# Patient Record
Sex: Male | Born: 1945
Health system: Southern US, Community
[De-identification: ages and names within clinical notes are randomized; demographics above are authoritative.]

## PROBLEM LIST (undated history)

## (undated) ENCOUNTER — Ambulatory Visit: Admission: EM | Source: Home / Self Care

## (undated) DIAGNOSIS — I491 Atrial premature depolarization: Secondary | ICD-10-CM

## (undated) DIAGNOSIS — N529 Male erectile dysfunction, unspecified: Secondary | ICD-10-CM

## (undated) DIAGNOSIS — I499 Cardiac arrhythmia, unspecified: Secondary | ICD-10-CM

## (undated) DIAGNOSIS — I219 Acute myocardial infarction, unspecified: Secondary | ICD-10-CM

## (undated) DIAGNOSIS — I10 Essential (primary) hypertension: Secondary | ICD-10-CM

## (undated) DIAGNOSIS — L570 Actinic keratosis: Secondary | ICD-10-CM

## (undated) DIAGNOSIS — H9190 Unspecified hearing loss, unspecified ear: Secondary | ICD-10-CM

## (undated) DIAGNOSIS — E785 Hyperlipidemia, unspecified: Secondary | ICD-10-CM

## (undated) DIAGNOSIS — I2699 Other pulmonary embolism without acute cor pulmonale: Secondary | ICD-10-CM

## (undated) DIAGNOSIS — C4490 Unspecified malignant neoplasm of skin, unspecified: Secondary | ICD-10-CM

## (undated) DIAGNOSIS — N400 Enlarged prostate without lower urinary tract symptoms: Secondary | ICD-10-CM

## (undated) DIAGNOSIS — K219 Gastro-esophageal reflux disease without esophagitis: Secondary | ICD-10-CM

## (undated) HISTORY — DX: Essential (primary) hypertension: I10

## (undated) HISTORY — DX: Hyperlipidemia, unspecified: E78.5

## (undated) HISTORY — DX: Benign prostatic hyperplasia without lower urinary tract symptoms: N40.0

## (undated) HISTORY — DX: Male erectile dysfunction, unspecified: N52.9

## (undated) HISTORY — DX: Actinic keratosis: L57.0

## (undated) HISTORY — DX: Unspecified hearing loss, unspecified ear: H91.90

## (undated) HISTORY — DX: Gastro-esophageal reflux disease without esophagitis: K21.9

## (undated) HISTORY — PX: INGUINAL HERNIA REPAIR: SUR1180

---

## 2001-08-29 ENCOUNTER — Encounter: Payer: Self-pay | Admitting: Orthopedic Surgery

## 2001-08-29 ENCOUNTER — Ambulatory Visit (HOSPITAL_COMMUNITY): Admission: RE | Admit: 2001-08-29 | Discharge: 2001-08-29 | Payer: Self-pay | Admitting: Orthopedic Surgery

## 2003-02-15 ENCOUNTER — Emergency Department (HOSPITAL_COMMUNITY): Admission: EM | Admit: 2003-02-15 | Discharge: 2003-02-15 | Payer: Self-pay | Admitting: Emergency Medicine

## 2006-01-17 ENCOUNTER — Encounter: Admission: RE | Admit: 2006-01-17 | Discharge: 2006-01-17 | Payer: Self-pay | Admitting: Neurosurgery

## 2007-06-06 ENCOUNTER — Encounter: Admission: RE | Admit: 2007-06-06 | Discharge: 2007-06-06 | Payer: Self-pay | Admitting: Internal Medicine

## 2011-06-14 DIAGNOSIS — I10 Essential (primary) hypertension: Secondary | ICD-10-CM | POA: Diagnosis not present

## 2011-06-14 DIAGNOSIS — E7800 Pure hypercholesterolemia, unspecified: Secondary | ICD-10-CM | POA: Diagnosis not present

## 2011-06-14 DIAGNOSIS — Z79899 Other long term (current) drug therapy: Secondary | ICD-10-CM | POA: Diagnosis not present

## 2011-06-25 DIAGNOSIS — H35039 Hypertensive retinopathy, unspecified eye: Secondary | ICD-10-CM | POA: Diagnosis not present

## 2011-07-12 DIAGNOSIS — G4700 Insomnia, unspecified: Secondary | ICD-10-CM | POA: Diagnosis not present

## 2011-07-12 DIAGNOSIS — I10 Essential (primary) hypertension: Secondary | ICD-10-CM | POA: Diagnosis not present

## 2011-09-29 DIAGNOSIS — I10 Essential (primary) hypertension: Secondary | ICD-10-CM | POA: Diagnosis not present

## 2011-09-29 DIAGNOSIS — E7800 Pure hypercholesterolemia, unspecified: Secondary | ICD-10-CM | POA: Diagnosis not present

## 2011-09-29 DIAGNOSIS — Z79899 Other long term (current) drug therapy: Secondary | ICD-10-CM | POA: Diagnosis not present

## 2011-10-01 DIAGNOSIS — H00039 Abscess of eyelid unspecified eye, unspecified eyelid: Secondary | ICD-10-CM | POA: Diagnosis not present

## 2011-10-11 DIAGNOSIS — R319 Hematuria, unspecified: Secondary | ICD-10-CM | POA: Diagnosis not present

## 2011-10-13 DIAGNOSIS — I10 Essential (primary) hypertension: Secondary | ICD-10-CM | POA: Diagnosis not present

## 2011-10-20 DIAGNOSIS — I10 Essential (primary) hypertension: Secondary | ICD-10-CM | POA: Diagnosis not present

## 2011-11-03 DIAGNOSIS — I10 Essential (primary) hypertension: Secondary | ICD-10-CM | POA: Diagnosis not present

## 2011-11-03 DIAGNOSIS — G4700 Insomnia, unspecified: Secondary | ICD-10-CM | POA: Diagnosis not present

## 2012-02-03 DIAGNOSIS — G4700 Insomnia, unspecified: Secondary | ICD-10-CM | POA: Diagnosis not present

## 2012-02-03 DIAGNOSIS — I10 Essential (primary) hypertension: Secondary | ICD-10-CM | POA: Diagnosis not present

## 2012-02-03 DIAGNOSIS — J385 Laryngeal spasm: Secondary | ICD-10-CM | POA: Diagnosis not present

## 2012-02-28 DIAGNOSIS — C44320 Squamous cell carcinoma of skin of unspecified parts of face: Secondary | ICD-10-CM | POA: Diagnosis not present

## 2012-02-28 DIAGNOSIS — L910 Hypertrophic scar: Secondary | ICD-10-CM | POA: Diagnosis not present

## 2012-02-28 DIAGNOSIS — D485 Neoplasm of uncertain behavior of skin: Secondary | ICD-10-CM | POA: Diagnosis not present

## 2012-02-28 DIAGNOSIS — L821 Other seborrheic keratosis: Secondary | ICD-10-CM | POA: Diagnosis not present

## 2012-02-28 DIAGNOSIS — L570 Actinic keratosis: Secondary | ICD-10-CM | POA: Diagnosis not present

## 2012-03-27 DIAGNOSIS — C44320 Squamous cell carcinoma of skin of unspecified parts of face: Secondary | ICD-10-CM | POA: Diagnosis not present

## 2012-06-02 DIAGNOSIS — K219 Gastro-esophageal reflux disease without esophagitis: Secondary | ICD-10-CM | POA: Diagnosis not present

## 2012-06-02 DIAGNOSIS — Z79899 Other long term (current) drug therapy: Secondary | ICD-10-CM | POA: Diagnosis not present

## 2012-06-02 DIAGNOSIS — H9190 Unspecified hearing loss, unspecified ear: Secondary | ICD-10-CM | POA: Diagnosis not present

## 2012-06-02 DIAGNOSIS — I10 Essential (primary) hypertension: Secondary | ICD-10-CM | POA: Diagnosis not present

## 2012-06-02 DIAGNOSIS — E7800 Pure hypercholesterolemia, unspecified: Secondary | ICD-10-CM | POA: Diagnosis not present

## 2012-06-02 DIAGNOSIS — M25519 Pain in unspecified shoulder: Secondary | ICD-10-CM | POA: Diagnosis not present

## 2012-06-02 DIAGNOSIS — G4700 Insomnia, unspecified: Secondary | ICD-10-CM | POA: Diagnosis not present

## 2012-06-02 DIAGNOSIS — Z0000 Encounter for general adult medical examination without abnormal findings: Secondary | ICD-10-CM | POA: Diagnosis not present

## 2012-06-02 DIAGNOSIS — Z125 Encounter for screening for malignant neoplasm of prostate: Secondary | ICD-10-CM | POA: Diagnosis not present

## 2013-06-08 ENCOUNTER — Other Ambulatory Visit: Payer: Self-pay | Admitting: Internal Medicine

## 2013-06-08 ENCOUNTER — Ambulatory Visit
Admission: RE | Admit: 2013-06-08 | Discharge: 2013-06-08 | Disposition: A | Discharge status: Home / Self Care | Payer: Medicare Other | Source: Ambulatory Visit | Attending: Internal Medicine | Admitting: Internal Medicine

## 2013-06-08 DIAGNOSIS — Z0000 Encounter for general adult medical examination without abnormal findings: Secondary | ICD-10-CM | POA: Diagnosis not present

## 2013-06-08 DIAGNOSIS — Z1331 Encounter for screening for depression: Secondary | ICD-10-CM | POA: Diagnosis not present

## 2013-06-08 DIAGNOSIS — R071 Chest pain on breathing: Secondary | ICD-10-CM | POA: Diagnosis not present

## 2013-06-08 DIAGNOSIS — E7800 Pure hypercholesterolemia, unspecified: Secondary | ICD-10-CM | POA: Diagnosis not present

## 2013-06-08 DIAGNOSIS — I10 Essential (primary) hypertension: Secondary | ICD-10-CM | POA: Diagnosis not present

## 2013-06-08 DIAGNOSIS — R0789 Other chest pain: Secondary | ICD-10-CM

## 2013-06-08 DIAGNOSIS — N400 Enlarged prostate without lower urinary tract symptoms: Secondary | ICD-10-CM | POA: Diagnosis not present

## 2013-06-08 DIAGNOSIS — N529 Male erectile dysfunction, unspecified: Secondary | ICD-10-CM | POA: Diagnosis not present

## 2013-06-08 DIAGNOSIS — K219 Gastro-esophageal reflux disease without esophagitis: Secondary | ICD-10-CM | POA: Diagnosis not present

## 2013-06-08 DIAGNOSIS — G4700 Insomnia, unspecified: Secondary | ICD-10-CM | POA: Diagnosis not present

## 2013-06-08 DIAGNOSIS — R079 Chest pain, unspecified: Secondary | ICD-10-CM | POA: Diagnosis not present

## 2013-08-08 DIAGNOSIS — J111 Influenza due to unidentified influenza virus with other respiratory manifestations: Secondary | ICD-10-CM | POA: Diagnosis not present

## 2013-08-08 DIAGNOSIS — J069 Acute upper respiratory infection, unspecified: Secondary | ICD-10-CM | POA: Diagnosis not present

## 2013-09-06 ENCOUNTER — Encounter: Payer: Self-pay | Admitting: *Deleted

## 2014-04-01 DIAGNOSIS — K645 Perianal venous thrombosis: Secondary | ICD-10-CM | POA: Diagnosis not present

## 2014-04-01 DIAGNOSIS — S335XXA Sprain of ligaments of lumbar spine, initial encounter: Secondary | ICD-10-CM | POA: Diagnosis not present

## 2014-04-01 DIAGNOSIS — N419 Inflammatory disease of prostate, unspecified: Secondary | ICD-10-CM | POA: Diagnosis not present

## 2014-10-29 DIAGNOSIS — J069 Acute upper respiratory infection, unspecified: Secondary | ICD-10-CM | POA: Diagnosis not present

## 2014-11-18 DIAGNOSIS — R062 Wheezing: Secondary | ICD-10-CM | POA: Diagnosis not present

## 2014-11-18 DIAGNOSIS — R0789 Other chest pain: Secondary | ICD-10-CM | POA: Diagnosis not present

## 2014-11-18 DIAGNOSIS — J189 Pneumonia, unspecified organism: Secondary | ICD-10-CM | POA: Diagnosis not present

## 2014-11-18 DIAGNOSIS — R05 Cough: Secondary | ICD-10-CM | POA: Diagnosis not present

## 2014-11-22 DIAGNOSIS — L570 Actinic keratosis: Secondary | ICD-10-CM | POA: Diagnosis not present

## 2014-11-22 DIAGNOSIS — J189 Pneumonia, unspecified organism: Secondary | ICD-10-CM | POA: Diagnosis not present

## 2014-11-22 DIAGNOSIS — J9801 Acute bronchospasm: Secondary | ICD-10-CM | POA: Diagnosis not present

## 2014-11-22 DIAGNOSIS — M7580 Other shoulder lesions, unspecified shoulder: Secondary | ICD-10-CM | POA: Diagnosis not present

## 2014-11-26 ENCOUNTER — Ambulatory Visit
Admission: RE | Admit: 2014-11-26 | Discharge: 2014-11-26 | Disposition: A | Discharge status: Home / Self Care | Payer: Medicare Other | Source: Ambulatory Visit | Attending: Internal Medicine | Admitting: Internal Medicine

## 2014-11-26 ENCOUNTER — Other Ambulatory Visit: Payer: Self-pay | Admitting: Internal Medicine

## 2014-11-26 DIAGNOSIS — J9801 Acute bronchospasm: Secondary | ICD-10-CM | POA: Diagnosis not present

## 2014-11-26 DIAGNOSIS — R05 Cough: Secondary | ICD-10-CM | POA: Diagnosis not present

## 2014-11-26 DIAGNOSIS — J690 Pneumonitis due to inhalation of food and vomit: Secondary | ICD-10-CM

## 2014-11-26 DIAGNOSIS — J189 Pneumonia, unspecified organism: Secondary | ICD-10-CM | POA: Diagnosis not present

## 2014-11-26 DIAGNOSIS — L570 Actinic keratosis: Secondary | ICD-10-CM | POA: Diagnosis not present

## 2014-11-26 DIAGNOSIS — M7580 Other shoulder lesions, unspecified shoulder: Secondary | ICD-10-CM | POA: Diagnosis not present

## 2015-01-14 DIAGNOSIS — M19041 Primary osteoarthritis, right hand: Secondary | ICD-10-CM | POA: Diagnosis not present

## 2015-01-14 DIAGNOSIS — M7541 Impingement syndrome of right shoulder: Secondary | ICD-10-CM | POA: Diagnosis not present

## 2015-01-14 DIAGNOSIS — M79641 Pain in right hand: Secondary | ICD-10-CM | POA: Diagnosis not present

## 2015-03-06 DIAGNOSIS — I10 Essential (primary) hypertension: Secondary | ICD-10-CM | POA: Diagnosis not present

## 2015-03-06 DIAGNOSIS — Z125 Encounter for screening for malignant neoplasm of prostate: Secondary | ICD-10-CM | POA: Diagnosis not present

## 2015-03-06 DIAGNOSIS — J9801 Acute bronchospasm: Secondary | ICD-10-CM | POA: Diagnosis not present

## 2015-03-06 DIAGNOSIS — N400 Enlarged prostate without lower urinary tract symptoms: Secondary | ICD-10-CM | POA: Diagnosis not present

## 2015-03-06 DIAGNOSIS — Z23 Encounter for immunization: Secondary | ICD-10-CM | POA: Diagnosis not present

## 2015-03-06 DIAGNOSIS — L570 Actinic keratosis: Secondary | ICD-10-CM | POA: Diagnosis not present

## 2015-04-02 DIAGNOSIS — R972 Elevated prostate specific antigen [PSA]: Secondary | ICD-10-CM | POA: Diagnosis not present

## 2015-08-29 DIAGNOSIS — L570 Actinic keratosis: Secondary | ICD-10-CM | POA: Diagnosis not present

## 2015-08-29 DIAGNOSIS — G4700 Insomnia, unspecified: Secondary | ICD-10-CM | POA: Diagnosis not present

## 2015-08-29 DIAGNOSIS — I10 Essential (primary) hypertension: Secondary | ICD-10-CM | POA: Diagnosis not present

## 2015-08-29 DIAGNOSIS — K219 Gastro-esophageal reflux disease without esophagitis: Secondary | ICD-10-CM | POA: Diagnosis not present

## 2016-02-17 DIAGNOSIS — Z7189 Other specified counseling: Secondary | ICD-10-CM | POA: Diagnosis not present

## 2016-02-17 DIAGNOSIS — H9190 Unspecified hearing loss, unspecified ear: Secondary | ICD-10-CM | POA: Diagnosis not present

## 2016-02-17 DIAGNOSIS — L570 Actinic keratosis: Secondary | ICD-10-CM | POA: Diagnosis not present

## 2016-02-17 DIAGNOSIS — Z23 Encounter for immunization: Secondary | ICD-10-CM | POA: Diagnosis not present

## 2016-02-17 DIAGNOSIS — Z6827 Body mass index (BMI) 27.0-27.9, adult: Secondary | ICD-10-CM | POA: Diagnosis not present

## 2016-02-17 DIAGNOSIS — Z0000 Encounter for general adult medical examination without abnormal findings: Secondary | ICD-10-CM | POA: Diagnosis not present

## 2016-02-17 DIAGNOSIS — Z1389 Encounter for screening for other disorder: Secondary | ICD-10-CM | POA: Diagnosis not present

## 2016-02-17 DIAGNOSIS — N400 Enlarged prostate without lower urinary tract symptoms: Secondary | ICD-10-CM | POA: Diagnosis not present

## 2016-02-17 DIAGNOSIS — K219 Gastro-esophageal reflux disease without esophagitis: Secondary | ICD-10-CM | POA: Diagnosis not present

## 2016-02-17 DIAGNOSIS — G4700 Insomnia, unspecified: Secondary | ICD-10-CM | POA: Diagnosis not present

## 2016-02-17 DIAGNOSIS — E7800 Pure hypercholesterolemia, unspecified: Secondary | ICD-10-CM | POA: Diagnosis not present

## 2016-02-17 DIAGNOSIS — Z1211 Encounter for screening for malignant neoplasm of colon: Secondary | ICD-10-CM | POA: Diagnosis not present

## 2016-02-17 DIAGNOSIS — I10 Essential (primary) hypertension: Secondary | ICD-10-CM | POA: Diagnosis not present

## 2016-02-17 DIAGNOSIS — Z79899 Other long term (current) drug therapy: Secondary | ICD-10-CM | POA: Diagnosis not present

## 2016-02-17 DIAGNOSIS — R972 Elevated prostate specific antigen [PSA]: Secondary | ICD-10-CM | POA: Diagnosis not present

## 2016-03-31 DIAGNOSIS — Z1212 Encounter for screening for malignant neoplasm of rectum: Secondary | ICD-10-CM | POA: Diagnosis not present

## 2016-03-31 DIAGNOSIS — Z1211 Encounter for screening for malignant neoplasm of colon: Secondary | ICD-10-CM | POA: Diagnosis not present

## 2016-05-14 DIAGNOSIS — F4321 Adjustment disorder with depressed mood: Secondary | ICD-10-CM | POA: Diagnosis not present

## 2016-05-14 DIAGNOSIS — K625 Hemorrhage of anus and rectum: Secondary | ICD-10-CM | POA: Diagnosis not present

## 2016-05-14 DIAGNOSIS — R1032 Left lower quadrant pain: Secondary | ICD-10-CM | POA: Diagnosis not present

## 2016-08-10 DIAGNOSIS — M546 Pain in thoracic spine: Secondary | ICD-10-CM | POA: Diagnosis not present

## 2016-08-10 DIAGNOSIS — M549 Dorsalgia, unspecified: Secondary | ICD-10-CM | POA: Diagnosis not present

## 2016-08-12 DIAGNOSIS — R091 Pleurisy: Secondary | ICD-10-CM | POA: Diagnosis not present

## 2016-08-12 DIAGNOSIS — R0781 Pleurodynia: Secondary | ICD-10-CM | POA: Diagnosis not present

## 2016-08-12 DIAGNOSIS — L570 Actinic keratosis: Secondary | ICD-10-CM | POA: Diagnosis not present

## 2016-09-22 ENCOUNTER — Encounter (INDEPENDENT_AMBULATORY_CARE_PROVIDER_SITE_OTHER): Payer: Self-pay | Admitting: Orthopedic Surgery

## 2016-09-22 ENCOUNTER — Ambulatory Visit (INDEPENDENT_AMBULATORY_CARE_PROVIDER_SITE_OTHER): Payer: Medicare Other | Admitting: Orthopedic Surgery

## 2016-09-22 ENCOUNTER — Encounter (INDEPENDENT_AMBULATORY_CARE_PROVIDER_SITE_OTHER): Payer: Self-pay

## 2016-09-22 ENCOUNTER — Ambulatory Visit (INDEPENDENT_AMBULATORY_CARE_PROVIDER_SITE_OTHER): Payer: Medicare Other

## 2016-09-22 VITALS — BP 121/84 | HR 80 | Resp 16 | Ht 69.0 in | Wt 185.0 lb

## 2016-09-22 DIAGNOSIS — M25562 Pain in left knee: Secondary | ICD-10-CM | POA: Diagnosis not present

## 2016-09-22 DIAGNOSIS — M1712 Unilateral primary osteoarthritis, left knee: Secondary | ICD-10-CM

## 2016-09-22 MED ORDER — BUPIVACAINE HCL 0.5 % IJ SOLN
3.0000 mL | INTRAMUSCULAR | Status: AC | PRN
  Administered 2016-09-22: 3 mL via INTRA_ARTICULAR

## 2016-09-22 MED ORDER — DICLOFENAC SODIUM 1 % TD GEL: 2.0000 g | Freq: Four times a day (QID) | TRANSDERMAL | 2 refills | Status: DC

## 2016-09-22 MED ORDER — METHYLPREDNISOLONE ACETATE 40 MG/ML IJ SUSP
80.0000 mg | INTRAMUSCULAR | Status: AC | PRN
  Administered 2016-09-22: 80 mg

## 2016-09-22 MED ORDER — LIDOCAINE HCL 1 % IJ SOLN
5.0000 mL | INTRAMUSCULAR | Status: AC | PRN
  Administered 2016-09-22: 5 mL

## 2016-09-22 NOTE — Progress Notes (Signed)
Office Visit Note   Patient: Raymond Stanley           Date of Birth: 1946-05-13           MRN: [id number] Visit Date: 09/22/2016              Requested by: No referring provider defined for this encounter. PCP: Pcp Not In System   Assessment & Plan: Visit Diagnoses:  1. Acute pain of left knee   2. Unilateral primary osteoarthritis, left knee     Plan:  #1: Corticosteroid injection to the left knee without difficulty #2: Limit activities today and tomorrow #3: Prescription for Voltaren gel was given  Follow-Up Instructions: Return if symptoms worsen or fail to improve.   Orders:  Orders Placed This Encounter  Procedures  . XR Knee Complete 4 Views Left   Meds ordered this encounter  Medications  . diclofenac sodium (VOLTAREN) 1 % GEL    Sig: Apply 2-4 g topically 4 (four) times daily.    Dispense:  5 Tube    Refill:  2    Order Specific Question:   Supervising Provider    Answer:   Garald Balding [[id number]]      Procedures: Large Joint Inj Date/Time: 09/22/2016 3:46 PM Performed by: Biagio Borg D Authorized by: Biagio Borg D   Consent Given by:  Patient Timeout: prior to procedure the correct patient, procedure, and site was verified   Indications:  Pain and joint swelling Location:  Knee Site:  L knee Prep: patient was prepped and draped in usual sterile fashion   Needle Size:  25 G Needle Length:  1.5 inches Approach:  Anteromedial Ultrasound Guidance: No   Fluoroscopic Guidance: No   Arthrogram: No   Medications:  5 mL lidocaine 1 %; 80 mg methylPREDNISolone acetate 40 MG/ML; 3 mL bupivacaine 0.5 % Aspiration Attempted: No   Patient tolerance:  Patient tolerated the procedure well with no immediate complications     Clinical Data: No additional findings.   Subjective: Chief Complaint  Patient presents with  . Left Knee - Pain  . Pain    nki-just started hurting, was working in shop mainly on floor, had some swelling in it, used ice  on it.  This has been going for about 2 weeks now.     HPI  Raymond Stanley is a very pleasant 71 year old white male who is seen today for evaluation of his left knee. He states that he has been working in his shop and had been doing a lot of kneeling about 2-3 weeks ago. He has been working on a project now 2 revealed a garage. He is a very active man. He states that the knee became swollen and he used Ace wrap as well as ice and has had the knee at least better control but still is having this pain in the medial joint space. He denies catching, locking, or giving way at this time. At this time is mainly pain mediated medially.  Review of Systems  Cardiovascular:       History of hypertension  All other systems reviewed and are negative.    Objective: Vital Signs: BP 121/84 (BP Location: Left Arm, Patient Position: Sitting)   Pulse 80   Resp 16   Ht 5\' 9"  (1.753 m)   Wt 185 lb (83.9 kg)   BMI 27.32 kg/m   Physical Exam  Constitutional: He is oriented to person, place, and time. He appears well-developed and well-nourished.  HENT:  Head: Normocephalic and atraumatic.  Eyes: EOM are normal. Pupils are equal, round, and reactive to light.  Pulmonary/Chest: Effort normal.  Neurological: He is alert and oriented to person, place, and time.  Skin: Skin is warm and dry.  Psychiatric: He has a normal mood and affect. His behavior is normal. Judgment and thought content normal.    Ortho Exam  He does have pain to palpation along the medial joint line. Minimal effusion at best. Little bit of crepitus with range of motion. McMurray's is not particularly painful to him. He may open a millimeter or 2 with valgus stressing.   Specialty Comments:  No specialty comments available.  Imaging: Xr Knee Complete 4 Views Left  Result Date: 09/22/2016 Three-view x-ray of the left knee reveals medial joint space narrowing at least 50-60% in comparison to the right knee. Otherwise and he appears white good  for someone his age.    PMFS History: Patient Active Problem List   Diagnosis Date Noted  . Unilateral primary osteoarthritis, left knee 09/22/2016   Past Medical History:  Diagnosis Date  . Actinic keratoses   . BPH (benign prostatic hypertrophy)   . ED (erectile dysfunction)   . Esophageal reflux   . Hearing loss   . Other and unspecified hyperlipidemia   . Unspecified essential hypertension     Family History  Problem Relation Age of Onset  . Hypertension Father   . CAD Father   . Hypertension Mother   . CAD Mother   . Hypertension Maternal Grandmother   . CAD Maternal Grandmother   . Heart disease Brother   . Hypertension Brother     Past Surgical History:  Procedure Laterality Date  . INGUINAL HERNIA REPAIR Right    Social History   Occupational History  . Not on file.   Social History Main Topics  . Smoking status: Never Smoker  . Smokeless tobacco: Never Used  . Alcohol use Yes     Comment: occasional  . Drug use: No  . Sexual activity: Not on file

## 2016-09-29 ENCOUNTER — Telehealth (INDEPENDENT_AMBULATORY_CARE_PROVIDER_SITE_OTHER): Payer: Self-pay | Admitting: Orthopedic Surgery

## 2016-09-29 NOTE — Telephone Encounter (Signed)
Patient states insurance company is requiring preauthorization for Voltaren creme.  [employer] # [phone]. Call patient to let know when authorized.

## 2016-09-29 NOTE — Telephone Encounter (Signed)
Patient says he spoke to his insurance company and the price doubled for the Voltaren gel since the rx was written for 31 days. Patient is requesting a new rx be sent to [facility] for a 90 day supply. Patient says to call him with any questions since he spoke to his insurance company.

## 2016-09-30 NOTE — Telephone Encounter (Signed)
done

## 2016-10-11 ENCOUNTER — Telehealth (INDEPENDENT_AMBULATORY_CARE_PROVIDER_SITE_OTHER): Payer: Self-pay | Admitting: Orthopaedic Surgery

## 2016-10-11 ENCOUNTER — Other Ambulatory Visit: Payer: Self-pay

## 2016-10-11 NOTE — Telephone Encounter (Signed)
Patient calling in reference to rx being faxed to [facility] for Voltaren gel 1%. Patient states rx has not been received by [facility], and that they were to refax request to [place]. Please call patient when rx faxed so he will know. This is an ongoing issue, and patient has called last week due to this.

## 2016-10-11 NOTE — Telephone Encounter (Signed)
SPoke with pt 2 x about this med. I will ask Aaron Edelman if OK for 90 day supply.. 15 tubes with 5? Refills. WIll call pt when ordered. Will need to do another prior auth.

## 2016-10-12 MED ORDER — DICLOFENAC SODIUM 1 % TD GEL: 2.0000 g | Freq: Four times a day (QID) | TRANSDERMAL | 4 refills | Status: AC

## 2016-10-12 NOTE — Telephone Encounter (Signed)
Ok to do

## 2016-10-13 ENCOUNTER — Telehealth (INDEPENDENT_AMBULATORY_CARE_PROVIDER_SITE_OTHER): Payer: Self-pay

## 2016-10-13 NOTE — Telephone Encounter (Signed)
LVMOM that I faxed refill info in on Tuesday.

## 2016-10-13 NOTE — Telephone Encounter (Signed)
Faxed in Tuesday afternoon

## 2016-10-14 ENCOUNTER — Other Ambulatory Visit (INDEPENDENT_AMBULATORY_CARE_PROVIDER_SITE_OTHER): Payer: Self-pay

## 2016-11-17 ENCOUNTER — Ambulatory Visit (INDEPENDENT_AMBULATORY_CARE_PROVIDER_SITE_OTHER): Payer: Medicare Other | Admitting: Orthopedic Surgery

## 2016-11-17 ENCOUNTER — Encounter (INDEPENDENT_AMBULATORY_CARE_PROVIDER_SITE_OTHER): Payer: Self-pay | Admitting: Orthopedic Surgery

## 2016-11-17 VITALS — BP 138/82 | HR 78 | Ht 70.0 in | Wt 180.0 lb

## 2016-11-17 DIAGNOSIS — M1712 Unilateral primary osteoarthritis, left knee: Secondary | ICD-10-CM | POA: Diagnosis not present

## 2016-11-17 DIAGNOSIS — M25562 Pain in left knee: Secondary | ICD-10-CM

## 2016-11-17 MED ORDER — DICLOFENAC SODIUM 2 % TD SOLN: 2.0000 | Freq: Two times a day (BID) | TRANSDERMAL | 3 refills | Status: AC

## 2016-11-17 MED ORDER — METHYLPREDNISOLONE ACETATE 40 MG/ML IJ SUSP
40.0000 mg | INTRAMUSCULAR | Status: AC | PRN
  Administered 2016-11-17: 40 mg via INTRA_ARTICULAR

## 2016-11-17 MED ORDER — BUPIVACAINE HCL 0.5 % IJ SOLN
3.0000 mL | INTRAMUSCULAR | Status: AC | PRN
  Administered 2016-11-17: 3 mL via INTRA_ARTICULAR

## 2016-11-17 MED ORDER — LIDOCAINE HCL 1 % IJ SOLN
5.0000 mL | INTRAMUSCULAR | Status: AC | PRN
  Administered 2016-11-17: 5 mL

## 2016-11-17 NOTE — Progress Notes (Signed)
Office Visit Note   Patient: Raymond Stanley           Date of Birth: 04/10/1946           MRN: [id number] Visit Date: 11/17/2016              Requested by: No referring provider defined for this encounter. PCP: System, Pcp Not In   Assessment & Plan: Visit Diagnoses:  1. Unilateral primary osteoarthritis, left knee   2. Left knee pain, unspecified chronicity     Plan:  #1: Corticosteroid injection to the left knee #2: Precertified for Euflex injections to left knee #3: Going to schedule an MRI scan in about a month if he is continuing to have problems we will continue with that however if he is improved then we will cancel it.  Follow-Up Instructions: No Follow-up on file.   Orders:  Orders Placed This Encounter  Procedures  . MR Knee Left w/o contrast   Meds ordered this encounter  Medications  . Diclofenac Sodium (PENNSAID) 2 % SOLN    Sig: Place 2 Squirts onto the skin 2 (two) times daily.    Dispense:  3 Bottle    Refill:  3    Order Specific Question:   Supervising Provider    Answer:   Garald Balding [[id number]]      Procedures: Large Joint Inj Date/Time: 11/17/2016 6:16 PM Performed by: Biagio Borg D Authorized by: Biagio Borg D   Consent Given by:  Patient Timeout: prior to procedure the correct patient, procedure, and site was verified   Indications:  Pain and joint swelling Location:  Knee Site:  L knee Prep: patient was prepped and draped in usual sterile fashion   Needle Size:  25 G Needle Length:  1.5 inches Approach:  Anteromedial Ultrasound Guidance: No   Fluoroscopic Guidance: No   Arthrogram: No   Medications:  5 mL lidocaine 1 %; 3 mL bupivacaine 0.5 %; 40 mg methylPREDNISolone acetate 40 MG/ML Aspiration Attempted: No   Patient tolerance:  Patient tolerated the procedure well with no immediate complications     Clinical Data: No additional findings.   Subjective: Chief Complaint  Patient presents with  . Left Knee -  Pain    [name] is a very pleasant 71 year old white male who was seen on 09/22/2016 for evaluation of his left knee. He stated that he has been working in his shop and had been doing a lot of kneeling about 2-3 weeks prior to his visit.Marland Kitchen He has been working on a project now also building a garage. He is a very active man. He states that the knee became swollen and he used Ace wrap as well as ice and has had the knee at least better control but still was having this pain in the medial joint space. He denies catching, locking, or giving way at this time. At that time is mainly pain mediated medially.  The corticosteroid injection was then given at that time and he had fairly good relief. However he started having symptoms again in his knee again at the medial aspect. He states that he is really having problems going up and down the ladder as he tries to create a new garage for his daughter. Seen today for evaluation.  Ok to do  Review of Systems  Cardiovascular:       History of hypertension  All other systems reviewed and are negative.    Objective: Vital Signs: BP 138/82  Pulse 78   Ht 5\' 10"  (1.778 m)   Wt 180 lb (81.6 kg)   BMI 25.83 kg/m   Physical Exam  Constitutional: He is oriented to person, place, and time. He appears well-developed and well-nourished.  HENT:  Head: Normocephalic and atraumatic.  Eyes: EOM are normal. Pupils are equal, round, and reactive to light.  Pulmonary/Chest: Effort normal.  Neurological: He is alert and oriented to person, place, and time.  Skin: Skin is warm and dry.  Psychiatric: He has a normal mood and affect. His behavior is normal. Judgment and thought content normal.    Ortho Exam  He does have pain to palpation along the medial joint line and at the inferior pole of the medial patellar tendon. Minimal effusion at best. Little bit of crepitus with range of motion. McMurray's is not particularly painful to him. He may open a millimeter or 2 of  opening with valgus stressing.  Specialty Comments:  No specialty comments available.  Imaging: No results found.   PMFS History: Patient Active Problem List   Diagnosis Date Noted  . Unilateral primary osteoarthritis, left knee 09/22/2016   Past Medical History:  Diagnosis Date  . Actinic keratoses   . BPH (benign prostatic hypertrophy)   . ED (erectile dysfunction)   . Esophageal reflux   . Hearing loss   . Other and unspecified hyperlipidemia   . Unspecified essential hypertension     Family History  Problem Relation Age of Onset  . Hypertension Father   . CAD Father   . Hypertension Mother   . CAD Mother   . Hypertension Maternal Grandmother   . CAD Maternal Grandmother   . Heart disease Brother   . Hypertension Brother     Past Surgical History:  Procedure Laterality Date  . INGUINAL HERNIA REPAIR Right    Social History   Occupational History  . Not on file.   Social History Main Topics  . Smoking status: Never Smoker  . Smokeless tobacco: Never Used  . Alcohol use Yes     Comment: occasional  . Drug use: No  . Sexual activity: Not on file

## 2016-11-19 ENCOUNTER — Telehealth: Payer: Self-pay | Admitting: *Deleted

## 2016-11-19 NOTE — Telephone Encounter (Signed)
Please call patient and schedule Euflexxa inj. X 3, left knee only, buy and bill, with Aaron Edelman on Wednesday's. Thank you.

## 2016-11-23 NOTE — Telephone Encounter (Signed)
IC the patient yesterday and spoke with him in regards to his injections.  He stated that he is feeling better with just using the gel that Dr. Durward Fortes prescribed for him.  He will be out of town for a couple of weeks and would like to see how he is feeling when he returns.  FYI Thank you.

## 2016-12-19 ENCOUNTER — Ambulatory Visit
Admission: RE | Admit: 2016-12-19 | Discharge: 2016-12-19 | Disposition: A | Discharge status: Home / Self Care | Payer: Medicare Other | Source: Ambulatory Visit | Attending: Orthopedic Surgery | Admitting: Orthopedic Surgery

## 2016-12-19 DIAGNOSIS — M25462 Effusion, left knee: Secondary | ICD-10-CM | POA: Diagnosis not present

## 2016-12-19 DIAGNOSIS — M25562 Pain in left knee: Secondary | ICD-10-CM

## 2016-12-22 ENCOUNTER — Other Ambulatory Visit: Payer: Self-pay | Admitting: *Deleted

## 2016-12-22 DIAGNOSIS — G8929 Other chronic pain: Secondary | ICD-10-CM

## 2016-12-22 DIAGNOSIS — M25562 Pain in left knee: Principal | ICD-10-CM

## 2017-04-06 DIAGNOSIS — J209 Acute bronchitis, unspecified: Secondary | ICD-10-CM | POA: Diagnosis not present

## 2017-07-06 ENCOUNTER — Telehealth (INDEPENDENT_AMBULATORY_CARE_PROVIDER_SITE_OTHER): Payer: Self-pay | Admitting: Orthopaedic Surgery

## 2017-07-06 NOTE — Telephone Encounter (Signed)
Patient called stating he needs a new preauthorization for his Voltaren Gel.  He stated that he has approximately 5-6 refills remaining on his current prescription and went to refill it but the pharmacy told him because it is after January 1 that he will be required to get a new preauthorization before they can refill his current prescription.  Patient stated that [facility] [address].

## 2017-07-07 NOTE — Telephone Encounter (Signed)
Yes

## 2017-07-07 NOTE — Telephone Encounter (Signed)
OK to do-

## 2017-07-08 ENCOUNTER — Telehealth: Payer: Self-pay | Admitting: Rheumatology

## 2017-07-08 ENCOUNTER — Telehealth (INDEPENDENT_AMBULATORY_CARE_PROVIDER_SITE_OTHER): Payer: Self-pay | Admitting: Orthopaedic Surgery

## 2017-07-08 NOTE — Telephone Encounter (Signed)
Patient calling in reference to Voltaren Gel. Patient states he received a call from insurance company stating rx has been declined, prior auth needed. Please call patient to discuss, and next process.

## 2017-07-08 NOTE — Telephone Encounter (Signed)
Opened in error

## 2017-07-11 NOTE — Telephone Encounter (Signed)
Sent PA to pharmacy

## 2017-07-12 ENCOUNTER — Other Ambulatory Visit (INDEPENDENT_AMBULATORY_CARE_PROVIDER_SITE_OTHER): Payer: Self-pay

## 2017-07-13 DIAGNOSIS — H532 Diplopia: Secondary | ICD-10-CM | POA: Diagnosis not present

## 2017-07-14 ENCOUNTER — Telehealth (INDEPENDENT_AMBULATORY_CARE_PROVIDER_SITE_OTHER): Payer: Self-pay | Admitting: Orthopaedic Surgery

## 2017-07-14 NOTE — Telephone Encounter (Signed)
Patient called stating that he is still waiting a return call regarding his Voltaren Gel.   Patient doesn't want to use the GoodRx card.  Patient spoke with his insurance company [employer] supplemental plan who told him that it would have been approved but the prior authorization was not done in the 72 hour time frame.

## 2017-07-15 NOTE — Telephone Encounter (Signed)
Spoke with Shanon Brow several times and he agreed to let me fill out all his RX paperwork for the appeal of Voltaren Gel

## 2017-07-18 ENCOUNTER — Other Ambulatory Visit: Payer: Self-pay | Admitting: Internal Medicine

## 2017-07-18 DIAGNOSIS — H532 Diplopia: Secondary | ICD-10-CM | POA: Diagnosis not present

## 2017-07-22 ENCOUNTER — Telehealth: Payer: Self-pay | Admitting: *Deleted

## 2017-07-22 NOTE — Telephone Encounter (Signed)
Patient called stating that he spoke with Marcie Bal on Friday 2/15 who told him that "she had everything she needed for patient to receive his medication." Patient states that Marcie Bal told him that "she filled the form out incorrectly and was going to complete the correct form and send it to the insurance company before she left for the day."  Patient waited until Wednesday, 2/20 before contacting [facility] who told him that the medication was not there.  Patient states that he has not heard from [facility] and "is very frustrated by this process."  Patient states he called and left a message with Randall Hiss.  Patient also states that he will "contact Dr. Durward Fortes directly if no one returns his calls."

## 2017-07-22 NOTE — Telephone Encounter (Signed)
I called [employer], denial for voltaren gel overturned and approved 07/20/17 until 05/30/18. I called pharmacy, re-ran RX, approved w/$40 copay. I called patient. New RX needs to be sent 10/12/17.

## 2017-07-25 ENCOUNTER — Ambulatory Visit
Admission: RE | Admit: 2017-07-25 | Discharge: 2017-07-25 | Disposition: A | Discharge status: Home / Self Care | Payer: Medicare Other | Source: Ambulatory Visit | Attending: Internal Medicine | Admitting: Internal Medicine

## 2017-07-25 DIAGNOSIS — I6523 Occlusion and stenosis of bilateral carotid arteries: Secondary | ICD-10-CM | POA: Diagnosis not present

## 2017-07-25 DIAGNOSIS — H532 Diplopia: Secondary | ICD-10-CM

## 2017-07-27 DIAGNOSIS — H532 Diplopia: Secondary | ICD-10-CM | POA: Diagnosis not present

## 2017-08-24 ENCOUNTER — Ambulatory Visit: Payer: Medicare Other | Admitting: Neurology

## 2017-10-31 DIAGNOSIS — L570 Actinic keratosis: Secondary | ICD-10-CM | POA: Diagnosis not present

## 2017-10-31 DIAGNOSIS — C44519 Basal cell carcinoma of skin of other part of trunk: Secondary | ICD-10-CM | POA: Diagnosis not present

## 2017-10-31 DIAGNOSIS — C44612 Basal cell carcinoma of skin of right upper limb, including shoulder: Secondary | ICD-10-CM | POA: Diagnosis not present

## 2017-10-31 DIAGNOSIS — Z85828 Personal history of other malignant neoplasm of skin: Secondary | ICD-10-CM | POA: Diagnosis not present

## 2017-10-31 DIAGNOSIS — L84 Corns and callosities: Secondary | ICD-10-CM | POA: Diagnosis not present

## 2017-10-31 DIAGNOSIS — L905 Scar conditions and fibrosis of skin: Secondary | ICD-10-CM | POA: Diagnosis not present

## 2017-10-31 DIAGNOSIS — D485 Neoplasm of uncertain behavior of skin: Secondary | ICD-10-CM | POA: Diagnosis not present

## 2017-10-31 DIAGNOSIS — L821 Other seborrheic keratosis: Secondary | ICD-10-CM | POA: Diagnosis not present

## 2017-11-07 DIAGNOSIS — H04123 Dry eye syndrome of bilateral lacrimal glands: Secondary | ICD-10-CM | POA: Diagnosis not present

## 2018-01-16 DIAGNOSIS — C44612 Basal cell carcinoma of skin of right upper limb, including shoulder: Secondary | ICD-10-CM | POA: Diagnosis not present

## 2018-01-16 DIAGNOSIS — C44519 Basal cell carcinoma of skin of other part of trunk: Secondary | ICD-10-CM | POA: Diagnosis not present

## 2018-01-16 DIAGNOSIS — L905 Scar conditions and fibrosis of skin: Secondary | ICD-10-CM | POA: Diagnosis not present

## 2018-01-16 DIAGNOSIS — D485 Neoplasm of uncertain behavior of skin: Secondary | ICD-10-CM | POA: Diagnosis not present

## 2018-01-16 DIAGNOSIS — C4442 Squamous cell carcinoma of skin of scalp and neck: Secondary | ICD-10-CM | POA: Diagnosis not present

## 2018-01-16 DIAGNOSIS — L821 Other seborrheic keratosis: Secondary | ICD-10-CM | POA: Diagnosis not present

## 2018-01-23 DIAGNOSIS — R0602 Shortness of breath: Secondary | ICD-10-CM | POA: Diagnosis not present

## 2018-01-23 DIAGNOSIS — N400 Enlarged prostate without lower urinary tract symptoms: Secondary | ICD-10-CM | POA: Diagnosis not present

## 2018-01-23 DIAGNOSIS — E559 Vitamin D deficiency, unspecified: Secondary | ICD-10-CM | POA: Diagnosis not present

## 2018-01-23 DIAGNOSIS — I10 Essential (primary) hypertension: Secondary | ICD-10-CM | POA: Diagnosis not present

## 2018-01-23 DIAGNOSIS — G4700 Insomnia, unspecified: Secondary | ICD-10-CM | POA: Diagnosis not present

## 2018-01-23 DIAGNOSIS — Z0001 Encounter for general adult medical examination with abnormal findings: Secondary | ICD-10-CM | POA: Diagnosis not present

## 2018-01-23 DIAGNOSIS — Z79899 Other long term (current) drug therapy: Secondary | ICD-10-CM | POA: Diagnosis not present

## 2018-01-23 DIAGNOSIS — T733XXA Exhaustion due to excessive exertion, initial encounter: Secondary | ICD-10-CM | POA: Diagnosis not present

## 2018-01-23 DIAGNOSIS — H9190 Unspecified hearing loss, unspecified ear: Secondary | ICD-10-CM | POA: Diagnosis not present

## 2018-01-23 DIAGNOSIS — E7800 Pure hypercholesterolemia, unspecified: Secondary | ICD-10-CM | POA: Diagnosis not present

## 2018-01-23 DIAGNOSIS — L570 Actinic keratosis: Secondary | ICD-10-CM | POA: Diagnosis not present

## 2018-01-23 DIAGNOSIS — Z1389 Encounter for screening for other disorder: Secondary | ICD-10-CM | POA: Diagnosis not present

## 2018-01-26 ENCOUNTER — Other Ambulatory Visit: Payer: Self-pay | Admitting: *Deleted

## 2018-01-26 ENCOUNTER — Telehealth (HOSPITAL_COMMUNITY): Payer: Self-pay

## 2018-01-26 DIAGNOSIS — R072 Precordial pain: Secondary | ICD-10-CM

## 2018-01-26 NOTE — Telephone Encounter (Signed)
Encounter complete. 

## 2018-01-27 ENCOUNTER — Telehealth (HOSPITAL_COMMUNITY): Payer: Self-pay

## 2018-01-27 ENCOUNTER — Encounter (HOSPITAL_COMMUNITY): Payer: Self-pay | Admitting: Internal Medicine

## 2018-01-27 NOTE — Telephone Encounter (Signed)
Encounter complete. 

## 2018-01-31 ENCOUNTER — Ambulatory Visit (HOSPITAL_COMMUNITY)
Admission: RE | Admit: 2018-01-31 | Discharge: 2018-01-31 | Disposition: A | Discharge status: Home / Self Care | Payer: Medicare Other | Source: Ambulatory Visit | Attending: Cardiology | Admitting: Cardiology

## 2018-01-31 DIAGNOSIS — R072 Precordial pain: Secondary | ICD-10-CM

## 2018-01-31 LAB — EXERCISE TOLERANCE TEST
CHL CUP RESTING HR STRESS: 68 {beats}/min
CSEPEDS: 31 s
CSEPEW: 10.8 METS
CSEPPHR: 164 {beats}/min
Exercise duration (min): 9 min
MPHR: 149 {beats}/min
Percent HR: 110 %
RPE: 19

## 2018-02-03 ENCOUNTER — Other Ambulatory Visit (HOSPITAL_COMMUNITY): Payer: Self-pay | Admitting: Respiratory Therapy

## 2018-02-03 DIAGNOSIS — R0602 Shortness of breath: Secondary | ICD-10-CM

## 2018-02-15 ENCOUNTER — Ambulatory Visit (HOSPITAL_COMMUNITY)
Admission: RE | Admit: 2018-02-15 | Discharge: 2018-02-15 | Disposition: A | Discharge status: Home / Self Care | Payer: Medicare Other | Source: Ambulatory Visit | Attending: Internal Medicine | Admitting: Internal Medicine

## 2018-02-15 DIAGNOSIS — R0602 Shortness of breath: Secondary | ICD-10-CM | POA: Diagnosis not present

## 2018-02-15 LAB — PULMONARY FUNCTION TEST
DL/VA % pred: 90 %
DL/VA: 4.12 ml/min/mmHg/L
DLCO UNC: 20.8 ml/min/mmHg
DLCO unc % pred: 67 %
FEF 25-75 Post: 2.32 L/sec
FEF 25-75 Pre: 1.86 L/sec
FEF2575-%CHANGE-POST: 25 %
FEF2575-%PRED-POST: 101 %
FEF2575-%Pred-Pre: 81 %
FEV1-%CHANGE-POST: 2 %
FEV1-%PRED-PRE: 79 %
FEV1-%Pred-Post: 81 %
FEV1-POST: 2.5 L
FEV1-PRE: 2.43 L
FEV1FVC-%Change-Post: 7 %
FEV1FVC-%Pred-Pre: 104 %
FEV6-%Change-Post: -2 %
FEV6-%PRED-POST: 77 %
FEV6-%PRED-PRE: 79 %
FEV6-POST: 3.06 L
FEV6-Pre: 3.15 L
FEV6FVC-%CHANGE-POST: 1 %
FEV6FVC-%PRED-PRE: 104 %
FEV6FVC-%Pred-Post: 106 %
FVC-%CHANGE-POST: -4 %
FVC-%PRED-PRE: 76 %
FVC-%Pred-Post: 73 %
FVC-POST: 3.06 L
FVC-Pre: 3.2 L
POST FEV6/FVC RATIO: 100 %
PRE FEV6/FVC RATIO: 98 %
Post FEV1/FVC ratio: 82 %
Pre FEV1/FVC ratio: 76 %
RV % PRED: 110 %
RV: 2.69 L
TLC % pred: 89 %
TLC: 6.08 L

## 2018-02-15 MED ORDER — ALBUTEROL SULFATE (2.5 MG/3ML) 0.083% IN NEBU
2.5000 mg | INHALATION_SOLUTION | Freq: Once | RESPIRATORY_TRACT | Status: AC
  Administered 2018-02-15: 2.5 mg via RESPIRATORY_TRACT

## 2018-02-20 DIAGNOSIS — D485 Neoplasm of uncertain behavior of skin: Secondary | ICD-10-CM | POA: Diagnosis not present

## 2018-02-20 DIAGNOSIS — D044 Carcinoma in situ of skin of scalp and neck: Secondary | ICD-10-CM | POA: Diagnosis not present

## 2018-02-20 DIAGNOSIS — C44622 Squamous cell carcinoma of skin of right upper limb, including shoulder: Secondary | ICD-10-CM | POA: Diagnosis not present

## 2018-02-20 DIAGNOSIS — C44319 Basal cell carcinoma of skin of other parts of face: Secondary | ICD-10-CM | POA: Diagnosis not present

## 2018-03-08 ENCOUNTER — Ambulatory Visit (INDEPENDENT_AMBULATORY_CARE_PROVIDER_SITE_OTHER): Payer: Medicare Other | Admitting: Internal Medicine

## 2018-03-08 ENCOUNTER — Encounter: Payer: Self-pay | Admitting: Internal Medicine

## 2018-03-08 VITALS — BP 114/72 | HR 59 | Ht 69.0 in | Wt 188.8 lb

## 2018-03-08 DIAGNOSIS — J849 Interstitial pulmonary disease, unspecified: Secondary | ICD-10-CM

## 2018-03-08 DIAGNOSIS — R0609 Other forms of dyspnea: Secondary | ICD-10-CM

## 2018-03-08 DIAGNOSIS — R05 Cough: Secondary | ICD-10-CM

## 2018-03-08 DIAGNOSIS — R053 Chronic cough: Secondary | ICD-10-CM

## 2018-03-08 NOTE — Progress Notes (Addendum)
OV 03/08/2018  Subjective:  Patient ID: Raymond Stanley, male , DOB: [birth year] , age 72 y.o. , MRN: [id number] , ADDRESS: [address] [place] [address]   03/08/2018 -   Chief Complaint  Patient presents with  . Pulm Consult    Referred by Dr. Josetta Huddle for dyspnea on exertion. He has had a PFT and stress test, results came back normal. Patient did pass out during PFT.      HPI Raymond Stanley 72 y.o. -listed as a non-smoker.  Referred by Dr. Inda Merlin for dyspnea and cough.  Patient reports insidious onset of dyspnea on exertion relieved by rest for the last few years.  He did admit after some detailed questioning that it is progressive.  He notices it when he climbs 2 flights of stairs.  He never used to have this problem because he is lived in the same house for 50 years.  He also notices it when he walks 300 feet in his yard.  On all occasions it is relieved by rest.  There is no associated chest pain.  There is some mild to moderate cough with mucus production that is white in color and some occasional associated wheezing for the last few years as well.  Of these the shortness of breath and cough the most progressive.  Nevertheless overall he rates his symptoms as mild to moderate.  There is no orthopnea proximal nocturnal dyspnea edema.  There is no associated connective tissue disease.  In terms of occupation: He is to work at [employer] and retired 17 years ago.  He worked there for 35 years.  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  There is no mold exposure mildew exposure  [facility] [facility] Comprehensive ILD Questionnaire  S: He does not report dyspnea at rest or for simple activities of daily living in the house.  But he does have a level 3 dyspnea while walking on a level at his own pace and level for dyspnea when walking up  stairs or walking up a hill.  He also has a cough for the last 8 months.  It is the same since it started the dyspnea is progressed.  Cough is mild and it is dry although he told me originally he had sputum.  He does have some wheezing.  Past medical: Essentially negative for asthma or COPD or connective tissue disease.  He had marked negative for acid reflux but he told me that he has acid reflux.  He has marked negative for diabetes thyroid issues stroke hepatitis tuberculosis.  He admits to 3 episodes of pneumonia there was mild.  One episode of pleurisy few years ago  Review of systems: He has associated fatigue for the last several years.  He feels very tired most of the time.  Is also chronic arthralgias.  For several decades he has had dry eyes.  Review of systems positive for acid reflux for the last several years but otherwise negative  Family history of lung disease: Denies  Exposure history negative for tobacco or vaping or marijuana use of cocaine use or intravenous drug use  Ho home and hobby details: He lives in a 72 year old [place].  Detail elicitation of  home related exposures or hobby related exposures to organic antigens was negative.  This includes living in a damp environment having mold or mildew in the house either in shower curtains or having a humidifier or using CPAP mask at night or using a nebulizer machine a steam machine or steam iron.  Negative for using a fountain in the house.  Negative for having pet hamsters or gerbils in the house.  Negative for having birds or using a feather pillow.  There is no mold in the [facility] duct.  Does not play any wind instruments.  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  He [place] negative for a dusty environment although he told me that  after he works with this tool shed he does have dust exposure  Pulmonary toxicity history: Denies except for possibly taking prednisone for unknown period of time in 2017     Simple office walk 185 feet x  3 laps goal with forehead probe 03/08/2018   O2 used Room air  Number laps completed 3  Comments about pace Good pace,   Resting Pulse Ox/HR 100% and 70/min  Final Pulse Ox/HR 97% and 94/min  Desaturated </= 88% no  Desaturated <= 3% points yes  Got Tachycardic >/= 90/min yes  Symptoms at end of test none  Miscellaneous comments Felt he could do more 3 laps easily      Lab test and data review in the past medical record -June 2016 most recent chest x-ray in the healthcare system: I personally visualized it and it is clear and agree with the radiologist report  -September 2019 [facility] protocol cardiac stress test: Reached 10.8 METS.  Negative for ischemia  -September 2019 pulmonary function test shows evidence of mild restriction on spirometry without bronchodilator response.  Total lung capacity is normal but diffusion capacity is mild to moderately reduced.  Specifically FEV1 2.43 L/79%.  FVC 3.2 L / 76% with a ratio of 76.  No bronchodilator response.  Total lung capacity 6.1 L / 89% and DLCO 20.8/67%.  Other past medical history: -He had a corticosteroid injection into his left knee for unilateral primary osteoarthritis of the left knee-June 2018    Review of Systems  Respiratory: Positive for shortness of breath.    - per HPI     has a past medical history of Actinic keratoses, BPH (benign prostatic hypertrophy), ED (erectile dysfunction), Esophageal reflux, Hearing loss, Other and unspecified hyperlipidemia, and Unspecified essential hypertension.   reports that he has never smoked. He has never used smokeless tobacco.  Past Surgical History:  Procedure Laterality Date  . INGUINAL HERNIA REPAIR Right     Allergies  Allergen Reactions  . Ace Inhibitors   .  Crestor [Rosuvastatin]   . Norvasc [Amlodipine Besylate]      There is no immunization history on file for this patient.  Family History  Problem Relation Age of Onset  . Hypertension Father   . CAD Father   . Hypertension Mother   . CAD Mother   . Hypertension Maternal Grandmother   . CAD Maternal Grandmother   . Heart disease Brother   . Hypertension Brother      Current Outpatient Medications:  .  amLODipine (NORVASC) 5 MG tablet, Take 5 mg by mouth daily., Disp: , Rfl:  .  aspirin 81 MG tablet, Take 81 mg by mouth daily., Disp: ,  Rfl:  .  diclofenac sodium (VOLTAREN) 1 % GEL, Apply 2-4 g topically 4 (four) times daily., Disp: 15 Tube, Rfl: 4 .  olmesartan-hydrochlorothiazide (BENICAR HCT) 40-25 MG per tablet, Take 1 tablet by mouth as needed. , Disp: , Rfl:  .  omeprazole (PRILOSEC) 20 MG capsule, Take 20 mg by mouth daily., Disp: , Rfl:  .  sildenafil (VIAGRA) 100 MG tablet, Take 100 mg by mouth daily as needed for erectile dysfunction., Disp: , Rfl:  .  simvastatin (ZOCOR) 80 MG tablet, Take 40 mg by mouth daily. , Disp: , Rfl:  .  TEMAZEPAM PO, Take by mouth., Disp: , Rfl:  .  zaleplon (SONATA) 10 MG capsule, Take 10 mg by mouth at bedtime as needed for sleep., Disp: , Rfl:       Objective:   Vitals:   03/08/18 0927  BP: 114/72  Pulse: (!) 59  SpO2: 95%  Weight: 188 lb 12.8 oz (85.6 kg)  Height: 5\' 9"  (1.753 m)    Estimated body mass index is 27.88 kg/m as calculated from the following:   Height as of this encounter: 5\' 9"  (1.753 m).   Weight as of this encounter: 188 lb 12.8 oz (85.6 kg).  @WEIGHTCHANGE @  [employer]   03/08/18 0927  Weight: 188 lb 12.8 oz (85.6 kg)     Physical Exam  General Appearance:    Alert, cooperative, no distress, appears stated age - yes , Deconditioned looking - no , OBESE  - no, Sitting on Wheelchair -  no  Head:    Normocephalic, without obvious abnormality, atraumatic  Eyes:    PERRL, conjunctiva/corneas clear,    Ears:    Normal TM's and external ear canals, both ears  Nose:   Nares normal, septum midline, mucosa normal, no drainage    or sinus tenderness. OXYGEN ON  - no . Patient is @ ra   Throat:   Lips, mucosa, and tongue normal; teeth and gums normal. Cyanosis on lips - no  Neck:   Supple, symmetrical, trachea midline, no adenopathy;    thyroid:  no enlargement/tenderness/nodules; no carotid   bruit or JVD  Back:     Symmetric, no curvature, ROM normal, no CVA tenderness  Lungs:     Distress - no , Wheeze no, Barrell Chest - no, Purse lip breathing - no, Crackles - possibly at lung base   Chest Wall:    No tenderness or deformity.    Heart:    Regular rate and rhythm, S1 and S2 normal, no rub   or gallop, Murmur - no  Breast Exam:    NOT DONE  Abdomen:     Soft, non-tender, bowel sounds active all four quadrants,    no masses, no organomegaly. Visceral obesity - no  Genitalia:   NOT DONE  Rectal:   NOT DONE  Extremities:   Extremities - normal, Has Cane - no, Clubbing - no, Edema - no  Pulses:   2+ and symmetric all extremities  Skin:   HAS DRY SKINS with some basal cell in forearm and white patrches on scalp and foream Stigmata of Connective Tissue Disease - STIGMATA of CONNECTIVE TISSUE DISEASE  - Distal digital fissuring (ie, "mechanic hands") - no - Distal digital tip ulceration - no -Inflammatory arthritis or polyarticular morning joint stiffness ?60 minutes - no - Palmar telangiectasia - no - Raynaud phenomenon - no - Unexplained digital edema - no - Unexplained fixed rash on the digital extensor surfaces (Gottron's  sign) - no ... - Deformities of RA - no - Scleroderma  - no - Malar Rash -  no   Lymph nodes:   Cervical, supraclavicular, and axillary nodes normal  Psychiatric:  Neurologic:   Pleasant - yes, Anxious - no, Flat affect - nop  CAm-ICU - neg, Alert and Oriented x 3 - yes, Moves all 4s - yes, Speech - normal, Cognition - intact           Assessment:        ICD-10-CM   1. Dyspnea on exertion R06.09   2. Chronic cough R05   3. Interstitial pulmonary disease ([place]) J84.9 CT Chest High Resolution   My concern is that he has interstitial lung disease.  Or at least we need to rule this out.  This is based on the fact he has the phenotype [age greater than 61 and male gender and Caucasian] for IPF the most common of the interstitial lung diseases.  This is based on his relatively dry history and the ILD questionnaire, problems of acid reflux, insidious onset of shortness of breath that is progressive, mild restriction on pulmonary function test with reduction in diffusion capacity and possible basal crackles, and a three-point drop on pulse ox with walking associated with tachycardia even though he was asymptomatic  He is somewhat concerned about doing additional testing.  He is worried about the cost of this and the burden of all this.  Also wondering about the goals of all this.  I discussed this in detail with him.  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  I counseled him that if a diagnosis of pulmonary fibrosis is made then potentially anti-fibrotic therapies can keep him functional for a longer period of time.  Based on this he is agreed to go ahead with testing.  I did advise him that additional testing at this time would start with a high-resolution CT chest.  If this indeed shows ILD then we will have to do serology.  Most of the time this is adequate and coming up with a specific ILD diagnosis such as IPF.  But sometimes we do have to proceed lung biopsy.  If this indeed is the case we will address this at follow-up.  He has verbalized understanding and has agreed to proceed with the plan.    Plan:     Patient Instructions     ICD-10-CM   1. Dyspnea on exertion R06.09   2. Chronic cough R05     Concern is that you likely or might have pulmonary fibrosis family of diseass  PLAN  please fill out [place]  ILD questionnaire in lobby and give it to staff ,03/08/2018   - it takes 20 -30 min to fill  Please  do HRCT supine and prone - either [address] or [address]  - do this or next week  Followup - will call with CT results; if shows fibrosis additional blood work and followup needed   > 50% of this > 40 min visit spent in face to face counseling or/and coordination of care - by this undersigned MD - Dr Brand Males. This includes one or more of the following documented above: discussion of test results, diagnostic or treatment recommendations, prognosis, risks and benefits of management options, instructions, education, compliance or risk-factor reduction   SIGNATURE    Dr. Brand Males, M.D., F.C.C.P,  Pulmonary and Critical Care Medicine Staff Physician, [facility] Director - Interstitial Lung Disease  Program  Pulmonary [facility] [place], [place], [address]  Pager: [phone], If no answer or between  15:00h - 7:00h: call [phone] Telephone: [phone]  10:15 AM 03/08/2018

## 2018-03-08 NOTE — Patient Instructions (Addendum)
ICD-10-CM   1. Dyspnea on exertion R06.09   2. Chronic cough R05     Concern is that you likely or might have pulmonary fibrosis family of diseass  PLAN  please fill out [place] ILD questionnaire in lobby and give it to staff ,03/08/2018   - it takes 20 -30 min to fill  Please  do HRCT supine and prone - [address] or [address]  - do this or next week  Followup - will call with CT results; if shows fibrosis additional blood work and followup needed -If no fibrosis we will have to look at asthma work-up with exam nitric oxide and blood allergy panel

## 2018-03-20 ENCOUNTER — Ambulatory Visit (INDEPENDENT_AMBULATORY_CARE_PROVIDER_SITE_OTHER)
Admission: RE | Admit: 2018-03-20 | Discharge: 2018-03-20 | Disposition: A | Discharge status: Home / Self Care | Payer: Medicare Other | Source: Ambulatory Visit | Attending: Internal Medicine | Admitting: Internal Medicine

## 2018-03-20 DIAGNOSIS — R918 Other nonspecific abnormal finding of lung field: Secondary | ICD-10-CM | POA: Diagnosis not present

## 2018-03-20 DIAGNOSIS — J849 Interstitial pulmonary disease, unspecified: Secondary | ICD-10-CM

## 2018-03-20 DIAGNOSIS — J984 Other disorders of lung: Secondary | ICD-10-CM | POA: Diagnosis not present

## 2018-03-29 ENCOUNTER — Telehealth: Payer: Self-pay | Admitting: Internal Medicine

## 2018-03-29 DIAGNOSIS — I10 Essential (primary) hypertension: Secondary | ICD-10-CM | POA: Diagnosis not present

## 2018-03-29 DIAGNOSIS — R0609 Other forms of dyspnea: Secondary | ICD-10-CM | POA: Diagnosis not present

## 2018-03-29 DIAGNOSIS — I700 Atherosclerosis of aorta: Secondary | ICD-10-CM | POA: Diagnosis not present

## 2018-03-29 DIAGNOSIS — R05 Cough: Secondary | ICD-10-CM | POA: Diagnosis not present

## 2018-03-29 NOTE — Telephone Encounter (Signed)
Called and spoke with patient he is requesting results from CT scan. Advised that we have not received them yet.   MR please advise on results, thank you.

## 2018-03-30 NOTE — Telephone Encounter (Signed)
  Let Raymond Stanley  Know   1. Sorry for delay 2. No pulm fibrosis or cancer 3. Possible mild asthma - maybe, not fully sure - > can try sample symbicort and see if it helps 4. HAs calcium deposit on heart aortic valve - did his cardiologist do an echo? Thjis valve might be tight making him dyspneic 5. Main things -big hiatal hernia - gives rise to gerd and shortness of breath - refer GI   IMPRESSION: 1. No evidence of interstitial lung disease. 2. Mild very mild air trapping indicative of mild small airways disease. 3. Aortic atherosclerosis, in addition to left main and 3 vessel coronary artery disease. Assessment for potential risk factor modification, dietary therapy or pharmacologic therapy may be warranted, if clinically indicated. 4. There are calcifications of the aortic valve. Echocardiographic correlation for evaluation of potential valvular dysfunction may be warranted if clinically indicated. 5. Moderate to large hiatal hernia. 6. Several tiny 2-4 mm subpleural nodules, strongly favored to represent benign subpleural lymph nodes. No follow-up needed if patient is low-risk (and has no known or suspected primary neoplasm). Non-contrast chest CT can be considered in 12 months if patient is high-risk. This recommendation follows the consensus statement: Guidelines for Management of Incidental Pulmonary Nodules Detected on CT Images: From the Fleischner Society 2017; Radiology 2017; 284:228-243.  Aortic Atherosclerosis (ICD10-I70.0).   Electronically Signed   By: Vinnie Langton M.D.   On: 03/21/2018 08:10   Result History

## 2018-03-30 NOTE — Telephone Encounter (Signed)
Attempted to contact pt. I did not receive an answer. I have left a message for pt to return our call.  

## 2018-03-30 NOTE — Telephone Encounter (Signed)
Pt is calling back [phone]

## 2018-03-31 NOTE — Telephone Encounter (Signed)
Attempted to contact pt. I did not receive an answer. I have left a message for pt to return our call.  

## 2018-04-04 NOTE — Telephone Encounter (Signed)
Attempted to call patient today regarding results and recommendations from MR. I did not receive an answer at time of call. I have left a voicemail message for pt to return call. X1

## 2018-04-04 NOTE — Telephone Encounter (Signed)
1. Ok for 2 samples of low dose symbicort at 2 puff bid as trial  2. Definitely refer to cardiology for co art calcificaion and calcification aortic valve  3. Definitely recommend GI for large hiatal hernia - this is most likely contributing to symptoms. Why is he rfusing?

## 2018-04-04 NOTE — Telephone Encounter (Signed)
Called and spoke to pt, and relayed below message.  Pt states he would like to hold off on referral to GI. Pt stated he does not have a cardiologist and does not recall having an echo previously.  Pt did agree to sample of Symbicort.   MR please advise on dosage of Symbicort. Thanks

## 2018-04-05 MED ORDER — BUDESONIDE-FORMOTEROL FUMARATE 160-4.5 MCG/ACT IN AERO: 1.0000 | INHALATION_SPRAY | Freq: Two times a day (BID) | RESPIRATORY_TRACT | 0 refills | Status: DC

## 2018-04-05 NOTE — Telephone Encounter (Signed)
Called and spoke to pt and relayed below message.  Pt would like OV to discuss results and next steps.  Next available with MR is 05/09/18, pt is requesting sooner appointment.  Offered sooner apt with NP. Pt prefers to see MR only.  Pt also stated that if MR would prefer deferring this to PCP, he would be okay with that as well. We do not currently have samples of Symbicort 80, only 160.   MR please advise.

## 2018-04-05 NOTE — Telephone Encounter (Signed)
Fair enough  Please give him 2 samples of 160 symbicort and he can take it 1 puff twice daily. I do not have earlier appt bcause of the move and they are sending me on hospital rotation. 05/09/18 is fine because we need to gve the symbicort approximately a month to see if is helping  LEt me know if this plan works for him or not. If not, I can see what alternative I can do

## 2018-04-05 NOTE — Telephone Encounter (Signed)
Relay below recommendations to pt.  Pt agreed to this plan Two samples of Symbicort 160 have been placed up front for pickup. Pt has been scheduled for OV on 05/09/18 with MR. Nothing further is needed.

## 2018-04-05 NOTE — Telephone Encounter (Signed)
Called patient unable to reach left message to give us a call back.

## 2018-04-06 DIAGNOSIS — H903 Sensorineural hearing loss, bilateral: Secondary | ICD-10-CM | POA: Diagnosis not present

## 2018-04-06 DIAGNOSIS — H6523 Chronic serous otitis media, bilateral: Secondary | ICD-10-CM | POA: Diagnosis not present

## 2018-04-06 DIAGNOSIS — H902 Conductive hearing loss, unspecified: Secondary | ICD-10-CM | POA: Diagnosis not present

## 2018-04-06 DIAGNOSIS — H906 Mixed conductive and sensorineural hearing loss, bilateral: Secondary | ICD-10-CM | POA: Diagnosis not present

## 2018-04-07 DIAGNOSIS — I517 Cardiomegaly: Secondary | ICD-10-CM | POA: Diagnosis not present

## 2018-04-07 DIAGNOSIS — R55 Syncope and collapse: Secondary | ICD-10-CM | POA: Diagnosis not present

## 2018-04-07 DIAGNOSIS — R0602 Shortness of breath: Secondary | ICD-10-CM | POA: Diagnosis not present

## 2018-04-07 DIAGNOSIS — I10 Essential (primary) hypertension: Secondary | ICD-10-CM | POA: Diagnosis not present

## 2018-04-07 DIAGNOSIS — I272 Pulmonary hypertension, unspecified: Secondary | ICD-10-CM | POA: Diagnosis not present

## 2018-05-05 DIAGNOSIS — D485 Neoplasm of uncertain behavior of skin: Secondary | ICD-10-CM | POA: Diagnosis not present

## 2018-05-05 DIAGNOSIS — C44629 Squamous cell carcinoma of skin of left upper limb, including shoulder: Secondary | ICD-10-CM | POA: Diagnosis not present

## 2018-05-05 DIAGNOSIS — C44622 Squamous cell carcinoma of skin of right upper limb, including shoulder: Secondary | ICD-10-CM | POA: Diagnosis not present

## 2018-05-05 DIAGNOSIS — L570 Actinic keratosis: Secondary | ICD-10-CM | POA: Diagnosis not present

## 2018-05-09 ENCOUNTER — Ambulatory Visit: Payer: Medicare Other | Admitting: Internal Medicine

## 2018-05-15 DIAGNOSIS — C44629 Squamous cell carcinoma of skin of left upper limb, including shoulder: Secondary | ICD-10-CM | POA: Diagnosis not present

## 2018-05-15 DIAGNOSIS — C44622 Squamous cell carcinoma of skin of right upper limb, including shoulder: Secondary | ICD-10-CM | POA: Diagnosis not present

## 2018-06-05 DIAGNOSIS — C44319 Basal cell carcinoma of skin of other parts of face: Secondary | ICD-10-CM | POA: Diagnosis not present

## 2018-06-19 ENCOUNTER — Telehealth: Payer: Self-pay | Admitting: Internal Medicine

## 2018-06-19 NOTE — Telephone Encounter (Signed)
Raymond Stanley was supposed to see me 05/09/18 but not sure what happened and I did not see him  Plan Please offer him first available   Thanks  MR

## 2018-06-19 NOTE — Telephone Encounter (Signed)
Pt cancelled the appt 05/08/18 and there was a note that stated that pt does not wish to reschedule appt.

## 2018-06-20 NOTE — Telephone Encounter (Signed)
Ok thanks 

## 2018-07-07 DIAGNOSIS — L570 Actinic keratosis: Secondary | ICD-10-CM | POA: Diagnosis not present

## 2018-07-07 DIAGNOSIS — C44321 Squamous cell carcinoma of skin of nose: Secondary | ICD-10-CM | POA: Diagnosis not present

## 2018-07-07 DIAGNOSIS — C44319 Basal cell carcinoma of skin of other parts of face: Secondary | ICD-10-CM | POA: Diagnosis not present

## 2018-07-07 DIAGNOSIS — C44729 Squamous cell carcinoma of skin of left lower limb, including hip: Secondary | ICD-10-CM | POA: Diagnosis not present

## 2018-07-07 DIAGNOSIS — D485 Neoplasm of uncertain behavior of skin: Secondary | ICD-10-CM | POA: Diagnosis not present

## 2018-08-01 DIAGNOSIS — C4442 Squamous cell carcinoma of skin of scalp and neck: Secondary | ICD-10-CM | POA: Diagnosis not present

## 2018-08-01 DIAGNOSIS — D485 Neoplasm of uncertain behavior of skin: Secondary | ICD-10-CM | POA: Diagnosis not present

## 2018-08-01 DIAGNOSIS — C44729 Squamous cell carcinoma of skin of left lower limb, including hip: Secondary | ICD-10-CM | POA: Diagnosis not present

## 2018-08-01 DIAGNOSIS — C44321 Squamous cell carcinoma of skin of nose: Secondary | ICD-10-CM | POA: Diagnosis not present

## 2018-08-01 DIAGNOSIS — C44319 Basal cell carcinoma of skin of other parts of face: Secondary | ICD-10-CM | POA: Diagnosis not present

## 2018-08-08 DIAGNOSIS — D485 Neoplasm of uncertain behavior of skin: Secondary | ICD-10-CM | POA: Diagnosis not present

## 2018-08-08 DIAGNOSIS — L570 Actinic keratosis: Secondary | ICD-10-CM | POA: Diagnosis not present

## 2018-08-08 DIAGNOSIS — C44629 Squamous cell carcinoma of skin of left upper limb, including shoulder: Secondary | ICD-10-CM | POA: Diagnosis not present

## 2019-03-06 DIAGNOSIS — D485 Neoplasm of uncertain behavior of skin: Secondary | ICD-10-CM | POA: Diagnosis not present

## 2019-03-06 DIAGNOSIS — C44729 Squamous cell carcinoma of skin of left lower limb, including hip: Secondary | ICD-10-CM | POA: Diagnosis not present

## 2019-03-06 DIAGNOSIS — L820 Inflamed seborrheic keratosis: Secondary | ICD-10-CM | POA: Diagnosis not present

## 2019-04-04 DIAGNOSIS — C44729 Squamous cell carcinoma of skin of left lower limb, including hip: Secondary | ICD-10-CM | POA: Diagnosis not present

## 2020-04-29 DIAGNOSIS — R091 Pleurisy: Secondary | ICD-10-CM | POA: Diagnosis not present

## 2020-04-29 DIAGNOSIS — R079 Chest pain, unspecified: Secondary | ICD-10-CM | POA: Diagnosis not present

## 2020-04-30 ENCOUNTER — Other Ambulatory Visit: Payer: Self-pay | Admitting: Internal Medicine

## 2020-04-30 ENCOUNTER — Ambulatory Visit
Admission: RE | Admit: 2020-04-30 | Discharge: 2020-04-30 | Disposition: A | Discharge status: Home / Self Care | Payer: Medicare Other | Source: Ambulatory Visit | Attending: Internal Medicine | Admitting: Internal Medicine

## 2020-04-30 DIAGNOSIS — R079 Chest pain, unspecified: Secondary | ICD-10-CM

## 2020-04-30 DIAGNOSIS — R091 Pleurisy: Secondary | ICD-10-CM

## 2020-05-05 DIAGNOSIS — Z23 Encounter for immunization: Secondary | ICD-10-CM | POA: Diagnosis not present

## 2020-05-13 ENCOUNTER — Telehealth: Payer: Self-pay | Admitting: Internal Medicine

## 2020-05-13 NOTE — Telephone Encounter (Signed)
lmtcb for pt to see if its ok to send his records to [facility].

## 2020-05-14 NOTE — Telephone Encounter (Signed)
Pt's CT has been faxed to [facility]. Nothing further needed.

## 2020-05-14 NOTE — Telephone Encounter (Signed)
Pt is giving verbal consent for [facility] to release medical records to [facility] - 05-14-20

## 2020-05-15 ENCOUNTER — Other Ambulatory Visit
Admission: RE | Admit: 2020-05-15 | Discharge: 2020-05-15 | Disposition: A | Discharge status: Home / Self Care | Payer: Medicare Other | Source: Ambulatory Visit | Attending: Pulmonary Disease | Admitting: Pulmonary Disease

## 2020-05-15 DIAGNOSIS — I10 Essential (primary) hypertension: Secondary | ICD-10-CM | POA: Diagnosis not present

## 2020-05-15 DIAGNOSIS — R0602 Shortness of breath: Secondary | ICD-10-CM | POA: Diagnosis not present

## 2020-05-15 DIAGNOSIS — Z8709 Personal history of other diseases of the respiratory system: Secondary | ICD-10-CM | POA: Diagnosis not present

## 2020-05-15 DIAGNOSIS — K219 Gastro-esophageal reflux disease without esophagitis: Secondary | ICD-10-CM | POA: Diagnosis not present

## 2020-05-15 DIAGNOSIS — N401 Enlarged prostate with lower urinary tract symptoms: Secondary | ICD-10-CM | POA: Diagnosis not present

## 2020-05-15 LAB — FIBRIN DERIVATIVES D-DIMER (ARMC ONLY): Fibrin derivatives D-dimer (ARMC): 1112.86 ng/mL (FEU) — ABNORMAL HIGH (ref 0.00–499.00)

## 2020-05-16 ENCOUNTER — Other Ambulatory Visit: Payer: Self-pay

## 2020-05-16 ENCOUNTER — Ambulatory Visit
Admission: RE | Admit: 2020-05-16 | Discharge: 2020-05-16 | Disposition: A | Discharge status: Home / Self Care | Payer: Medicare Other | Source: Ambulatory Visit | Attending: Pulmonary Disease | Admitting: Pulmonary Disease

## 2020-05-16 ENCOUNTER — Other Ambulatory Visit: Payer: Self-pay | Admitting: Pulmonary Disease

## 2020-05-16 ENCOUNTER — Other Ambulatory Visit (HOSPITAL_COMMUNITY): Payer: Self-pay | Admitting: Pulmonary Disease

## 2020-05-16 DIAGNOSIS — R7989 Other specified abnormal findings of blood chemistry: Secondary | ICD-10-CM | POA: Diagnosis not present

## 2020-05-16 DIAGNOSIS — K449 Diaphragmatic hernia without obstruction or gangrene: Secondary | ICD-10-CM | POA: Diagnosis not present

## 2020-05-16 DIAGNOSIS — R0602 Shortness of breath: Secondary | ICD-10-CM | POA: Diagnosis not present

## 2020-05-16 LAB — POCT I-STAT CREATININE: Creatinine, Ser: 1 mg/dL (ref 0.61–1.24)

## 2020-05-16 MED ORDER — IOHEXOL 350 MG/ML SOLN
75.0000 mL | Freq: Once | INTRAVENOUS | Status: AC | PRN
  Administered 2020-05-16: 75 mL via INTRAVENOUS

## 2020-06-03 DIAGNOSIS — Z23 Encounter for immunization: Secondary | ICD-10-CM | POA: Diagnosis not present

## 2020-06-03 DIAGNOSIS — I700 Atherosclerosis of aorta: Secondary | ICD-10-CM | POA: Diagnosis not present

## 2020-06-03 DIAGNOSIS — I491 Atrial premature depolarization: Secondary | ICD-10-CM | POA: Diagnosis not present

## 2020-06-03 DIAGNOSIS — R0602 Shortness of breath: Secondary | ICD-10-CM | POA: Diagnosis not present

## 2020-06-03 DIAGNOSIS — R0789 Other chest pain: Secondary | ICD-10-CM | POA: Diagnosis not present

## 2020-06-03 DIAGNOSIS — I10 Essential (primary) hypertension: Secondary | ICD-10-CM | POA: Diagnosis not present

## 2020-06-05 DIAGNOSIS — I700 Atherosclerosis of aorta: Secondary | ICD-10-CM | POA: Insufficient documentation

## 2020-06-05 DIAGNOSIS — R0789 Other chest pain: Secondary | ICD-10-CM | POA: Insufficient documentation

## 2020-07-10 ENCOUNTER — Other Ambulatory Visit: Payer: Self-pay | Admitting: Internal Medicine

## 2020-07-10 DIAGNOSIS — K449 Diaphragmatic hernia without obstruction or gangrene: Secondary | ICD-10-CM | POA: Diagnosis not present

## 2020-07-10 DIAGNOSIS — K219 Gastro-esophageal reflux disease without esophagitis: Secondary | ICD-10-CM

## 2020-07-10 DIAGNOSIS — I700 Atherosclerosis of aorta: Secondary | ICD-10-CM | POA: Diagnosis not present

## 2020-07-10 DIAGNOSIS — R0781 Pleurodynia: Secondary | ICD-10-CM | POA: Diagnosis not present

## 2020-07-10 DIAGNOSIS — R0602 Shortness of breath: Secondary | ICD-10-CM | POA: Diagnosis not present

## 2020-07-10 DIAGNOSIS — R0789 Other chest pain: Secondary | ICD-10-CM | POA: Diagnosis not present

## 2020-07-10 DIAGNOSIS — I491 Atrial premature depolarization: Secondary | ICD-10-CM | POA: Diagnosis not present

## 2020-07-14 DIAGNOSIS — R0602 Shortness of breath: Secondary | ICD-10-CM | POA: Diagnosis not present

## 2020-07-14 DIAGNOSIS — E785 Hyperlipidemia, unspecified: Secondary | ICD-10-CM | POA: Diagnosis not present

## 2020-07-14 DIAGNOSIS — I10 Essential (primary) hypertension: Secondary | ICD-10-CM | POA: Diagnosis not present

## 2020-07-14 DIAGNOSIS — I5189 Other ill-defined heart diseases: Secondary | ICD-10-CM | POA: Diagnosis not present

## 2020-07-14 DIAGNOSIS — R6889 Other general symptoms and signs: Secondary | ICD-10-CM | POA: Diagnosis not present

## 2020-07-16 ENCOUNTER — Other Ambulatory Visit: Payer: Self-pay

## 2020-07-16 ENCOUNTER — Ambulatory Visit
Admission: RE | Admit: 2020-07-16 | Discharge: 2020-07-16 | Disposition: A | Discharge status: Home / Self Care | Payer: Medicare Other | Source: Ambulatory Visit | Attending: Internal Medicine | Admitting: Internal Medicine

## 2020-07-16 DIAGNOSIS — K219 Gastro-esophageal reflux disease without esophagitis: Secondary | ICD-10-CM | POA: Diagnosis not present

## 2020-07-16 DIAGNOSIS — K449 Diaphragmatic hernia without obstruction or gangrene: Secondary | ICD-10-CM | POA: Diagnosis not present

## 2020-07-25 DIAGNOSIS — R6889 Other general symptoms and signs: Secondary | ICD-10-CM | POA: Diagnosis not present

## 2020-07-25 DIAGNOSIS — R0602 Shortness of breath: Secondary | ICD-10-CM | POA: Diagnosis not present

## 2020-07-25 DIAGNOSIS — I5189 Other ill-defined heart diseases: Secondary | ICD-10-CM | POA: Diagnosis not present

## 2020-07-30 ENCOUNTER — Ambulatory Visit: Payer: Self-pay | Admitting: Urology

## 2020-07-30 DIAGNOSIS — I700 Atherosclerosis of aorta: Secondary | ICD-10-CM | POA: Diagnosis not present

## 2020-07-30 DIAGNOSIS — N400 Enlarged prostate without lower urinary tract symptoms: Secondary | ICD-10-CM | POA: Diagnosis not present

## 2020-07-30 DIAGNOSIS — K449 Diaphragmatic hernia without obstruction or gangrene: Secondary | ICD-10-CM | POA: Diagnosis not present

## 2020-07-30 DIAGNOSIS — R0602 Shortness of breath: Secondary | ICD-10-CM | POA: Diagnosis not present

## 2020-07-30 DIAGNOSIS — K118 Other diseases of salivary glands: Secondary | ICD-10-CM | POA: Diagnosis not present

## 2020-07-30 DIAGNOSIS — Z79899 Other long term (current) drug therapy: Secondary | ICD-10-CM | POA: Diagnosis not present

## 2020-07-30 DIAGNOSIS — R590 Localized enlarged lymph nodes: Secondary | ICD-10-CM | POA: Diagnosis not present

## 2020-07-30 DIAGNOSIS — R972 Elevated prostate specific antigen [PSA]: Secondary | ICD-10-CM | POA: Diagnosis not present

## 2020-07-31 DIAGNOSIS — I10 Essential (primary) hypertension: Secondary | ICD-10-CM | POA: Diagnosis not present

## 2020-07-31 DIAGNOSIS — I700 Atherosclerosis of aorta: Secondary | ICD-10-CM | POA: Diagnosis not present

## 2020-07-31 DIAGNOSIS — I491 Atrial premature depolarization: Secondary | ICD-10-CM | POA: Diagnosis not present

## 2020-08-01 DIAGNOSIS — K219 Gastro-esophageal reflux disease without esophagitis: Secondary | ICD-10-CM | POA: Diagnosis not present

## 2020-08-06 DIAGNOSIS — R590 Localized enlarged lymph nodes: Secondary | ICD-10-CM | POA: Diagnosis not present

## 2020-08-06 DIAGNOSIS — I6523 Occlusion and stenosis of bilateral carotid arteries: Secondary | ICD-10-CM | POA: Diagnosis not present

## 2020-08-06 DIAGNOSIS — R2241 Localized swelling, mass and lump, right lower limb: Secondary | ICD-10-CM | POA: Diagnosis not present

## 2020-08-06 DIAGNOSIS — R0602 Shortness of breath: Secondary | ICD-10-CM | POA: Diagnosis not present

## 2020-08-06 DIAGNOSIS — K118 Other diseases of salivary glands: Secondary | ICD-10-CM | POA: Diagnosis not present

## 2020-08-19 ENCOUNTER — Ambulatory Visit: Payer: Medicare Other | Admitting: Internal Medicine

## 2020-08-19 DIAGNOSIS — I700 Atherosclerosis of aorta: Secondary | ICD-10-CM | POA: Diagnosis not present

## 2020-08-19 DIAGNOSIS — R0602 Shortness of breath: Secondary | ICD-10-CM | POA: Diagnosis not present

## 2020-08-19 DIAGNOSIS — R972 Elevated prostate specific antigen [PSA]: Secondary | ICD-10-CM | POA: Diagnosis not present

## 2020-08-19 DIAGNOSIS — K449 Diaphragmatic hernia without obstruction or gangrene: Secondary | ICD-10-CM | POA: Diagnosis not present

## 2020-08-19 DIAGNOSIS — Z1211 Encounter for screening for malignant neoplasm of colon: Secondary | ICD-10-CM | POA: Diagnosis not present

## 2020-08-19 DIAGNOSIS — R221 Localized swelling, mass and lump, neck: Secondary | ICD-10-CM | POA: Diagnosis not present

## 2020-08-19 DIAGNOSIS — Z0000 Encounter for general adult medical examination without abnormal findings: Secondary | ICD-10-CM | POA: Diagnosis not present

## 2020-08-19 DIAGNOSIS — N400 Enlarged prostate without lower urinary tract symptoms: Secondary | ICD-10-CM | POA: Diagnosis not present

## 2020-08-19 DIAGNOSIS — F4321 Adjustment disorder with depressed mood: Secondary | ICD-10-CM | POA: Diagnosis not present

## 2020-08-19 DIAGNOSIS — G4700 Insomnia, unspecified: Secondary | ICD-10-CM | POA: Diagnosis not present

## 2020-08-19 DIAGNOSIS — I10 Essential (primary) hypertension: Secondary | ICD-10-CM | POA: Diagnosis not present

## 2020-08-19 DIAGNOSIS — E7800 Pure hypercholesterolemia, unspecified: Secondary | ICD-10-CM | POA: Diagnosis not present

## 2020-08-19 DIAGNOSIS — L570 Actinic keratosis: Secondary | ICD-10-CM | POA: Diagnosis not present

## 2020-08-19 DIAGNOSIS — R590 Localized enlarged lymph nodes: Secondary | ICD-10-CM | POA: Diagnosis not present

## 2020-09-02 DIAGNOSIS — Z1211 Encounter for screening for malignant neoplasm of colon: Secondary | ICD-10-CM | POA: Diagnosis not present

## 2020-09-02 DIAGNOSIS — Z1212 Encounter for screening for malignant neoplasm of rectum: Secondary | ICD-10-CM | POA: Diagnosis not present

## 2020-09-06 LAB — COLOGUARD: COLOGUARD: NEGATIVE

## 2020-09-06 LAB — EXTERNAL GENERIC LAB PROCEDURE: COLOGUARD: NEGATIVE

## 2020-09-23 ENCOUNTER — Other Ambulatory Visit (HOSPITAL_COMMUNITY): Payer: Self-pay | Admitting: Unknown Physician Specialty

## 2020-09-23 ENCOUNTER — Other Ambulatory Visit: Payer: Self-pay | Admitting: Unknown Physician Specialty

## 2020-09-23 DIAGNOSIS — R221 Localized swelling, mass and lump, neck: Secondary | ICD-10-CM | POA: Diagnosis not present

## 2020-09-29 ENCOUNTER — Emergency Department
Admission: EM | Admit: 2020-09-29 | Discharge: 2020-09-29 | Disposition: A | Discharge status: Home / Self Care | Payer: Medicare Other | Attending: Student in an Organized Health Care Education/Training Program | Admitting: Student in an Organized Health Care Education/Training Program

## 2020-09-29 ENCOUNTER — Emergency Department: Payer: Medicare Other

## 2020-09-29 ENCOUNTER — Ambulatory Visit (HOSPITAL_COMMUNITY): Payer: Medicare Other

## 2020-09-29 ENCOUNTER — Other Ambulatory Visit: Payer: Self-pay

## 2020-09-29 ENCOUNTER — Encounter (HOSPITAL_COMMUNITY): Payer: Self-pay

## 2020-09-29 DIAGNOSIS — R059 Cough, unspecified: Secondary | ICD-10-CM | POA: Diagnosis not present

## 2020-09-29 DIAGNOSIS — R531 Weakness: Secondary | ICD-10-CM | POA: Diagnosis not present

## 2020-09-29 DIAGNOSIS — R110 Nausea: Secondary | ICD-10-CM | POA: Insufficient documentation

## 2020-09-29 DIAGNOSIS — Z7982 Long term (current) use of aspirin: Secondary | ICD-10-CM | POA: Diagnosis not present

## 2020-09-29 DIAGNOSIS — R55 Syncope and collapse: Secondary | ICD-10-CM | POA: Diagnosis not present

## 2020-09-29 DIAGNOSIS — Z79899 Other long term (current) drug therapy: Secondary | ICD-10-CM | POA: Insufficient documentation

## 2020-09-29 DIAGNOSIS — Z20822 Contact with and (suspected) exposure to covid-19: Secondary | ICD-10-CM | POA: Diagnosis not present

## 2020-09-29 DIAGNOSIS — R0602 Shortness of breath: Secondary | ICD-10-CM | POA: Diagnosis not present

## 2020-09-29 DIAGNOSIS — R5381 Other malaise: Secondary | ICD-10-CM | POA: Diagnosis not present

## 2020-09-29 DIAGNOSIS — J9811 Atelectasis: Secondary | ICD-10-CM | POA: Diagnosis not present

## 2020-09-29 DIAGNOSIS — K449 Diaphragmatic hernia without obstruction or gangrene: Secondary | ICD-10-CM | POA: Diagnosis not present

## 2020-09-29 DIAGNOSIS — E860 Dehydration: Secondary | ICD-10-CM | POA: Diagnosis not present

## 2020-09-29 LAB — RESP PANEL BY RT-PCR (FLU A&B, COVID) ARPGX2
Influenza A by PCR: NEGATIVE
Influenza B by PCR: NEGATIVE
SARS Coronavirus 2 by RT PCR: NEGATIVE

## 2020-09-29 LAB — CBC
HCT: 39.8 % (ref 39.0–52.0)
Hemoglobin: 13.2 g/dL (ref 13.0–17.0)
MCH: 29.9 pg (ref 26.0–34.0)
MCHC: 33.2 g/dL (ref 30.0–36.0)
MCV: 90 fL (ref 80.0–100.0)
Platelets: 131 10*3/uL — ABNORMAL LOW (ref 150–400)
RBC: 4.42 MIL/uL (ref 4.22–5.81)
RDW: 13.1 % (ref 11.5–15.5)
WBC: 4 10*3/uL (ref 4.0–10.5)
nRBC: 0 % (ref 0.0–0.2)

## 2020-09-29 LAB — URINALYSIS, COMPLETE (UACMP) WITH MICROSCOPIC
Bacteria, UA: NONE SEEN
Bilirubin Urine: NEGATIVE
Glucose, UA: NEGATIVE mg/dL
Hgb urine dipstick: NEGATIVE
Ketones, ur: 5 mg/dL — AB
Leukocytes,Ua: NEGATIVE
Nitrite: NEGATIVE
Protein, ur: NEGATIVE mg/dL
Specific Gravity, Urine: 1.008 (ref 1.005–1.030)
Squamous Epithelial / HPF: NONE SEEN (ref 0–5)
pH: 6 (ref 5.0–8.0)

## 2020-09-29 LAB — BASIC METABOLIC PANEL
Anion gap: 8 (ref 5–15)
BUN: 21 mg/dL (ref 8–23)
CO2: 26 mmol/L (ref 22–32)
Calcium: 9.1 mg/dL (ref 8.9–10.3)
Chloride: 104 mmol/L (ref 98–111)
Creatinine, Ser: 1.33 mg/dL — ABNORMAL HIGH (ref 0.61–1.24)
GFR, Estimated: 56 mL/min — ABNORMAL LOW (ref >60)
Glucose, Bld: 121 mg/dL — ABNORMAL HIGH (ref 70–99)
Potassium: 3.7 mmol/L (ref 3.5–5.1)
Sodium: 138 mmol/L (ref 135–145)

## 2020-09-29 LAB — HEPATIC FUNCTION PANEL
ALT: 16 U/L (ref 0–44)
AST: 20 U/L (ref 15–41)
Albumin: 3.7 g/dL (ref 3.5–5.0)
Alkaline Phosphatase: 76 U/L (ref 38–126)
Bilirubin, Direct: 0.1 mg/dL (ref 0.0–0.2)
Indirect Bilirubin: 0.6 mg/dL (ref 0.3–0.9)
Total Bilirubin: 0.7 mg/dL (ref 0.3–1.2)
Total Protein: 6.5 g/dL (ref 6.5–8.1)

## 2020-09-29 LAB — TROPONIN I (HIGH SENSITIVITY)
Troponin I (High Sensitivity): 9 ng/L (ref <18)
Troponin I (High Sensitivity): 9 ng/L (ref <18)
Troponin I (High Sensitivity): 8 ng/L (ref <18)

## 2020-09-29 MED ORDER — PREDNISONE 20 MG PO TABS: 40.0000 mg | ORAL_TABLET | Freq: Every day | ORAL | 0 refills | Status: AC

## 2020-09-29 MED ORDER — LORAZEPAM 2 MG/ML IJ SOLN: 1.0000 mg | INTRAMUSCULAR | Status: DC | PRN

## 2020-09-29 MED ORDER — MORPHINE SULFATE (CONCENTRATE) 10 MG/0.5ML PO SOLN
5.0000 mg | ORAL | Status: DC | PRN
Start: 2020-09-29 — End: 2020-09-29

## 2020-09-29 MED ORDER — IPRATROPIUM-ALBUTEROL 0.5-2.5 (3) MG/3ML IN SOLN
3.0000 mL | Freq: Once | RESPIRATORY_TRACT | Status: AC
  Administered 2020-09-29: 3 mL via RESPIRATORY_TRACT
  Filled 2020-09-29: qty 3

## 2020-09-29 MED ORDER — MORPHINE SULFATE (CONCENTRATE) 10 MG/0.5ML PO SOLN: 5.0000 mg | ORAL | Status: DC | PRN

## 2020-09-29 MED ORDER — IOHEXOL 350 MG/ML SOLN
75.0000 mL | Freq: Once | INTRAVENOUS | Status: AC | PRN
  Administered 2020-09-29: 75 mL via INTRAVENOUS

## 2020-09-29 MED ORDER — AZITHROMYCIN 500 MG PO TABS: 500.0000 mg | ORAL_TABLET | Freq: Every day | ORAL | 0 refills | Status: AC

## 2020-09-29 MED ORDER — LORAZEPAM 2 MG/ML PO CONC: 1.0000 mg | ORAL | Status: DC | PRN

## 2020-09-29 MED ORDER — ACETAMINOPHEN 650 MG RE SUPP: 650.0000 mg | Freq: Four times a day (QID) | RECTAL | Status: DC | PRN

## 2020-09-29 MED ORDER — ALBUTEROL SULFATE (2.5 MG/3ML) 0.083% IN NEBU
2.5000 mg | INHALATION_SOLUTION | Freq: Once | RESPIRATORY_TRACT | Status: AC
  Administered 2020-09-29: 2.5 mg via RESPIRATORY_TRACT
  Filled 2020-09-29: qty 3

## 2020-09-29 MED ORDER — ACETAMINOPHEN 325 MG PO TABS: 650.0000 mg | ORAL_TABLET | Freq: Four times a day (QID) | ORAL | Status: DC | PRN

## 2020-09-29 MED ORDER — METHYLPREDNISOLONE SODIUM SUCC 125 MG IJ SOLR
80.0000 mg | Freq: Once | INTRAMUSCULAR | Status: AC
  Administered 2020-09-29: 80 mg via INTRAVENOUS
  Filled 2020-09-29: qty 2

## 2020-09-29 MED ORDER — ALBUTEROL SULFATE HFA 108 (90 BASE) MCG/ACT IN AERS: 2.0000 | INHALATION_SPRAY | Freq: Four times a day (QID) | RESPIRATORY_TRACT | 2 refills | Status: DC | PRN

## 2020-09-29 MED ORDER — LORAZEPAM 1 MG PO TABS: 1.0000 mg | ORAL_TABLET | ORAL | Status: DC | PRN

## 2020-09-29 MED ORDER — SODIUM CHLORIDE 0.9 % IV BOLUS
1000.0000 mL | Freq: Once | INTRAVENOUS | Status: AC
  Administered 2020-09-29: 1000 mL via INTRAVENOUS

## 2020-09-29 NOTE — ED Provider Notes (Signed)
[facility] Emergency Department Provider Note    Event Date/Time   First MD Initiated Contact with Patient 09/29/20 [phone]     (approximate)  I have reviewed the triage vital signs and the nursing notes.   HISTORY  Chief Complaint Near Syncope    HPI Raymond Stanley is a 75 y.o. male with the below listed past medical history presents to the ER for evaluation of generalized malaise for the past several days associated with nonproductive cough.  States he has been feeling very rundown has not really done much outside of his house in the past few days due to feeling rundown.  Went to walk-in clinic today and while and line started feeling near syncopal.  Denies any chest pain or pressure.  Just complaint is cough.  Has had some nausea.  No new medications.    Past Medical History:  Diagnosis Date  . Actinic keratoses   . BPH (benign prostatic hypertrophy)   . ED (erectile dysfunction)   . Esophageal reflux   . Hearing loss   . Other and unspecified hyperlipidemia   . Unspecified essential hypertension    Family History  Problem Relation Age of Onset  . Hypertension Father   . CAD Father   . Hypertension Mother   . CAD Mother   . Hypertension Maternal Grandmother   . CAD Maternal Grandmother   . Heart disease Brother   . Hypertension Brother    Past Surgical History:  Procedure Laterality Date  . INGUINAL HERNIA REPAIR Right    Patient Active Problem List   Diagnosis Date Noted  . Unilateral primary osteoarthritis, left knee 09/22/2016      Prior to Admission medications   Medication Sig Start Date End Date Taking? Authorizing Provider  albuterol (VENTOLIN HFA) 108 (90 Base) MCG/ACT inhaler Inhale 2 puffs into the lungs every 6 (six) hours as needed for wheezing or shortness of breath. 09/29/20  Yes Merlyn Lot, MD  azithromycin (ZITHROMAX) 500 MG tablet Take 1 tablet (500 mg total) by mouth daily for 3 days. Take 1 tablet daily for 3  days. 09/29/20 10/02/20 Yes Merlyn Lot, MD  predniSONE (DELTASONE) 20 MG tablet Take 2 tablets (40 mg total) by mouth daily for 5 days. 09/29/20 10/04/20 Yes Merlyn Lot, MD  amLODipine (NORVASC) 5 MG tablet Take 5 mg by mouth daily.    [provider]  aspirin 81 MG tablet Take 81 mg by mouth daily.    [provider]  budesonide-formoterol (SYMBICORT) 160-4.5 MCG/ACT inhaler Inhale 1 puff into the lungs 2 (two) times daily for 1 day. 04/05/18 04/06/18  Brand Males, MD  diclofenac sodium (VOLTAREN) 1 % GEL Apply 2-4 g topically 4 (four) times daily. 10/12/16   Garald Balding, MD  olmesartan-hydrochlorothiazide (BENICAR HCT) 40-25 MG per tablet Take 1 tablet by mouth as needed.     [provider]  omeprazole (PRILOSEC) 20 MG capsule Take 20 mg by mouth daily.    [provider]  sildenafil (VIAGRA) 100 MG tablet Take 100 mg by mouth daily as needed for erectile dysfunction.    [provider]  simvastatin (ZOCOR) 80 MG tablet Take 40 mg by mouth daily.     [provider]  TEMAZEPAM PO Take by mouth.    [provider]  zaleplon (SONATA) 10 MG capsule Take 10 mg by mouth at bedtime as needed for sleep.    [provider]    Allergies Ace inhibitors, Crestor [  rosuvastatin], and Norvasc [amlodipine besylate]    Social History Social History   Tobacco Use  . Smoking status: Never Smoker  . Smokeless tobacco: Never Used  Substance Use Topics  . Alcohol use: Yes    Comment: occasional  . Drug use: No    Review of Systems Patient denies headaches, rhinorrhea, blurry vision, numbness, shortness of breath, chest pain, edema, cough, abdominal pain, nausea, vomiting, diarrhea, dysuria, fevers, rashes or hallucinations unless otherwise stated above in HPI. ____________________________________________   PHYSICAL EXAM:  VITAL SIGNS: Vitals:   09/29/20 1400 09/29/20 1500  BP: 130/77 127/66  Pulse: 65 90   Resp: 12 16  Temp:    SpO2: 98% 91%    Constitutional: Alert and oriented.  Eyes: Conjunctivae are normal.  Head: Atraumatic. Nose: No congestion/rhinnorhea. Mouth/Throat: Mucous membranes are moist.   Neck: No stridor. Painless ROM.  Cardiovascular: Normal rate, regular rhythm. Grossly normal heart sounds.  Good peripheral circulation. Respiratory: Normal respiratory effort.  No retractions. Lungs CTAB. Gastrointestinal: Soft and nontender. No distention. No abdominal bruits. No CVA tenderness. Genitourinary:  Musculoskeletal: No lower extremity tenderness trace BLE edema.  No joint effusions. Neurologic:  Normal speech and language. No gross focal neurologic deficits are appreciated. No facial droop Skin:  Skin is warm, dry and intact. No rash noted. Psychiatric: Mood and affect are normal. Speech and behavior are normal.  ____________________________________________   LABS (all labs ordered are listed, but only abnormal results are displayed)  Results for orders placed or performed during the hospital encounter of 09/29/20 (from the past 24 hour(s))  Basic metabolic panel     Status: Abnormal   Collection Time: 09/29/20  8:52 AM  Result Value Ref Range   Sodium 138 135 - 145 mmol/L   Potassium 3.7 3.5 - 5.1 mmol/L   Chloride 104 98 - 111 mmol/L   CO2 26 22 - 32 mmol/L   Glucose, Bld 121 (H) 70 - 99 mg/dL   BUN 21 8 - 23 mg/dL   Creatinine, Ser 1.33 (H) 0.61 - 1.24 mg/dL   Calcium 9.1 8.9 - 10.3 mg/dL   GFR, Estimated 56 (L) >60 mL/min   Anion gap 8 5 - 15  CBC     Status: Abnormal   Collection Time: 09/29/20  8:52 AM  Result Value Ref Range   WBC 4.0 4.0 - 10.5 K/uL   RBC 4.42 4.22 - 5.81 MIL/uL   Hemoglobin 13.2 13.0 - 17.0 g/dL   HCT 39.8 39.0 - 52.0 %   MCV 90.0 80.0 - 100.0 fL   MCH 29.9 26.0 - 34.0 pg   MCHC 33.2 30.0 - 36.0 g/dL   RDW 13.1 11.5 - 15.5 %   Platelets 131 (L) 150 - 400 K/uL   nRBC 0.0 0.0 - 0.2 %  Resp Panel by RT-PCR (Flu A&B, Covid)  Nasopharyngeal Swab     Status: None   Collection Time: 09/29/20  8:52 AM   Specimen: Nasopharyngeal Swab; Nasopharyngeal(NP) swabs in vial transport medium  Result Value Ref Range   SARS Coronavirus 2 by RT PCR NEGATIVE NEGATIVE   Influenza A by PCR NEGATIVE NEGATIVE   Influenza B by PCR NEGATIVE NEGATIVE  Hepatic function panel     Status: None   Collection Time: 09/29/20  8:52 AM  Result Value Ref Range   Total Protein 6.5 6.5 - 8.1 g/dL   Albumin 3.7 3.5 - 5.0 g/dL   AST 20 15 - 41 U/L   ALT 16 0 -  44 U/L   Alkaline Phosphatase 76 38 - 126 U/L   Total Bilirubin 0.7 0.3 - 1.2 mg/dL   Bilirubin, Direct 0.1 0.0 - 0.2 mg/dL   Indirect Bilirubin 0.6 0.3 - 0.9 mg/dL  Troponin I (High Sensitivity)     Status: None   Collection Time: 09/29/20  8:52 AM  Result Value Ref Range   Troponin I (High Sensitivity) 9 <18 ng/L  Urinalysis, Complete w Microscopic Urine, Clean Catch     Status: Abnormal   Collection Time: 09/29/20 10:49 AM  Result Value Ref Range   Color, Urine YELLOW (A) YELLOW   APPearance CLEAR (A) CLEAR   Specific Gravity, Urine 1.008 1.005 - 1.030   pH 6.0 5.0 - 8.0   Glucose, UA NEGATIVE NEGATIVE mg/dL   Hgb urine dipstick NEGATIVE NEGATIVE   Bilirubin Urine NEGATIVE NEGATIVE   Ketones, ur 5 (A) NEGATIVE mg/dL   Protein, ur NEGATIVE NEGATIVE mg/dL   Nitrite NEGATIVE NEGATIVE   Leukocytes,Ua NEGATIVE NEGATIVE   WBC, UA 0-5 0 - 5 WBC/hpf   Bacteria, UA NONE SEEN NONE SEEN   Squamous Epithelial / LPF NONE SEEN 0 - 5   Mucus PRESENT    Hyaline Casts, UA PRESENT   Troponin I (High Sensitivity)     Status: None   Collection Time: 09/29/20 12:35 PM  Result Value Ref Range   Troponin I (High Sensitivity) 9 <18 ng/L   ____________________________________________  EKG My review and personal interpretation at Time: 8:29   Indication: near syncope  Rate: 45  Rhythm: sinus Axis: normal Other: normal intervals, no  stemi ____________________________________________  RADIOLOGY  I personally reviewed all radiographic images ordered to evaluate for the above acute complaints and reviewed radiology reports and findings.  These findings were personally discussed with the patient.  Please see medical record for radiology report.  ____________________________________________   PROCEDURES  Procedure(s) performed:  Procedures    Critical Care performed: no ____________________________________________   INITIAL IMPRESSION / ASSESSMENT AND PLAN / ED COURSE  Pertinent labs & imaging results that were available during my care of the patient were reviewed by me and considered in my medical decision making (see chart for details).   DDX: dehydration, pna, chf, acs, electrolyte abn, sepsis, effusion  AMAAN MEYER is a 75 y.o. who presents to the ED with presentation as described above.  Patient nontoxic-appearing.  Describing symptoms as above.  Will give IV fluid.  Blood will be sent for the but differential.  Clinical Course as of 09/29/20 1534  Mon Sep 29, 2020  1055 Work-up thus far is reassuring does have some mild dehydration based on labs.  Patient given IV fluids feels improved.  Chest x-ray without any evidence of consolidation edema or cardiomegaly.  Troponin negative.  Daughter at bedside stating that he has been treated for asthma and bronchitis in the past.  On appreciating wheezing on exam will trial nebulizer as he does report cough.  Will observe for repeat troponin given his near syncopal episode. [PR]  1256 Reassessed patient currently stable on high flow nasal cannula.  Patient's POA has discussed with family.  Several family members are on their way to try to visit with the patient.  Plan to withdraw care at 4:00pm. [PR]  1321 WBC, UA: 0-5 [PR]  1323 Patient felt much improved after nebulizer as well as IV fluids tolerating p.o.  Work-up thus far is reassuring.  Suspect some mild  dehydration as well as probable underlying bronchitis.  Given productive  cough we will treat with azithromycin which would also provide some additional anti-inflammatory component.  Daughter states has been having a lot of sinus congestion.  Not consistent with pneumonia.  Daughter states that his this presentation is also likely worsened by the fact that he took some Delsym this morning prior to arriving at walk-in clinic.  Patient able to ambulate with steady gait no hypoxia.  Does appear stable appropriate for outpatient follow-up. [PR]  [id number] Patient noted to be fairly hypoxic with ambulation.  Will CT angiogram will give additional nebulizer as well as Solu-Medrol and continue to observe.  Patient states that he felt significantly better.  Will reassess. [PR]  1529 Reassessed.  He is able to ambulate with steady gait.  O2 sat did drop down to 89% but significantly improved from previous patient feels much better would like to be discharged.  Discussed option for observation the hospital for further monitoring.  Patient's daughter who works at [employer] clinic here with patient and feels comfortable with him going home.  I think that is reasonable showing his improvement in symptoms after treatments thus far with reassuring work-up.  We discussed strict return precautions.  Patient and family agree with plan. [PR]    Clinical Course User Index [PR] Merlyn Lot, MD    The patient was evaluated in Emergency Department today for the symptoms described in the history of present illness. He/she was evaluated in the context of the global COVID-19 pandemic, which necessitated consideration that the patient might be at risk for infection with the SARS-CoV-2 virus that causes COVID-19. Institutional protocols and algorithms that pertain to the evaluation of patients at risk for COVID-19 are in a state of rapid change based on information released by regulatory bodies including the CDC and federal and state  organizations. These policies and algorithms were followed during the patient's care in the ED.  As part of my medical decision making, I reviewed the following data within the [facility] notes reviewed and incorporated, Labs reviewed, notes from prior ED visits and [place] Controlled Substance Database   ____________________________________________   FINAL CLINICAL IMPRESSION(S) / ED DIAGNOSES  Final diagnoses:  Cough  Near syncope  Weakness      NEW MEDICATIONS STARTED DURING THIS VISIT:  New Prescriptions   ALBUTEROL (VENTOLIN HFA) 108 (90 BASE) MCG/ACT INHALER    Inhale 2 puffs into the lungs every 6 (six) hours as needed for wheezing or shortness of breath.   AZITHROMYCIN (ZITHROMAX) 500 MG TABLET    Take 1 tablet (500 mg total) by mouth daily for 3 days. Take 1 tablet daily for 3 days.   PREDNISONE (DELTASONE) 20 MG TABLET    Take 2 tablets (40 mg total) by mouth daily for 5 days.     Note:  This document was prepared using Dragon voice recognition software and may include unintentional dictation errors.    Merlyn Lot, MD 09/29/20 1534

## 2020-09-29 NOTE — ED Notes (Signed)
Provided water and graham crackers for pt to attempt to eat per EDP verbal order.

## 2020-09-29 NOTE — ED Notes (Signed)
Pt in CT. Will draw next troponin when pt returns.

## 2020-09-29 NOTE — ED Notes (Signed)
Pt to ED c/o cough and weakness. Daughter states has had dry cough for several months. Cough got worse about 3 days ago. Pt also started feeling weak over the last few days. Pt states he thinks he is dehydrated. Pt felt weak today like he was going to faint, "queasy and lightheaded". Pt in NAD at this time.

## 2020-09-29 NOTE — ED Notes (Signed)
Pt has been provided with discharge instructions. Pt denies any questions or concerns at this time. Pt verbalizes understanding for follow up care and d/c.  VSS.  Pt left department with all belongings.  

## 2020-09-29 NOTE — ED Triage Notes (Signed)
Pt comes with c/o cough for few weeks and just not feeling well. Pt states he went to walk in clinic and was standing in line when he felt like he was going to pass out. Pt states dizziness and lightheaded.  Pt states some nausea and decreased intake.

## 2020-09-29 NOTE — ED Notes (Signed)
Pt back from xray. Has not voided yet for urine specimen. Daughter at bedside. IV bolus still infusing.

## 2020-09-29 NOTE — ED Notes (Signed)
Did walking SPO2 monitoring again. Pt walked at bedside. SPO2 remained 91-94%, with one brief drop to 89% that immediately recovered to 91-92% then back to 94% on RA. EDP informed. Pt wishes to discharge home.Marland Kitchen

## 2020-09-29 NOTE — ED Notes (Signed)
Pt ambulated by side of bed with pulse ox attached. SPO2 while sitting in bed was 95%. SPO2 dropped to 80% while ambulating at bedside. Pt stopped and stood by bed. SPO2 quickly rose to 91%. SPO2 fluctuating from 88%-94% while ambulating on RA. Pt denies SOB. Pt now sitting at side of bed; SPO2 95% on RA.

## 2020-10-09 DIAGNOSIS — I10 Essential (primary) hypertension: Secondary | ICD-10-CM | POA: Diagnosis not present

## 2020-10-09 DIAGNOSIS — J411 Mucopurulent chronic bronchitis: Secondary | ICD-10-CM | POA: Diagnosis not present

## 2020-10-30 DIAGNOSIS — I700 Atherosclerosis of aorta: Secondary | ICD-10-CM | POA: Diagnosis not present

## 2020-10-30 DIAGNOSIS — I491 Atrial premature depolarization: Secondary | ICD-10-CM | POA: Diagnosis not present

## 2020-10-30 DIAGNOSIS — I5189 Other ill-defined heart diseases: Secondary | ICD-10-CM | POA: Diagnosis not present

## 2020-10-30 DIAGNOSIS — I10 Essential (primary) hypertension: Secondary | ICD-10-CM | POA: Diagnosis not present

## 2020-11-17 ENCOUNTER — Encounter (HOSPITAL_COMMUNITY): Payer: Self-pay

## 2020-11-17 ENCOUNTER — Other Ambulatory Visit: Payer: Self-pay

## 2020-11-17 ENCOUNTER — Ambulatory Visit (HOSPITAL_COMMUNITY)
Admission: RE | Admit: 2020-11-17 | Discharge: 2020-11-17 | Disposition: A | Discharge status: Home / Self Care | Payer: Medicare Other | Source: Ambulatory Visit | Attending: Unknown Physician Specialty | Admitting: Unknown Physician Specialty

## 2020-11-17 DIAGNOSIS — J341 Cyst and mucocele of nose and nasal sinus: Secondary | ICD-10-CM | POA: Diagnosis not present

## 2020-11-17 DIAGNOSIS — R221 Localized swelling, mass and lump, neck: Secondary | ICD-10-CM | POA: Diagnosis not present

## 2020-11-17 DIAGNOSIS — I6523 Occlusion and stenosis of bilateral carotid arteries: Secondary | ICD-10-CM | POA: Diagnosis not present

## 2020-11-17 DIAGNOSIS — M47812 Spondylosis without myelopathy or radiculopathy, cervical region: Secondary | ICD-10-CM | POA: Diagnosis not present

## 2020-11-17 LAB — POCT I-STAT CREATININE: Creatinine, Ser: 1 mg/dL (ref 0.61–1.24)

## 2020-11-17 MED ORDER — IOHEXOL 300 MG/ML  SOLN
75.0000 mL | Freq: Once | INTRAMUSCULAR | Status: AC | PRN
  Administered 2020-11-17: 75 mL via INTRAVENOUS

## 2020-11-17 MED ORDER — SODIUM CHLORIDE (PF) 0.9 % IJ SOLN
INTRAMUSCULAR | Status: AC
  Filled 2020-11-17: qty 50

## 2020-11-21 ENCOUNTER — Other Ambulatory Visit: Payer: Self-pay | Admitting: Unknown Physician Specialty

## 2020-11-21 DIAGNOSIS — K119 Disease of salivary gland, unspecified: Secondary | ICD-10-CM

## 2020-11-26 ENCOUNTER — Ambulatory Visit: Payer: Medicare Other

## 2020-12-08 ENCOUNTER — Other Ambulatory Visit: Payer: Self-pay | Admitting: Physician Assistant

## 2020-12-08 DIAGNOSIS — Z20822 Contact with and (suspected) exposure to covid-19: Secondary | ICD-10-CM | POA: Diagnosis not present

## 2020-12-09 ENCOUNTER — Ambulatory Visit
Admission: RE | Admit: 2020-12-09 | Discharge: 2020-12-09 | Disposition: A | Discharge status: Home / Self Care | Payer: Medicare Other | Source: Ambulatory Visit | Attending: Unknown Physician Specialty | Admitting: Unknown Physician Specialty

## 2020-12-09 ENCOUNTER — Other Ambulatory Visit: Payer: Self-pay

## 2020-12-09 DIAGNOSIS — K119 Disease of salivary gland, unspecified: Secondary | ICD-10-CM | POA: Diagnosis not present

## 2020-12-09 DIAGNOSIS — R590 Localized enlarged lymph nodes: Secondary | ICD-10-CM | POA: Diagnosis not present

## 2020-12-09 NOTE — Procedures (Signed)
Interventional Radiology Procedure Note  Procedure: [place] RT PAROTID NODE CORE BX    Complications: None  Estimated Blood Loss:  MIN  Findings: 18 G CORES, FRAGMENTED STRANDY   Tamera Punt, MD

## 2020-12-11 LAB — SURGICAL PATHOLOGY

## 2020-12-15 DIAGNOSIS — T162XXA Foreign body in left ear, initial encounter: Secondary | ICD-10-CM | POA: Diagnosis not present

## 2020-12-15 DIAGNOSIS — R221 Localized swelling, mass and lump, neck: Secondary | ICD-10-CM | POA: Diagnosis not present

## 2020-12-24 DIAGNOSIS — I10 Essential (primary) hypertension: Secondary | ICD-10-CM | POA: Diagnosis not present

## 2020-12-24 DIAGNOSIS — Z79899 Other long term (current) drug therapy: Secondary | ICD-10-CM | POA: Diagnosis not present

## 2020-12-24 DIAGNOSIS — I700 Atherosclerosis of aorta: Secondary | ICD-10-CM | POA: Diagnosis not present

## 2020-12-24 DIAGNOSIS — I5189 Other ill-defined heart diseases: Secondary | ICD-10-CM | POA: Diagnosis not present

## 2020-12-24 DIAGNOSIS — I491 Atrial premature depolarization: Secondary | ICD-10-CM | POA: Diagnosis not present

## 2020-12-29 DIAGNOSIS — H903 Sensorineural hearing loss, bilateral: Secondary | ICD-10-CM | POA: Diagnosis not present

## 2021-01-05 DIAGNOSIS — L723 Sebaceous cyst: Secondary | ICD-10-CM | POA: Diagnosis not present

## 2021-02-11 DIAGNOSIS — K219 Gastro-esophageal reflux disease without esophagitis: Secondary | ICD-10-CM | POA: Diagnosis not present

## 2021-02-11 DIAGNOSIS — Z79899 Other long term (current) drug therapy: Secondary | ICD-10-CM | POA: Diagnosis not present

## 2021-03-18 DIAGNOSIS — Z1152 Encounter for screening for COVID-19: Secondary | ICD-10-CM | POA: Diagnosis not present

## 2021-03-26 DIAGNOSIS — I700 Atherosclerosis of aorta: Secondary | ICD-10-CM | POA: Diagnosis not present

## 2021-03-26 DIAGNOSIS — I5189 Other ill-defined heart diseases: Secondary | ICD-10-CM | POA: Diagnosis not present

## 2021-03-26 DIAGNOSIS — I10 Essential (primary) hypertension: Secondary | ICD-10-CM | POA: Diagnosis not present

## 2021-03-26 DIAGNOSIS — I491 Atrial premature depolarization: Secondary | ICD-10-CM | POA: Diagnosis not present

## 2021-04-10 DIAGNOSIS — L723 Sebaceous cyst: Secondary | ICD-10-CM | POA: Diagnosis not present

## 2021-04-10 DIAGNOSIS — R221 Localized swelling, mass and lump, neck: Secondary | ICD-10-CM | POA: Diagnosis not present

## 2021-04-27 DIAGNOSIS — L723 Sebaceous cyst: Secondary | ICD-10-CM | POA: Diagnosis not present

## 2021-05-01 ENCOUNTER — Ambulatory Visit: Payer: Self-pay | Admitting: Anesthesiology

## 2021-05-01 ENCOUNTER — Encounter: Payer: Self-pay | Admitting: Anesthesiology

## 2021-05-01 ENCOUNTER — Encounter: Payer: Self-pay | Admitting: Unknown Physician Specialty

## 2021-05-08 ENCOUNTER — Encounter: Payer: Self-pay | Admitting: Unknown Physician Specialty

## 2021-05-08 ENCOUNTER — Encounter
Admission: RE | Disposition: A | Discharge status: Home / Self Care | Payer: Self-pay | Source: Home / Self Care | Attending: Unknown Physician Specialty

## 2021-05-08 ENCOUNTER — Other Ambulatory Visit: Payer: Self-pay

## 2021-05-08 ENCOUNTER — Ambulatory Visit
Admission: RE | Admit: 2021-05-08 | Discharge: 2021-05-08 | Disposition: A | Discharge status: Home / Self Care | Payer: Medicare Other | Attending: Unknown Physician Specialty | Admitting: Unknown Physician Specialty

## 2021-05-08 DIAGNOSIS — I10 Essential (primary) hypertension: Secondary | ICD-10-CM | POA: Insufficient documentation

## 2021-05-08 DIAGNOSIS — C4440 Unspecified malignant neoplasm of skin of scalp and neck: Secondary | ICD-10-CM | POA: Diagnosis not present

## 2021-05-08 DIAGNOSIS — D044 Carcinoma in situ of skin of scalp and neck: Secondary | ICD-10-CM | POA: Diagnosis not present

## 2021-05-08 DIAGNOSIS — R221 Localized swelling, mass and lump, neck: Secondary | ICD-10-CM | POA: Diagnosis not present

## 2021-05-08 HISTORY — DX: Unspecified malignant neoplasm of skin, unspecified: C44.90

## 2021-05-08 HISTORY — DX: Atrial premature depolarization: I49.1

## 2021-05-08 HISTORY — DX: Other pulmonary embolism without acute cor pulmonale: I26.99

## 2021-05-08 HISTORY — PX: EXCISION MASS NECK: SHX6703

## 2021-05-08 SURGERY — EXCISION, MASS, NECK
Anesthesia: LOCAL | Site: Neck | Laterality: Right

## 2021-05-08 MED ORDER — LIDOCAINE-EPINEPHRINE 1 %-1:100000 IJ SOLN
INTRAMUSCULAR | Status: DC | PRN
  Administered 2021-05-08: 3 mL

## 2021-05-08 MED ORDER — BACITRACIN 500 UNIT/GM EX OINT
TOPICAL_OINTMENT | CUTANEOUS | Status: DC | PRN
  Administered 2021-05-08: 1 via TOPICAL

## 2021-05-08 SURGICAL SUPPLY — 18 items
CORD BIP STRL DISP 12FT (MISCELLANEOUS) ×1 IMPLANT
DRAPE HEAD BAR (DRAPES) ×2 IMPLANT
GLOVE SURG ENC TEXT LTX SZ7.5 (GLOVE) ×4 IMPLANT
GOWN STRL REUS W/ TWL LRG LVL3 (GOWN DISPOSABLE) ×2 IMPLANT
GOWN STRL REUS W/TWL LRG LVL3 (GOWN DISPOSABLE) ×2
KIT TURNOVER KIT A (KITS) ×2 IMPLANT
NDL HYPO 25GX1X1/2 BEV (NEEDLE) ×1 IMPLANT
NEEDLE HYPO 25GX1X1/2 BEV (NEEDLE) ×2 IMPLANT
NS IRRIG 500ML POUR BTL (IV SOLUTION) ×2 IMPLANT
PACK ENT CUSTOM (PACKS) ×2 IMPLANT
SOL PREP PVP 2OZ (MISCELLANEOUS) ×2
SOLUTION PREP PVP 2OZ (MISCELLANEOUS) ×1 IMPLANT
SPONGE KITTNER 5P (MISCELLANEOUS) ×2 IMPLANT
SUT PROLENE 5 0 P 3 (SUTURE) ×1 IMPLANT
SUT PROLENE 5 0 PS 3 (SUTURE) IMPLANT
SUT VIC AB 4-0 RB1 18 (SUTURE) ×1 IMPLANT
SYR 10ML LL (SYRINGE) ×2 IMPLANT
TOWEL OR 17X26 4PK STRL BLUE (TOWEL DISPOSABLE) ×2 IMPLANT

## 2021-05-08 NOTE — Op Note (Signed)
05/08/2021  11:00 AM    Raymond Stanley  [id number]   Pre-Op Dx: NECK MASS  Post-op Dx: SAME  Proc: Excision of right infra-auricular cystic mass approximately 1-1/2 x 2 cm  Surg:  Roena Malady  Anes:  GOT  EBL: Less than 5 cc  Comp: None  Findings: Cystic mass just beneath the earlobe on the right  Procedure: [name] was identified in the holding area taken the operating placed in supine position.  The table was turned 90 degrees.  The right ear and upper neck were prepped sterilely.  A local anesthetic of 1% lidocaine with 1 1000's epinephrine was used to inject over the cystic mass approximately 2 cc was used.  Elliptical incision was drawn around the cystic area.  A 15 blade was used to incise down to and into the subcutaneous tissues.  Short sharp scissors were then used to excise the cystic mass down to the SMAS.  The cyst was excised in its entirety.  This included the overlying skin.  There were several small bleeding areas were cauterized using the microbipolar.  With no active bleeding the subcutaneous tissues were closed using 5-0 Vicryl and the skin was closed using interrupted 5-0 Prolene.  The wound was then cleaned and bacitracin ointment was applied.  Dispo:   Good  Plan: Patient will be discharged home.  He forgot to take his oral hypertensive medicine, his blood pressure was slightly elevated with his diastolic pressure around 272.  We watched him in the PACU where the diastolic was down into the 92 range systolic [phone].  I have instructed him to immediately take his medication when he gets home and to check his blood pressure after he is take his medication to be sure that it remains low and stable.  If it does not he is to contact his primary care physician immediately.  I will follow-up with him in 10 days for suture removal.  Roena Malady  05/08/2021 11:00 AM

## 2021-05-08 NOTE — OR Nursing (Signed)
1026 BP: 201/98 P: 71 R: 18 *Surgeon notified of elevated BP, pt stated "he forgot to take his BP medication this morning", MD decided to proceed with planned surgery.  1030 BP: 1936/116 P: 75  1035 BP: 175/99 P: 72  1040 BP: 168/102 P: 71  1045 BP: 179/103 P: 66 R: 17

## 2021-05-08 NOTE — Progress Notes (Addendum)
1310: BP elevated preoperatively and throughout course of procedure and recovery (pt had not taken AM BP meds). SBP 170s and DBP 90s in PACU and per Dr. Tami Ribas, [place] to discharge patient home with instructions to take home medications and recheck BP. Per Dr. Reggy Eye request, called patient this afternoon to check on BP and he reported that 30 mins after taking his medication, his BP was 154/96. Pt verbalizes he would continue to monitor his BP and follow up with his PCP accordingly. This information was relayed to Dr. Tami Ribas.

## 2021-05-08 NOTE — H&P (Signed)
The patient's history has been reviewed, patient examined, no change in status, stable for surgery.  Questions were answered to the patients satisfaction.  

## 2021-05-11 ENCOUNTER — Encounter: Payer: Self-pay | Admitting: Unknown Physician Specialty

## 2021-05-14 DIAGNOSIS — C4442 Squamous cell carcinoma of skin of scalp and neck: Secondary | ICD-10-CM | POA: Diagnosis not present

## 2021-06-02 LAB — SURGICAL PATHOLOGY

## 2021-06-30 DIAGNOSIS — I700 Atherosclerosis of aorta: Secondary | ICD-10-CM | POA: Diagnosis not present

## 2021-06-30 DIAGNOSIS — I491 Atrial premature depolarization: Secondary | ICD-10-CM | POA: Diagnosis not present

## 2021-06-30 DIAGNOSIS — I5189 Other ill-defined heart diseases: Secondary | ICD-10-CM | POA: Diagnosis not present

## 2021-06-30 DIAGNOSIS — I10 Essential (primary) hypertension: Secondary | ICD-10-CM | POA: Diagnosis not present

## 2021-06-30 DIAGNOSIS — Z23 Encounter for immunization: Secondary | ICD-10-CM | POA: Diagnosis not present

## 2021-07-22 ENCOUNTER — Ambulatory Visit (INDEPENDENT_AMBULATORY_CARE_PROVIDER_SITE_OTHER): Payer: Medicare Other | Admitting: Physician Assistant

## 2021-07-22 ENCOUNTER — Encounter (INDEPENDENT_AMBULATORY_CARE_PROVIDER_SITE_OTHER): Payer: Self-pay

## 2021-07-22 ENCOUNTER — Encounter: Payer: Self-pay | Admitting: Physician Assistant

## 2021-07-22 ENCOUNTER — Other Ambulatory Visit: Payer: Self-pay

## 2021-07-22 VITALS — BP 137/86 | HR 68 | Temp 98.7°F | Resp 16 | Wt 187.0 lb

## 2021-07-22 DIAGNOSIS — L299 Pruritus, unspecified: Secondary | ICD-10-CM | POA: Diagnosis not present

## 2021-07-22 DIAGNOSIS — L239 Allergic contact dermatitis, unspecified cause: Secondary | ICD-10-CM | POA: Diagnosis not present

## 2021-07-22 MED ORDER — HYDROXYZINE HCL 10 MG PO TABS: 10.0000 mg | ORAL_TABLET | Freq: Three times a day (TID) | ORAL | 0 refills | Status: DC | PRN

## 2021-07-22 MED ORDER — TRIAMCINOLONE ACETONIDE 0.5 % EX OINT: 1.0000 "application " | TOPICAL_OINTMENT | Freq: Two times a day (BID) | CUTANEOUS | 0 refills | Status: DC

## 2021-07-22 NOTE — Progress Notes (Signed)
AM   Using carac from a previous prescription    Noticed facial swelling and rash on chest

## 2021-07-22 NOTE — Patient Instructions (Signed)
You are going to use a Kenalog steroid cream twice a day until your rash resolves.  You can use the hydroxyzine every 8 hours to help you with the itching, be very mindful about using this medication, it can make you drowsy.  I hope that you feel better soon, please let us know if there is anything else we can do for you  Kennieth Rad, PA-C Physician Assistant [facility] [email or web address]   Rash, Adult A rash is a change in the color of your skin. A rash can also change the way your skin feels. There are many different conditions and factors that can cause a rash. Some rashes may disappear after a few days, but some may last for a few weeks. Common causes of rashes include: Viral infections, such as: Colds. Measles. Hand, foot, and mouth disease. Bacterial infections, such as: Scarlet fever. Impetigo. Fungal infections, such as Candida. Allergic reactions to food, medicines, or skin care products. Follow these instructions at home: The goal of treatment is to stop the itching and keep the rash from spreading. Pay attention to any changes in your symptoms. Follow these instructions to help with your condition: Medicine Take or apply over-the-counter and prescription medicines only as told by your health care provider. These may include: Corticosteroid creams to treat red or swollen skin. Anti-itch lotions. Oral allergy medicines (antihistamines). Oral corticosteroids for severe symptoms.  Skin care Apply cool compresses to the affected areas. Do not scratch or rub your skin. Avoid covering the rash. Make sure the rash is exposed to air as much as possible. Managing itching and discomfort Avoid hot showers or baths, which can make itching worse. A cold shower may help. Try taking a bath with: Epsom salts. Follow manufacturer instructions on the packaging. You can get these at your local pharmacy or grocery store. Baking  soda. Pour a small amount into the bath as told by your health care provider. Colloidal oatmeal. Follow manufacturer instructions on the packaging. You can get this at your local pharmacy or grocery store. Try applying baking soda paste to your skin. Stir water into baking soda until it reaches a paste-like consistency. Try applying calamine lotion. This is an over-the-counter lotion that helps to relieve itchiness. Keep cool and out of the sun. Sweating and being hot can make itching worse. General instructions  Rest as needed. Drink enough fluid to keep your urine pale yellow. Wear loose-fitting clothing. Avoid scented soaps, detergents, and perfumes. Use gentle soaps, detergents, perfumes, and other cosmetic products. Avoid any substance that causes your rash. Keep a journal to help track what causes your rash. Write down: What you eat. What cosmetic products you use. What you drink. What you wear. This includes jewelry. Keep all follow-up visits as told by your health care provider. This is important. Contact a health care provider if: You sweat at night. You lose weight. You urinate more than normal. You urinate less than normal, or you notice that your urine is a darker color than usual. You feel weak. You vomit. Your skin or the whites of your eyes look yellow (jaundice). Your skin: Tingles. Is numb. Your rash: Does not go away after several days. Gets worse. You are: Unusually thirsty. More tired than normal. You have: New symptoms. Pain in your abdomen. A fever. Diarrhea. Get help right away if you: Have a fever and your symptoms suddenly get worse. Develop confusion. Have a severe headache or a stiff neck. Have severe joint pains or  stiffness. Have a seizure. Develop a rash that covers all or most of your body. The rash may or may not be painful. Develop blisters that: Are on top of the rash. Grow larger or grow together. Are painful. Are inside your nose  or mouth. Develop a rash that: Looks like purple pinprick-sized spots all over your body. Has a "bull's eye" or looks like a target. Is not related to sun exposure, is red and painful, and causes your skin to peel. Summary A rash is a change in the color of your skin. Some rashes disappear after a few days, but some may last for a few weeks. The goal of treatment is to stop the itching and keep the rash from spreading. Take or apply over-the-counter and prescription medicines only as told by your health care provider. Contact a health care provider if you have new or worsening symptoms. Keep all follow-up visits as told by your health care provider. This is important. This information is not intended to replace advice given to you by your health care provider. Make sure you discuss any questions you have with your health care provider. Document Revised: 09/08/2018 Document Reviewed: 12/19/2017 Elsevier Patient Education  2022 [employer].

## 2021-07-22 NOTE — Progress Notes (Signed)
Using carac from a previous prescription

## 2021-07-22 NOTE — Progress Notes (Signed)
New Patient Office Visit  Subjective:  Patient ID: Raymond Stanley, male    DOB: March 31, 1946  Age: 76 y.o. MRN: [id number]  CC:  Chief Complaint  Patient presents with   Rash    HPI Raymond Stanley reports that he started having a rash, and bilateral eye swelling approximately 5 days ago.  States that he had a "really old" tube of  carac (fluorouracil cream) that he previously was prescribed by a dermatologist.  Does endorse that he had allergic reactions to this cream in the past but decided to try again to see if it would work this time.  States that he was concerned about the "age spots on his forehead and arms".  States that shortly after starting it both of his eyelids became swollen, states that this has since resolved.  States that he started noticing a rash on his arms and on his chest as well as some spots on his torso and upper back.  Reports the rash is itchy, has used over-the-counter moisturizing creams as well as rubbing alcohol without relief.  States that he has tried Benadryl to help with the itching without relief.  Denies any other new fragrances, body washes, detergents, lotions, medications.  States that he has not continue to use this product since allergic reaction symptoms began.  States that he does have a follow-up with dermatology coming up, however it is not until May 2023.  Past Medical History:  Diagnosis Date   Actinic keratoses    BPH (benign prostatic hypertrophy)    ED (erectile dysfunction)    Esophageal reflux    Hearing loss    Other and unspecified hyperlipidemia    Premature atrial contractions    Pulmonary emboli (HCC)    Pulmonary emboli (HCC)    Skin cancer    Unspecified essential hypertension     Past Surgical History:  Procedure Laterality Date   EXCISION MASS NECK Right 05/08/2021   Procedure: EXCISION RIGHT SUPERFICIAL NECK MASS UNDER LOCAL ANESTHESIA;  Surgeon: Beverly Gust, MD;  Location: [facility];  Service: ENT;   Laterality: Right;   INGUINAL HERNIA REPAIR Right     Family History  Problem Relation Age of Onset   Hypertension Father    CAD Father    Hypertension Mother    CAD Mother    Hypertension Maternal Grandmother    CAD Maternal Grandmother    Heart disease Brother    Hypertension Brother     Social History   Socioeconomic History   Marital status: Married    Spouse name: Not on file   Number of children: Not on file   Years of education: Not on file   Highest education level: Not on file  Occupational History   Not on file  Tobacco Use   Smoking status: Never   Smokeless tobacco: Never  Substance and Sexual Activity   Alcohol use: Yes    Comment: occasional   Drug use: No   Sexual activity: Not on file  Other Topics Concern   Not on file  Social History Narrative   Not on file   Social Determinants of Health   Financial Resource Strain: Not on file  Food Insecurity: Not on file  Transportation Needs: Not on file  Physical Activity: Not on file  Stress: Not on file  Social Connections: Not on file  Intimate Partner Violence: Not on file    ROS Review of Systems  Constitutional:  Negative for chills and fever.  HENT:  Negative for trouble swallowing.   Eyes:  Negative for photophobia, pain and visual disturbance.  Respiratory:  Negative for shortness of breath.   Cardiovascular:  Negative for chest pain.  Gastrointestinal: Negative.   Endocrine: Negative.   Genitourinary: Negative.   Musculoskeletal: Negative.   Skin:  Positive for rash.  Allergic/Immunologic: Negative.   Neurological: Negative.   Hematological: Negative.   Psychiatric/Behavioral: Negative.     Objective:   Today's Vitals: BP 137/86    Pulse 68    Temp 98.7 F (37.1 C) (Oral)    Resp 16    Wt 187 lb (84.8 kg)    SpO2 94%    BMI 27.62 kg/m   Physical Exam Vitals and nursing note reviewed.  Constitutional:      General: He is not in acute distress.    Appearance: Normal  appearance. He is not ill-appearing.  HENT:     Head: Normocephalic and atraumatic.     Right Ear: External ear normal.     Left Ear: External ear normal.     Nose: Nose normal.     Mouth/Throat:     Mouth: Mucous membranes are moist.     Pharynx: Oropharynx is clear.  Eyes:     Extraocular Movements: Extraocular movements intact.     Conjunctiva/sclera: Conjunctivae normal.     Pupils: Pupils are equal, round, and reactive to light.  Cardiovascular:     Rate and Rhythm: Normal rate and regular rhythm.     Pulses: Normal pulses.  Pulmonary:     Effort: Pulmonary effort is normal.     Breath sounds: Normal breath sounds.  Musculoskeletal:        General: Normal range of motion.     Cervical back: Normal range of motion and neck supple.  Skin:    General: Skin is warm.     Findings: Rash present. Rash is not pustular.     Comments: See photo  Neurological:     General: No focal deficit present.     Mental Status: He is alert and oriented to person, place, and time.  Psychiatric:        Mood and Affect: Mood normal.        Thought Content: Thought content normal.        Judgment: Judgment normal.     Assessment & Plan:   Problem List Items Addressed This Visit   None Visit Diagnoses     Allergic dermatitis    -  Primary   Relevant Medications   triamcinolone ointment (KENALOG) 0.5 %   Pruritus       Relevant Medications   hydrOXYzine (ATARAX) 10 MG tablet       Outpatient Encounter Medications as of 07/22/2021  Medication Sig   albuterol (VENTOLIN HFA) 108 (90 Base) MCG/ACT inhaler Inhale 2 puffs into the lungs every 6 (six) hours as needed for wheezing or shortness of breath.   amLODipine (NORVASC) 5 MG tablet Take 5 mg by mouth daily.   aspirin 81 MG tablet Take 81 mg by mouth daily.   hydrOXYzine (ATARAX) 10 MG tablet Take 1 tablet (10 mg total) by mouth 3 (three) times daily as needed.   losartan (COZAAR) 50 MG tablet Take 50 mg by mouth daily.   metoprolol  succinate (TOPROL-XL) 25 MG 24 hr tablet Take 25 mg by mouth daily.   omeprazole (PRILOSEC) 20 MG capsule Take 20 mg by mouth daily.   sildenafil (VIAGRA) 100 MG tablet Take  100 mg by mouth daily as needed for erectile dysfunction.   simvastatin (ZOCOR) 80 MG tablet Take 40 mg by mouth daily.    TEMAZEPAM PO Take by mouth.   triamcinolone ointment (KENALOG) 0.5 % Apply 1 application topically 2 (two) times daily.   zaleplon (SONATA) 10 MG capsule Take 10 mg by mouth at bedtime as needed for sleep.   budesonide-formoterol (SYMBICORT) 160-4.5 MCG/ACT inhaler Inhale 1 puff into the lungs 2 (two) times daily for 1 day.   diclofenac sodium (VOLTAREN) 1 % GEL Apply 2-4 g topically 4 (four) times daily.   [DISCONTINUED] olmesartan-hydrochlorothiazide (BENICAR HCT) 40-25 MG per tablet Take 1 tablet by mouth as needed.  (Patient not taking: Reported on 05/01/2021)   No facility-administered encounter medications on file as of 07/22/2021.   1. Allergic dermatitis Trial Kenalog.  Patient education given on supportive care.  Red flags given for prompt reevaluation - triamcinolone ointment (KENALOG) 0.5 %; Apply 1 application topically 2 (two) times daily.  Dispense: 30 g; Refill: 0  2. Pruritus Trial hydroxyzine, patient strongly encouraged to use medication with caution due to potential drowsiness.  Patient understands and agrees - hydrOXYzine (ATARAX) 10 MG tablet; Take 1 tablet (10 mg total) by mouth 3 (three) times daily as needed.  Dispense: 30 tablet; Refill: 0    I have reviewed the patient's medical history (PMH, PSH, Social History, Family History, Medications, and allergies) , and have been updated if relevant. I spent 25 minutes reviewing chart and  face to face time with patient.   Follow-up: Return if symptoms worsen or fail to improve.   Loraine Grip Mayers, PA-C

## 2021-07-23 ENCOUNTER — Encounter: Payer: Self-pay | Admitting: Physician Assistant

## 2021-07-27 DIAGNOSIS — C44519 Basal cell carcinoma of skin of other part of trunk: Secondary | ICD-10-CM | POA: Diagnosis not present

## 2021-07-27 DIAGNOSIS — D225 Melanocytic nevi of trunk: Secondary | ICD-10-CM | POA: Diagnosis not present

## 2021-07-27 DIAGNOSIS — D2271 Melanocytic nevi of right lower limb, including hip: Secondary | ICD-10-CM | POA: Diagnosis not present

## 2021-07-27 DIAGNOSIS — D2262 Melanocytic nevi of left upper limb, including shoulder: Secondary | ICD-10-CM | POA: Diagnosis not present

## 2021-07-27 DIAGNOSIS — D485 Neoplasm of uncertain behavior of skin: Secondary | ICD-10-CM | POA: Diagnosis not present

## 2021-07-27 DIAGNOSIS — L570 Actinic keratosis: Secondary | ICD-10-CM | POA: Diagnosis not present

## 2021-07-27 DIAGNOSIS — D0439 Carcinoma in situ of skin of other parts of face: Secondary | ICD-10-CM | POA: Diagnosis not present

## 2021-07-27 DIAGNOSIS — L821 Other seborrheic keratosis: Secondary | ICD-10-CM | POA: Diagnosis not present

## 2021-07-27 DIAGNOSIS — C44229 Squamous cell carcinoma of skin of left ear and external auricular canal: Secondary | ICD-10-CM | POA: Diagnosis not present

## 2021-07-27 DIAGNOSIS — C44311 Basal cell carcinoma of skin of nose: Secondary | ICD-10-CM | POA: Diagnosis not present

## 2021-07-27 DIAGNOSIS — D2272 Melanocytic nevi of left lower limb, including hip: Secondary | ICD-10-CM | POA: Diagnosis not present

## 2021-07-27 DIAGNOSIS — L578 Other skin changes due to chronic exposure to nonionizing radiation: Secondary | ICD-10-CM | POA: Diagnosis not present

## 2021-07-27 DIAGNOSIS — X32XXXA Exposure to sunlight, initial encounter: Secondary | ICD-10-CM | POA: Diagnosis not present

## 2021-07-27 DIAGNOSIS — C44319 Basal cell carcinoma of skin of other parts of face: Secondary | ICD-10-CM | POA: Diagnosis not present

## 2021-07-27 DIAGNOSIS — D2261 Melanocytic nevi of right upper limb, including shoulder: Secondary | ICD-10-CM | POA: Diagnosis not present

## 2021-08-27 DIAGNOSIS — L570 Actinic keratosis: Secondary | ICD-10-CM | POA: Diagnosis not present

## 2021-08-27 DIAGNOSIS — R972 Elevated prostate specific antigen [PSA]: Secondary | ICD-10-CM | POA: Diagnosis not present

## 2021-08-27 DIAGNOSIS — E559 Vitamin D deficiency, unspecified: Secondary | ICD-10-CM | POA: Diagnosis not present

## 2021-08-27 DIAGNOSIS — K449 Diaphragmatic hernia without obstruction or gangrene: Secondary | ICD-10-CM | POA: Diagnosis not present

## 2021-08-27 DIAGNOSIS — N529 Male erectile dysfunction, unspecified: Secondary | ICD-10-CM | POA: Diagnosis not present

## 2021-08-27 DIAGNOSIS — N400 Enlarged prostate without lower urinary tract symptoms: Secondary | ICD-10-CM | POA: Diagnosis not present

## 2021-08-27 DIAGNOSIS — R0602 Shortness of breath: Secondary | ICD-10-CM | POA: Diagnosis not present

## 2021-08-27 DIAGNOSIS — E7800 Pure hypercholesterolemia, unspecified: Secondary | ICD-10-CM | POA: Diagnosis not present

## 2021-08-27 DIAGNOSIS — G4700 Insomnia, unspecified: Secondary | ICD-10-CM | POA: Diagnosis not present

## 2021-08-27 DIAGNOSIS — I10 Essential (primary) hypertension: Secondary | ICD-10-CM | POA: Diagnosis not present

## 2021-08-27 DIAGNOSIS — Z0000 Encounter for general adult medical examination without abnormal findings: Secondary | ICD-10-CM | POA: Diagnosis not present

## 2021-08-27 DIAGNOSIS — Z1211 Encounter for screening for malignant neoplasm of colon: Secondary | ICD-10-CM | POA: Diagnosis not present

## 2021-08-27 DIAGNOSIS — I700 Atherosclerosis of aorta: Secondary | ICD-10-CM | POA: Diagnosis not present

## 2021-08-27 DIAGNOSIS — Z1389 Encounter for screening for other disorder: Secondary | ICD-10-CM | POA: Diagnosis not present

## 2021-09-24 DIAGNOSIS — C44319 Basal cell carcinoma of skin of other parts of face: Secondary | ICD-10-CM | POA: Diagnosis not present

## 2021-09-24 DIAGNOSIS — C44229 Squamous cell carcinoma of skin of left ear and external auricular canal: Secondary | ICD-10-CM | POA: Diagnosis not present

## 2021-09-24 DIAGNOSIS — C44311 Basal cell carcinoma of skin of nose: Secondary | ICD-10-CM | POA: Diagnosis not present

## 2021-09-24 DIAGNOSIS — D485 Neoplasm of uncertain behavior of skin: Secondary | ICD-10-CM | POA: Diagnosis not present

## 2021-09-30 DIAGNOSIS — Z20822 Contact with and (suspected) exposure to covid-19: Secondary | ICD-10-CM | POA: Diagnosis not present

## 2021-10-06 DIAGNOSIS — L570 Actinic keratosis: Secondary | ICD-10-CM | POA: Diagnosis not present

## 2021-10-06 DIAGNOSIS — C44519 Basal cell carcinoma of skin of other part of trunk: Secondary | ICD-10-CM | POA: Diagnosis not present

## 2021-10-06 DIAGNOSIS — C44311 Basal cell carcinoma of skin of nose: Secondary | ICD-10-CM | POA: Diagnosis not present

## 2021-10-09 DIAGNOSIS — E7800 Pure hypercholesterolemia, unspecified: Secondary | ICD-10-CM | POA: Diagnosis not present

## 2021-10-09 DIAGNOSIS — F4321 Adjustment disorder with depressed mood: Secondary | ICD-10-CM | POA: Diagnosis not present

## 2021-10-09 DIAGNOSIS — N400 Enlarged prostate without lower urinary tract symptoms: Secondary | ICD-10-CM | POA: Diagnosis not present

## 2021-10-09 DIAGNOSIS — K219 Gastro-esophageal reflux disease without esophagitis: Secondary | ICD-10-CM | POA: Diagnosis not present

## 2021-10-09 DIAGNOSIS — I10 Essential (primary) hypertension: Secondary | ICD-10-CM | POA: Diagnosis not present

## 2021-10-15 DIAGNOSIS — E559 Vitamin D deficiency, unspecified: Secondary | ICD-10-CM | POA: Diagnosis not present

## 2021-10-15 DIAGNOSIS — E538 Deficiency of other specified B group vitamins: Secondary | ICD-10-CM | POA: Diagnosis not present

## 2021-10-27 DIAGNOSIS — I491 Atrial premature depolarization: Secondary | ICD-10-CM | POA: Diagnosis not present

## 2021-10-27 DIAGNOSIS — I5189 Other ill-defined heart diseases: Secondary | ICD-10-CM | POA: Diagnosis not present

## 2021-10-27 DIAGNOSIS — I10 Essential (primary) hypertension: Secondary | ICD-10-CM | POA: Diagnosis not present

## 2021-10-27 DIAGNOSIS — I428 Other cardiomyopathies: Secondary | ICD-10-CM | POA: Diagnosis not present

## 2021-11-19 DIAGNOSIS — Z481 Encounter for planned postprocedural wound closure: Secondary | ICD-10-CM | POA: Diagnosis not present

## 2021-11-20 DIAGNOSIS — K219 Gastro-esophageal reflux disease without esophagitis: Secondary | ICD-10-CM | POA: Diagnosis not present

## 2021-11-20 DIAGNOSIS — I10 Essential (primary) hypertension: Secondary | ICD-10-CM | POA: Diagnosis not present

## 2021-11-20 DIAGNOSIS — N400 Enlarged prostate without lower urinary tract symptoms: Secondary | ICD-10-CM | POA: Diagnosis not present

## 2021-11-20 DIAGNOSIS — E7800 Pure hypercholesterolemia, unspecified: Secondary | ICD-10-CM | POA: Diagnosis not present

## 2021-11-20 DIAGNOSIS — F4321 Adjustment disorder with depressed mood: Secondary | ICD-10-CM | POA: Diagnosis not present

## 2021-12-31 DIAGNOSIS — D485 Neoplasm of uncertain behavior of skin: Secondary | ICD-10-CM | POA: Diagnosis not present

## 2021-12-31 DIAGNOSIS — C44619 Basal cell carcinoma of skin of left upper limb, including shoulder: Secondary | ICD-10-CM | POA: Diagnosis not present

## 2021-12-31 DIAGNOSIS — C44719 Basal cell carcinoma of skin of left lower limb, including hip: Secondary | ICD-10-CM | POA: Diagnosis not present

## 2021-12-31 DIAGNOSIS — D2261 Melanocytic nevi of right upper limb, including shoulder: Secondary | ICD-10-CM | POA: Diagnosis not present

## 2021-12-31 DIAGNOSIS — D225 Melanocytic nevi of trunk: Secondary | ICD-10-CM | POA: Diagnosis not present

## 2021-12-31 DIAGNOSIS — L821 Other seborrheic keratosis: Secondary | ICD-10-CM | POA: Diagnosis not present

## 2021-12-31 DIAGNOSIS — C44519 Basal cell carcinoma of skin of other part of trunk: Secondary | ICD-10-CM | POA: Diagnosis not present

## 2021-12-31 DIAGNOSIS — D2262 Melanocytic nevi of left upper limb, including shoulder: Secondary | ICD-10-CM | POA: Diagnosis not present

## 2021-12-31 DIAGNOSIS — C441122 Basal cell carcinoma of skin of right lower eyelid, including canthus: Secondary | ICD-10-CM | POA: Diagnosis not present

## 2021-12-31 DIAGNOSIS — C44612 Basal cell carcinoma of skin of right upper limb, including shoulder: Secondary | ICD-10-CM | POA: Diagnosis not present

## 2021-12-31 DIAGNOSIS — L570 Actinic keratosis: Secondary | ICD-10-CM | POA: Diagnosis not present

## 2021-12-31 DIAGNOSIS — C44311 Basal cell carcinoma of skin of nose: Secondary | ICD-10-CM | POA: Diagnosis not present

## 2021-12-31 DIAGNOSIS — X32XXXA Exposure to sunlight, initial encounter: Secondary | ICD-10-CM | POA: Diagnosis not present

## 2022-02-09 DIAGNOSIS — K219 Gastro-esophageal reflux disease without esophagitis: Secondary | ICD-10-CM | POA: Diagnosis not present

## 2022-02-09 DIAGNOSIS — R0781 Pleurodynia: Secondary | ICD-10-CM | POA: Diagnosis not present

## 2022-03-10 IMAGING — CR DG CHEST 2V
2 series · 2 of 2 positions shown · non-contrast
Comparison: None.

CLINICAL DATA: Unspecified chest pain x2 months

EXAM:
CHEST - 2 VIEW

[w chest pa]
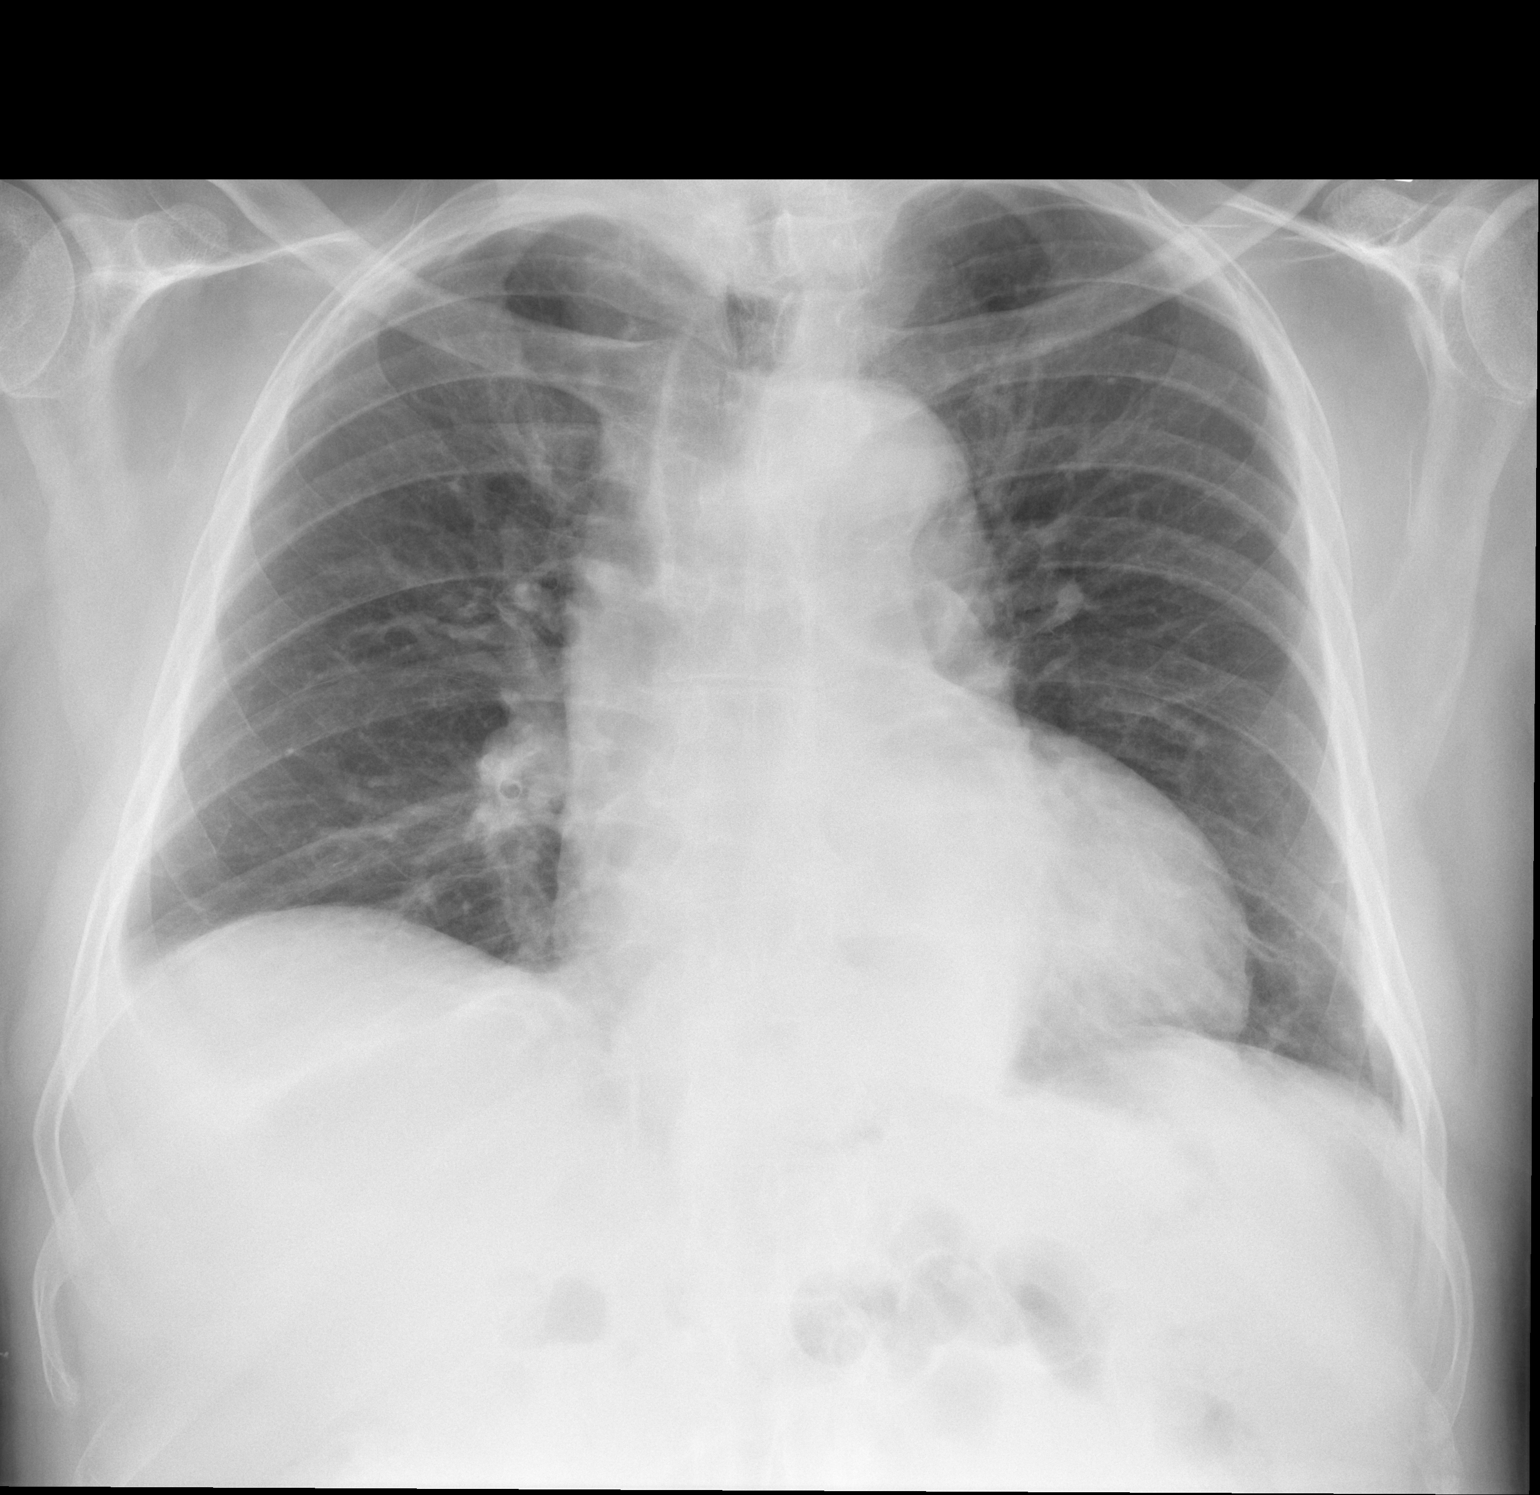

[w chest lat]
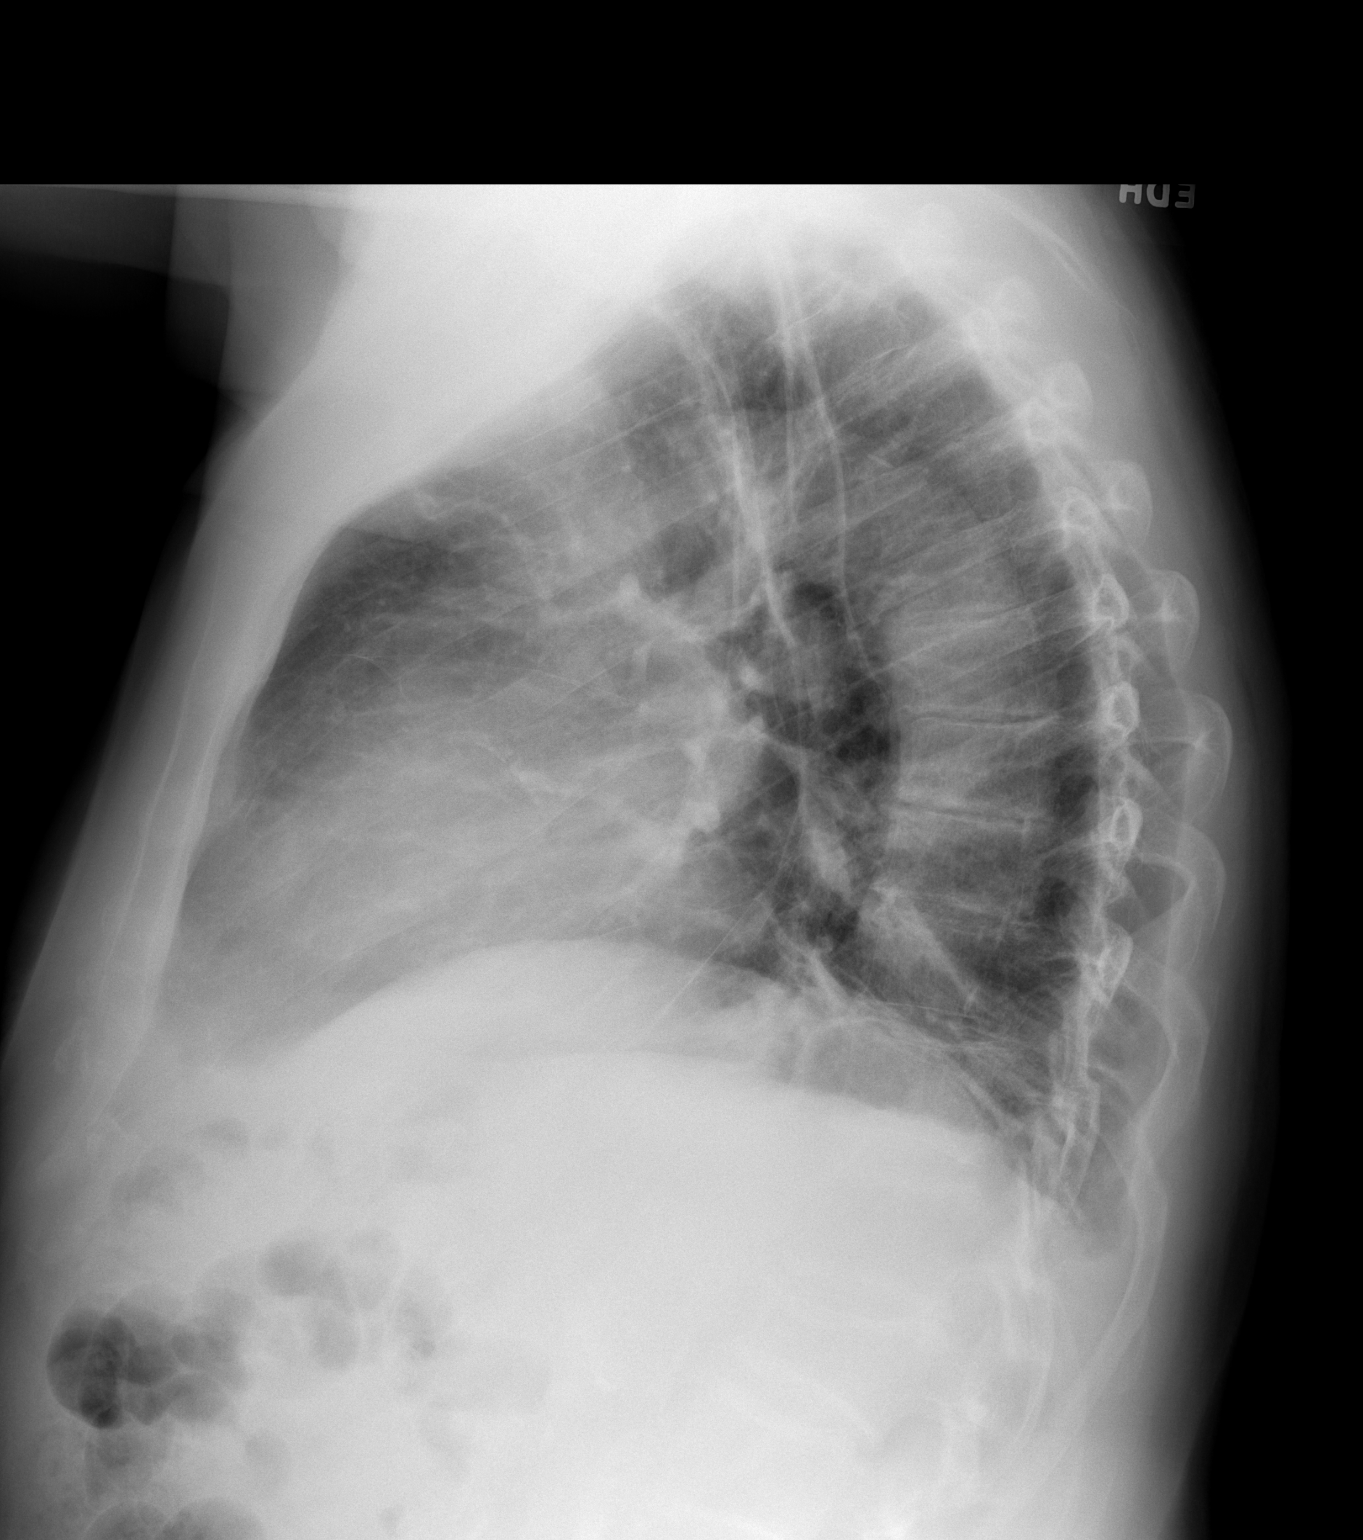

[2 of 2 positions shown; findings below may reference images not displayed]

FINDINGS: Enlarged cardiac silhouette, likely accentuated by technique. Low
lung volumes with bibasilar subsegmental atelectasis. No focal
consolidation or overt pulmonary edema. No acute osseous
abnormality.
IMPRESSION: Low lung volumes with bibasilar subsegmental atelectasis, no focal
consolidation or overt pulmonary edema.

## 2022-04-29 DIAGNOSIS — I10 Essential (primary) hypertension: Secondary | ICD-10-CM | POA: Diagnosis not present

## 2022-04-29 DIAGNOSIS — I700 Atherosclerosis of aorta: Secondary | ICD-10-CM | POA: Diagnosis not present

## 2022-04-29 DIAGNOSIS — I491 Atrial premature depolarization: Secondary | ICD-10-CM | POA: Diagnosis not present

## 2022-04-29 DIAGNOSIS — I5189 Other ill-defined heart diseases: Secondary | ICD-10-CM | POA: Diagnosis not present

## 2022-04-29 DIAGNOSIS — I420 Dilated cardiomyopathy: Secondary | ICD-10-CM | POA: Diagnosis not present

## 2022-05-10 DIAGNOSIS — I420 Dilated cardiomyopathy: Secondary | ICD-10-CM | POA: Diagnosis not present

## 2022-05-26 IMAGING — RF DG UGI W/ HIGH DENSITY W/O KUB
14 of 18 series · 14 of 18 positions shown · non-contrast
Comparison: CT 05/16/2020.

CLINICAL DATA: Gastroesophageal reflux.  Hiatal hernia.

EXAM:
UPPER GI SERIES WITHOUT KUB
TECHNIQUE: Routine upper GI series was performed with thick and thin barium.
FLUOROSCOPY TIME:  Fluoroscopy Time:  1 minutes 6 seconds
Radiation Exposure Index (if provided by the fluoroscopic device):
52.5 mGy

[Series 1: fluoro_barium singleshot_bw · 0.18mm/px · 1 of 1 slices shown (1 of 14)]
[im 1/1]
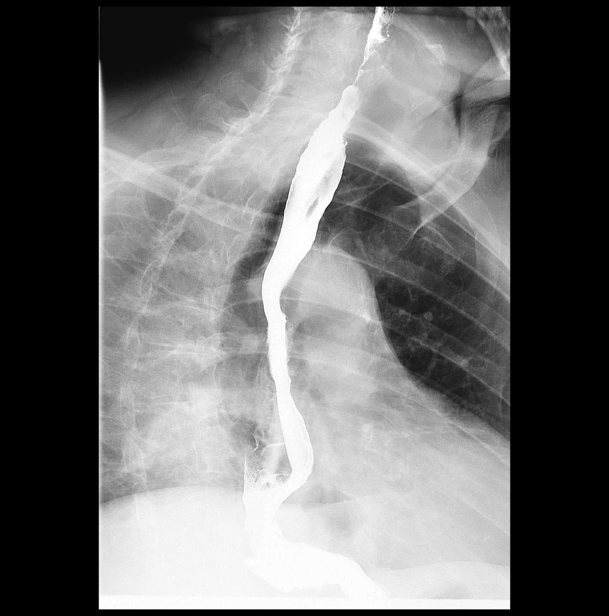

[Series 2: fluoro_barium singleshot_bw · 0.18mm/px · 1 of 1 slices shown (2 of 14)]
[im 1/1]
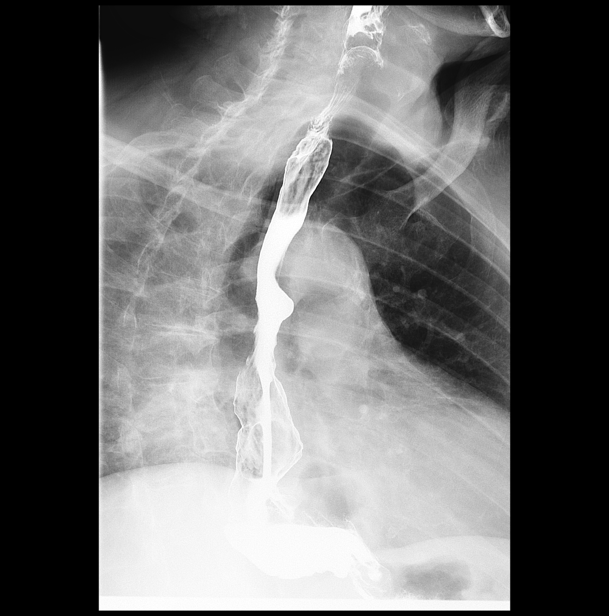

[Series 4: fluoro_barium singleshot_bw · 0.18mm/px · 1 of 1 slices shown (3 of 14)]
[im 1/1]
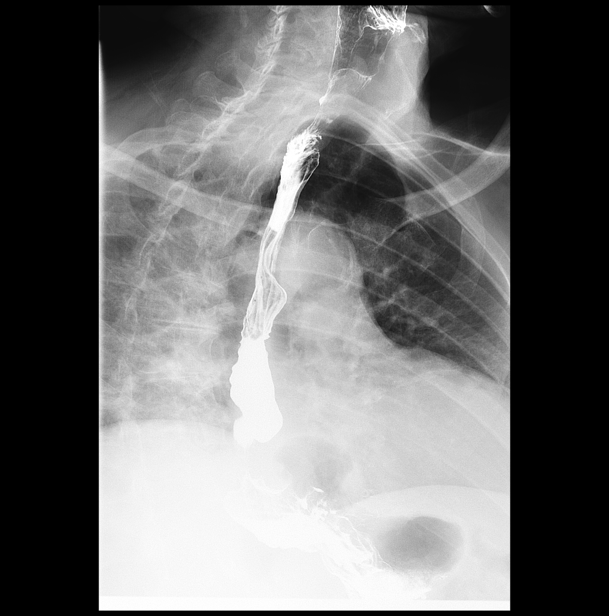

[Series 5: fluoro_barium singleshot_bw · 0.18mm/px · 1 of 1 slices shown (4 of 14)]
[im 1/1]
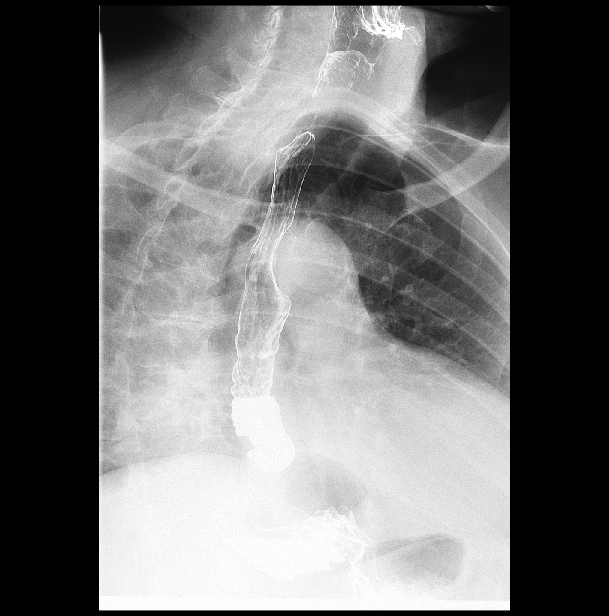

[Series 6: fluoro_barium singleshot_bw · 0.19mm/px · 1 of 1 slices shown (5 of 14)]
[im 1/1]
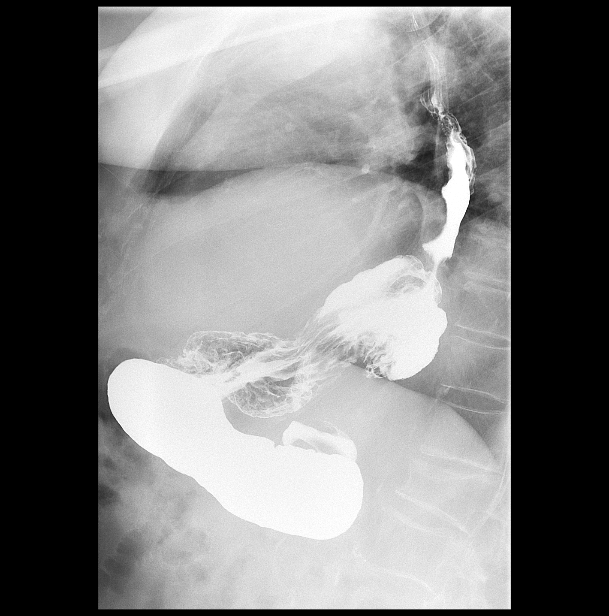

[Series 8: fluoro_barium singleshot_bw · 0.19mm/px · 1 of 1 slices shown (6 of 14)]
[im 1/1]
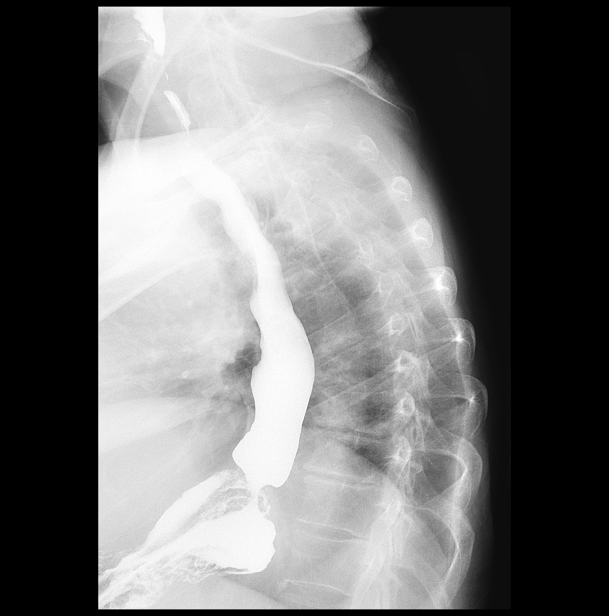

[Series 9: fluoro_barium singleshot_bw · 0.19mm/px · 1 of 1 slices shown (7 of 14)]
[im 1/1]
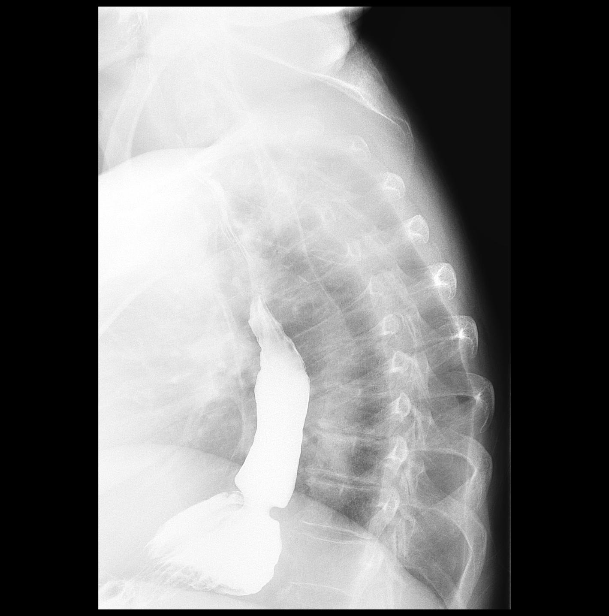

[Series 10: fluoro_barium singleshot_bw · 0.19mm/px · 1 of 1 slices shown (8 of 14)]
[im 1/1]
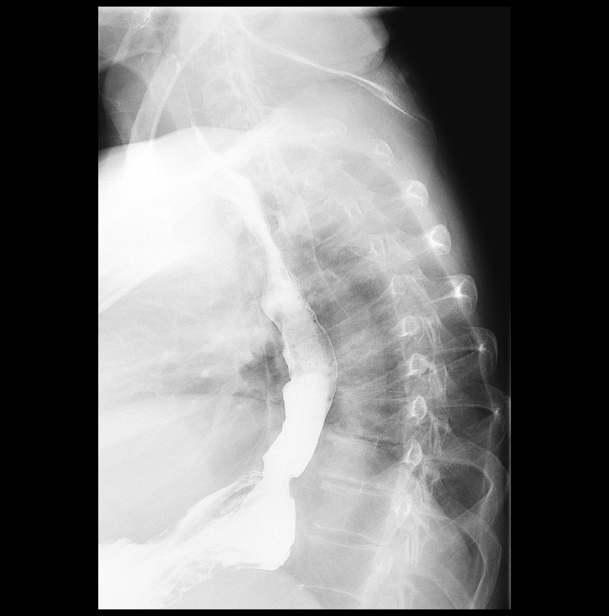

[Series 11: fluoro_barium singleshot_bw · 0.19mm/px · 1 of 1 slices shown (9 of 14)]
[im 1/1]
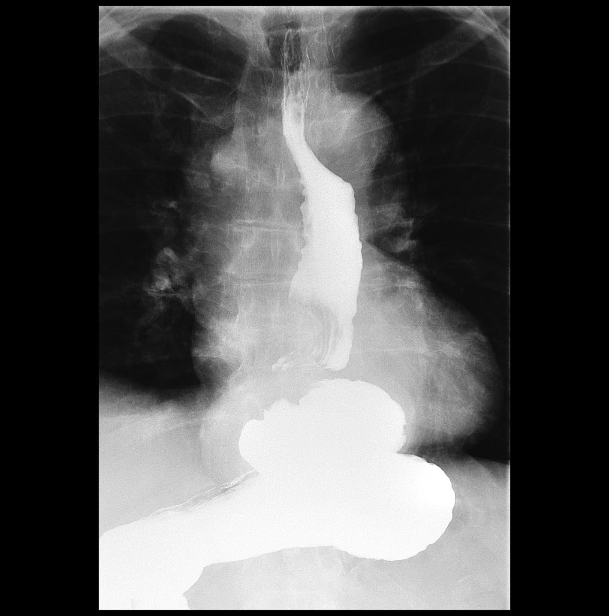

[Series 13: fluoro_barium singleshot_bw · 0.19mm/px · 1 of 1 slices shown (10 of 14)]
[im 1/1]
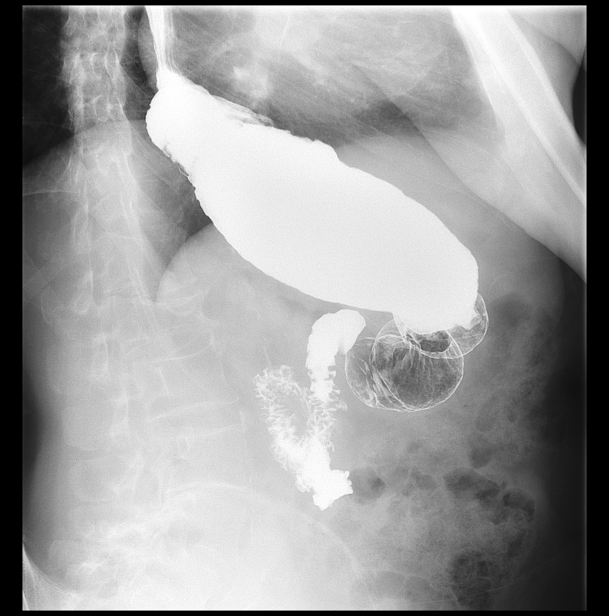

[Series 14: fluoro_barium singleshot_bw · 0.18mm/px · 1 of 1 slices shown (11 of 14)]
[im 1/1]
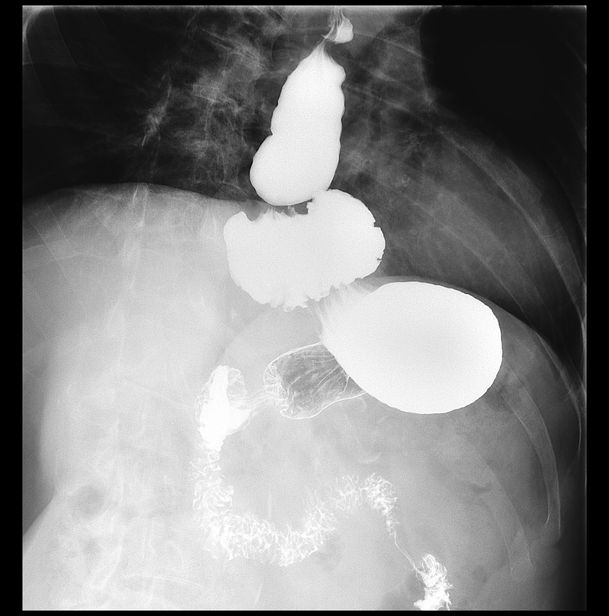

[Series 15: fluoro_barium singleshot_bw · 0.19mm/px · 1 of 1 slices shown (12 of 14)]
[im 1/1]
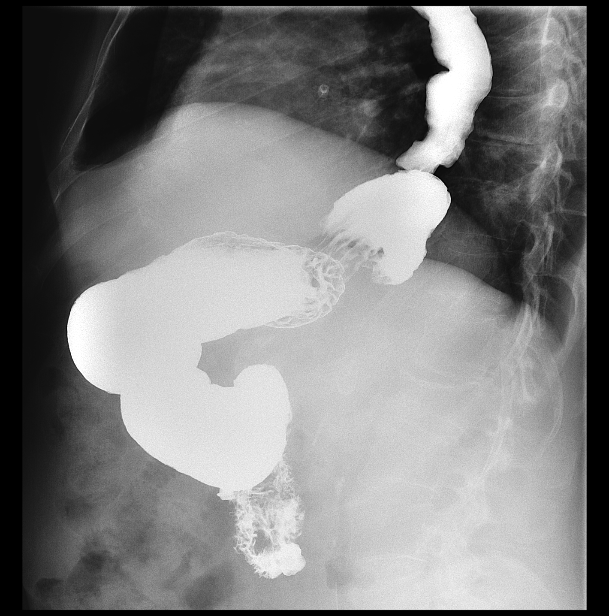

[Series 17: fluoro_barium singleshot_bw · 0.19mm/px · 1 of 1 slices shown (13 of 14)]
[im 1/1]
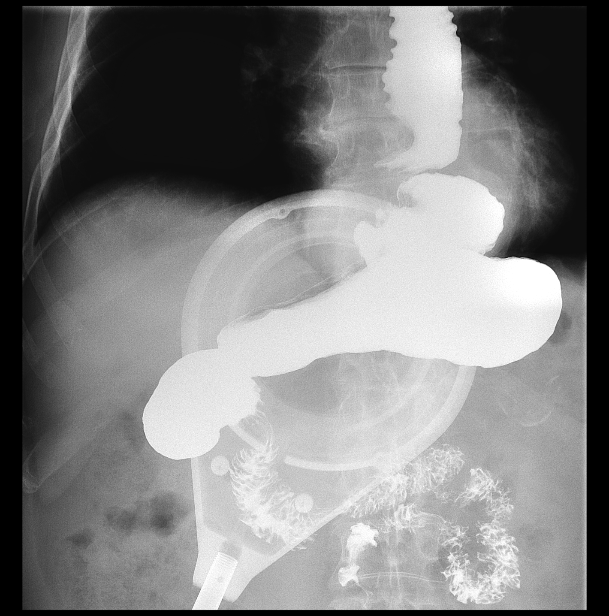

[Series 18: fluoro_barium singleshot_bw · 0.19mm/px · 1 of 1 slices shown (14 of 14)]
[im 1/1]
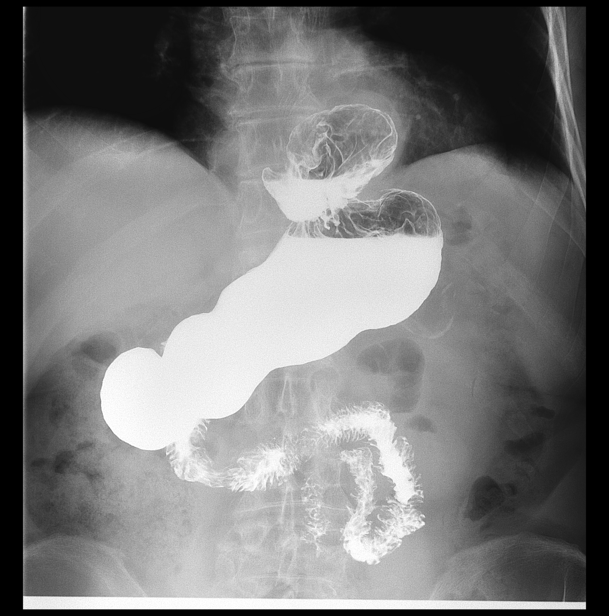

[14 of 18 positions shown; findings below may reference images not displayed]

FINDINGS: Esophagus is widely patent. Tertiary esophageal contractions
consistent with presbyesophagus noted. Prominent sliding hiatal
hernia with prominent gastroesophageal reflux noted. Associated
esophagitis cannot be excluded. Remainder of the stomach appears
unremarkable. Duodenal bulb and C-loop in are unremarkable. No
evidence of ulceration.
IMPRESSION: 1. Prominent sliding hiatal hernia with prominent gastroesophageal
reflux. Associated esophagitis cannot be excluded.

2.  Presbyesophagus.

## 2022-07-15 DIAGNOSIS — I10 Essential (primary) hypertension: Secondary | ICD-10-CM | POA: Diagnosis not present

## 2022-07-15 DIAGNOSIS — L209 Atopic dermatitis, unspecified: Secondary | ICD-10-CM | POA: Diagnosis not present

## 2022-08-02 ENCOUNTER — Ambulatory Visit
Admission: EM | Admit: 2022-08-02 | Discharge: 2022-08-02 | Disposition: A | Discharge status: Home / Self Care | Payer: Medicare Other | Attending: Internal Medicine | Admitting: Internal Medicine

## 2022-08-02 DIAGNOSIS — R051 Acute cough: Secondary | ICD-10-CM

## 2022-08-02 MED ORDER — HYDROCODONE BIT-HOMATROP MBR 5-1.5 MG/5ML PO SOLN: 5.0000 mL | Freq: Four times a day (QID) | ORAL | 0 refills | Status: DC | PRN

## 2022-08-02 MED ORDER — DOXYCYCLINE HYCLATE 100 MG PO CAPS: 100.0000 mg | ORAL_CAPSULE | Freq: Two times a day (BID) | ORAL | 0 refills | Status: DC

## 2022-08-02 NOTE — ED Provider Notes (Signed)
[facility]    CSN: [id number] Arrival date & time: 08/02/22  0808      History   Chief Complaint Chief Complaint  Patient presents with   Cough    HPI Raymond Stanley is a 77 y.o. male.   Patient presents with a dry cough and fever.  He reports that cough has been present for about 10 days.  He does report that he has a chronic cough but reports that is not typically this harsh.  It became more harsh over the past 10 days.  He does report that he has had fever when symptoms first started with Tmax being 101.  He denies any associated upper respiratory symptoms or known sick contacts.  Denies chest pain or shortness of breath.  Reports that he is taking "all of the over-the-counter cough medications" including Mucinex, DayQuil, NyQuil, and some leftover benzonatate with no improvement in coughing.  He does see pulmonology for laryngopharyngeal reflux.  He states that he was prescribed albuterol nebulizer treatments for them to take as needed for cough.  He has been using them consistently since this cough became more harsh with minimal improvement.   Cough   Past Medical History:  Diagnosis Date   Actinic keratoses    BPH (benign prostatic hypertrophy)    ED (erectile dysfunction)    Esophageal reflux    Hearing loss    Other and unspecified hyperlipidemia    Premature atrial contractions    Pulmonary emboli (HCC)    Pulmonary emboli (HCC)    Skin cancer    Unspecified essential hypertension     Patient Active Problem List   Diagnosis Date Noted   Unilateral primary osteoarthritis, left knee 09/22/2016    Past Surgical History:  Procedure Laterality Date   EXCISION MASS NECK Right 05/08/2021   Procedure: EXCISION RIGHT SUPERFICIAL NECK MASS UNDER LOCAL ANESTHESIA;  Surgeon: Beverly Gust, MD;  Location: [facility];  Service: ENT;  Laterality: Right;   INGUINAL HERNIA REPAIR Right        Home Medications    Prior to Admission medications    Medication Sig Start Date End Date Taking? Authorizing Provider  doxycycline (VIBRAMYCIN) 100 MG capsule Take 1 capsule (100 mg total) by mouth 2 (two) times daily. 08/02/22  Yes Jyssica Rief, Hildred Alamin E, FNP  HYDROcodone bit-homatropine (HYCODAN) 5-1.5 MG/5ML syrup Take 5 mLs by mouth every 6 (six) hours as needed for cough. 08/02/22  Yes Miyeko Mahlum, Michele Rockers, FNP  albuterol (VENTOLIN HFA) 108 (90 Base) MCG/ACT inhaler Inhale 2 puffs into the lungs every 6 (six) hours as needed for wheezing or shortness of breath. 09/29/20   Merlyn Lot, MD  amLODipine (NORVASC) 5 MG tablet Take 5 mg by mouth daily.    [provider]  aspirin 81 MG tablet Take 81 mg by mouth daily.    [provider]  budesonide-formoterol (SYMBICORT) 160-4.5 MCG/ACT inhaler Inhale 1 puff into the lungs 2 (two) times daily for 1 day. 04/05/18 04/06/18  Brand Males, MD  diclofenac sodium (VOLTAREN) 1 % GEL Apply 2-4 g topically 4 (four) times daily. 10/12/16   Garald Balding, MD  hydrOXYzine (ATARAX) 10 MG tablet Take 1 tablet (10 mg total) by mouth 3 (three) times daily as needed. 07/22/21   Mayers, Cari S, PA-C  losartan (COZAAR) 50 MG tablet Take 50 mg by mouth daily.    [provider]  metoprolol succinate (TOPROL-XL) 25 MG 24 hr tablet Take 25 mg by mouth daily.  [provider]  omeprazole (PRILOSEC) 20 MG capsule Take 20 mg by mouth daily.    [provider]  sildenafil (VIAGRA) 100 MG tablet Take 100 mg by mouth daily as needed for erectile dysfunction.    [provider]  simvastatin (ZOCOR) 80 MG tablet Take 40 mg by mouth daily.     [provider]  TEMAZEPAM PO Take by mouth.    [provider]  triamcinolone ointment (KENALOG) 0.5 % Apply 1 application topically 2 (two) times daily. 07/22/21   Mayers, Cari S, PA-C  zaleplon (SONATA) 10 MG capsule Take 10 mg by mouth at bedtime as needed for sleep.    [provider]    Family History Family  History  Problem Relation Age of Onset   Hypertension Father    CAD Father    Hypertension Mother    CAD Mother    Hypertension Maternal Grandmother    CAD Maternal Grandmother    Heart disease Brother    Hypertension Brother     Social History Social History   Tobacco Use   Smoking status: Never   Smokeless tobacco: Never  Substance Use Topics   Alcohol use: Yes    Comment: occasional   Drug use: No     Allergies   Ace inhibitors, Crestor [rosuvastatin], Norvasc [amlodipine besylate], and Carac [fluorouracil]   Review of Systems Review of Systems Per HPI  Physical Exam Triage Vital Signs ED Triage Vitals  Enc Vitals Group     BP 08/02/22 0815 (!) 148/88     Pulse Rate 08/02/22 0815 71     Resp 08/02/22 0815 14     Temp 08/02/22 0815 97.9 F (36.6 C)     Temp Source 08/02/22 0815 Oral     SpO2 08/02/22 0815 95 %     Weight --      Height --      Head Circumference --      Peak Flow --      Pain Score 08/02/22 0817 0     Pain Loc --      Pain Edu? --      Excl. in [place]? --    No data found.  Updated Vital Signs BP (!) 148/88 (BP Location: Left Arm)   Pulse 71   Temp 97.9 F (36.6 C) (Oral)   Resp 14   SpO2 95%   Visual Acuity Right Eye Distance:   Left Eye Distance:   Bilateral Distance:    Right Eye Near:   Left Eye Near:    Bilateral Near:     Physical Exam Constitutional:      General: He is not in acute distress.    Appearance: Normal appearance. He is not toxic-appearing or diaphoretic.  HENT:     Head: Normocephalic and atraumatic.     Right Ear: Tympanic membrane and ear canal normal.     Left Ear: Tympanic membrane and ear canal normal.     Nose: No congestion.     Mouth/Throat:     Mouth: Mucous membranes are moist.     Pharynx: No posterior oropharyngeal erythema.  Eyes:     Extraocular Movements: Extraocular movements intact.     Conjunctiva/sclera: Conjunctivae normal.     Pupils: Pupils are equal, round, and reactive to  light.  Cardiovascular:     Rate and Rhythm: Normal rate and regular rhythm.     Pulses: Normal pulses.     Heart sounds: Normal heart sounds.  Pulmonary:     Effort: Pulmonary effort is normal. No respiratory distress.     Breath sounds: Normal breath sounds. No stridor. No wheezing, rhonchi or rales.  Abdominal:     General: Abdomen is flat. Bowel sounds are normal.     Palpations: Abdomen is soft.  Musculoskeletal:        General: Normal range of motion.     Cervical back: Normal range of motion.  Skin:    General: Skin is warm and dry.  Neurological:     General: No focal deficit present.     Mental Status: He is alert and oriented to person, place, and time. Mental status is at baseline.  Psychiatric:        Mood and Affect: Mood normal.        Behavior: Behavior normal.      UC Treatments / Results  Labs (all labs ordered are listed, but only abnormal results are displayed) Labs Reviewed - No data to display  EKG   Radiology No results found.  Procedures Procedures (including critical care time)  Medications Ordered in UC Medications - No data to display  Initial Impression / Assessment and Plan / UC Course  I have reviewed the triage vital signs and the nursing notes.  Pertinent labs & imaging results that were available during my care of the patient were reviewed by me and considered in my medical decision making (see chart for details).     Patient's lung sounds are clear but did suggest chest x-ray given coughing has become more harsh and he had associated fever.  Do not have x-ray tech here in urgent care today so recommended outpatient x-ray at imaging center to patient.  He declined x-ray.  Risks associated with not doing x-ray discussed with patient.  Patient voiced understanding and accepted risks.  Suspect possible bronchitis given cough has become more harsh and patient had associated fever.  Most likely viral in nature but given patient is deferring  x-ray, will treat with doxycycline.  Patient requesting cough medication.  Limited options given patient's symptoms have been refractory to Mucinex and Tessalon Perles.  Will defer Promethazine DM so as not to increase blood pressure.  Patient requesting Tussionex as he has taken this before and tolerated well.  Although, I do think that better option would be Hycodan as it is safer with patient's age group.  Patient advised to use this sparingly as it can cause drowsiness.  He was also advised to not drive or drink alcohol while taking it.  Patient does take temazepam as PDMP was reviewed.  He states that he only takes this very rarely.  Encouraged patient to not take these medications in the same day.  Patient voiced understanding.  Encouraged close follow-up if any symptoms persist or worsen.  Patient voiced understanding and was agreeable with plan. Final Clinical Impressions(s) / UC Diagnoses   Final diagnoses:  Acute cough     Discharge Instructions      It appears that you may have bronchitis.  I have prescribed cough medication and an antibiotic.  Take the antibiotic with food to avoid nausea.  The cough medication can make you drowsy.  Use this sparingly and do not drive or drink alcohol with taking it.  Please avoid temazepam in the same day while taking Hycodan cough syrup.  Follow-up if any symptoms persist or worsen.    ED Prescriptions     Medication Sig Dispense Auth. Provider   HYDROcodone bit-homatropine (  HYCODAN) 5-1.5 MG/5ML syrup Take 5 mLs by mouth every 6 (six) hours as needed for cough. 120 mL Oswaldo Conroy E, [place]   doxycycline (VIBRAMYCIN) 100 MG capsule Take 1 capsule (100 mg total) by mouth 2 (two) times daily. 20 capsule [place], [place], [place]      I have reviewed the PDMP during this encounter.   Teodora Medici, [place] 08/02/22 [phone]

## 2022-08-02 NOTE — ED Triage Notes (Signed)
Pt presents with c/o cough x 10 days

## 2022-08-02 NOTE — Discharge Instructions (Signed)
It appears that you may have bronchitis.  I have prescribed cough medication and an antibiotic.  Take the antibiotic with food to avoid nausea.  The cough medication can make you drowsy.  Use this sparingly and do not drive or drink alcohol with taking it.  Please avoid temazepam in the same day while taking Hycodan cough syrup.  Follow-up if any symptoms persist or worsen.

## 2022-08-12 DIAGNOSIS — I10 Essential (primary) hypertension: Secondary | ICD-10-CM | POA: Diagnosis not present

## 2022-10-05 DIAGNOSIS — Z0000 Encounter for general adult medical examination without abnormal findings: Secondary | ICD-10-CM | POA: Diagnosis not present

## 2022-10-05 DIAGNOSIS — Z79899 Other long term (current) drug therapy: Secondary | ICD-10-CM | POA: Diagnosis not present

## 2022-10-05 DIAGNOSIS — E7800 Pure hypercholesterolemia, unspecified: Secondary | ICD-10-CM | POA: Diagnosis not present

## 2022-10-05 DIAGNOSIS — E559 Vitamin D deficiency, unspecified: Secondary | ICD-10-CM | POA: Diagnosis not present

## 2022-10-05 DIAGNOSIS — K9089 Other intestinal malabsorption: Secondary | ICD-10-CM | POA: Diagnosis not present

## 2022-10-05 DIAGNOSIS — K449 Diaphragmatic hernia without obstruction or gangrene: Secondary | ICD-10-CM | POA: Diagnosis not present

## 2022-10-05 DIAGNOSIS — R972 Elevated prostate specific antigen [PSA]: Secondary | ICD-10-CM | POA: Diagnosis not present

## 2022-10-05 DIAGNOSIS — I700 Atherosclerosis of aorta: Secondary | ICD-10-CM | POA: Diagnosis not present

## 2022-10-05 DIAGNOSIS — Z23 Encounter for immunization: Secondary | ICD-10-CM | POA: Diagnosis not present

## 2022-10-05 DIAGNOSIS — L570 Actinic keratosis: Secondary | ICD-10-CM | POA: Diagnosis not present

## 2022-10-05 DIAGNOSIS — I10 Essential (primary) hypertension: Secondary | ICD-10-CM | POA: Diagnosis not present

## 2022-10-05 DIAGNOSIS — N400 Enlarged prostate without lower urinary tract symptoms: Secondary | ICD-10-CM | POA: Diagnosis not present

## 2022-10-27 DIAGNOSIS — I491 Atrial premature depolarization: Secondary | ICD-10-CM | POA: Diagnosis not present

## 2022-10-27 DIAGNOSIS — I10 Essential (primary) hypertension: Secondary | ICD-10-CM | POA: Diagnosis not present

## 2022-10-27 DIAGNOSIS — I420 Dilated cardiomyopathy: Secondary | ICD-10-CM | POA: Diagnosis not present

## 2022-10-27 DIAGNOSIS — I700 Atherosclerosis of aorta: Secondary | ICD-10-CM | POA: Diagnosis not present

## 2022-10-27 DIAGNOSIS — I5189 Other ill-defined heart diseases: Secondary | ICD-10-CM | POA: Diagnosis not present

## 2022-11-02 DIAGNOSIS — T1512XA Foreign body in conjunctival sac, left eye, initial encounter: Secondary | ICD-10-CM | POA: Diagnosis not present

## 2023-01-17 DIAGNOSIS — L821 Other seborrheic keratosis: Secondary | ICD-10-CM | POA: Diagnosis not present

## 2023-01-17 DIAGNOSIS — R202 Paresthesia of skin: Secondary | ICD-10-CM | POA: Diagnosis not present

## 2023-01-17 DIAGNOSIS — L814 Other melanin hyperpigmentation: Secondary | ICD-10-CM | POA: Diagnosis not present

## 2023-01-17 DIAGNOSIS — D692 Other nonthrombocytopenic purpura: Secondary | ICD-10-CM | POA: Diagnosis not present

## 2023-01-17 DIAGNOSIS — D485 Neoplasm of uncertain behavior of skin: Secondary | ICD-10-CM | POA: Diagnosis not present

## 2023-01-17 DIAGNOSIS — L578 Other skin changes due to chronic exposure to nonionizing radiation: Secondary | ICD-10-CM | POA: Diagnosis not present

## 2023-01-17 DIAGNOSIS — Z85828 Personal history of other malignant neoplasm of skin: Secondary | ICD-10-CM | POA: Diagnosis not present

## 2023-01-17 DIAGNOSIS — C4491 Basal cell carcinoma of skin, unspecified: Secondary | ICD-10-CM | POA: Diagnosis not present

## 2023-01-17 DIAGNOSIS — Z08 Encounter for follow-up examination after completed treatment for malignant neoplasm: Secondary | ICD-10-CM | POA: Diagnosis not present

## 2023-01-17 DIAGNOSIS — C44629 Squamous cell carcinoma of skin of left upper limb, including shoulder: Secondary | ICD-10-CM | POA: Diagnosis not present

## 2023-02-23 ENCOUNTER — Ambulatory Visit (HOSPITAL_COMMUNITY): Admit: 2023-02-23 | Payer: Medicare Other | Admitting: Cardiovascular Disease

## 2023-02-23 ENCOUNTER — Inpatient Hospital Stay (HOSPITAL_COMMUNITY)
Admission: EM | Disposition: A | Discharge status: Home / Self Care | Payer: Self-pay | Source: Home / Self Care | Attending: Cardiovascular Disease

## 2023-02-23 ENCOUNTER — Inpatient Hospital Stay (HOSPITAL_COMMUNITY)
Admission: EM | Admit: 2023-02-23 | Discharge: 2023-02-25 | DRG: 322 | Disposition: A | Discharge status: Home / Self Care | Payer: Medicare Other | Attending: Cardiovascular Disease | Admitting: Cardiovascular Disease

## 2023-02-23 DIAGNOSIS — N400 Enlarged prostate without lower urinary tract symptoms: Secondary | ICD-10-CM | POA: Diagnosis present

## 2023-02-23 DIAGNOSIS — I2111 ST elevation (STEMI) myocardial infarction involving right coronary artery: Secondary | ICD-10-CM | POA: Diagnosis not present

## 2023-02-23 DIAGNOSIS — I2510 Atherosclerotic heart disease of native coronary artery without angina pectoris: Secondary | ICD-10-CM | POA: Diagnosis not present

## 2023-02-23 DIAGNOSIS — I472 Ventricular tachycardia, unspecified: Secondary | ICD-10-CM | POA: Diagnosis present

## 2023-02-23 DIAGNOSIS — I428 Other cardiomyopathies: Secondary | ICD-10-CM | POA: Diagnosis present

## 2023-02-23 DIAGNOSIS — I249 Acute ischemic heart disease, unspecified: Principal | ICD-10-CM

## 2023-02-23 DIAGNOSIS — I5022 Chronic systolic (congestive) heart failure: Secondary | ICD-10-CM | POA: Diagnosis present

## 2023-02-23 DIAGNOSIS — Z79899 Other long term (current) drug therapy: Secondary | ICD-10-CM

## 2023-02-23 DIAGNOSIS — R079 Chest pain, unspecified: Secondary | ICD-10-CM | POA: Diagnosis not present

## 2023-02-23 DIAGNOSIS — Z955 Presence of coronary angioplasty implant and graft: Secondary | ICD-10-CM

## 2023-02-23 DIAGNOSIS — Z7982 Long term (current) use of aspirin: Secondary | ICD-10-CM

## 2023-02-23 DIAGNOSIS — I10 Essential (primary) hypertension: Secondary | ICD-10-CM | POA: Insufficient documentation

## 2023-02-23 DIAGNOSIS — Z888 Allergy status to other drugs, medicaments and biological substances status: Secondary | ICD-10-CM | POA: Diagnosis not present

## 2023-02-23 DIAGNOSIS — R61 Generalized hyperhidrosis: Secondary | ICD-10-CM | POA: Diagnosis not present

## 2023-02-23 DIAGNOSIS — I252 Old myocardial infarction: Secondary | ICD-10-CM

## 2023-02-23 DIAGNOSIS — Z8249 Family history of ischemic heart disease and other diseases of the circulatory system: Secondary | ICD-10-CM

## 2023-02-23 DIAGNOSIS — E785 Hyperlipidemia, unspecified: Secondary | ICD-10-CM | POA: Insufficient documentation

## 2023-02-23 DIAGNOSIS — I213 ST elevation (STEMI) myocardial infarction of unspecified site: Secondary | ICD-10-CM | POA: Diagnosis not present

## 2023-02-23 DIAGNOSIS — I491 Atrial premature depolarization: Secondary | ICD-10-CM | POA: Diagnosis not present

## 2023-02-23 DIAGNOSIS — I493 Ventricular premature depolarization: Secondary | ICD-10-CM | POA: Diagnosis not present

## 2023-02-23 DIAGNOSIS — L570 Actinic keratosis: Secondary | ICD-10-CM | POA: Diagnosis not present

## 2023-02-23 DIAGNOSIS — Z7951 Long term (current) use of inhaled steroids: Secondary | ICD-10-CM | POA: Diagnosis not present

## 2023-02-23 DIAGNOSIS — R1111 Vomiting without nausea: Secondary | ICD-10-CM | POA: Diagnosis not present

## 2023-02-23 DIAGNOSIS — K219 Gastro-esophageal reflux disease without esophagitis: Secondary | ICD-10-CM | POA: Diagnosis not present

## 2023-02-23 DIAGNOSIS — I110 Hypertensive heart disease with heart failure: Secondary | ICD-10-CM | POA: Diagnosis present

## 2023-02-23 DIAGNOSIS — Z9861 Coronary angioplasty status: Secondary | ICD-10-CM

## 2023-02-23 HISTORY — PX: CORONARY/GRAFT ACUTE MI REVASCULARIZATION: CATH118305

## 2023-02-23 LAB — LIPID PANEL
Cholesterol: 161 mg/dL (ref 0–200)
HDL: 36 mg/dL — ABNORMAL LOW (ref >40)
LDL Cholesterol: 104 mg/dL — ABNORMAL HIGH (ref 0–99)
Total CHOL/HDL Ratio: 4.5 RATIO
Triglycerides: 104 mg/dL (ref <150)
VLDL: 21 mg/dL (ref 0–40)

## 2023-02-23 LAB — COMPREHENSIVE METABOLIC PANEL
ALT: 15 U/L (ref 0–44)
AST: 20 U/L (ref 15–41)
Albumin: 3.5 g/dL (ref 3.5–5.0)
Alkaline Phosphatase: 74 U/L (ref 38–126)
Anion gap: 12 (ref 5–15)
BUN: 15 mg/dL (ref 8–23)
CO2: 21 mmol/L — ABNORMAL LOW (ref 22–32)
Calcium: 9.2 mg/dL (ref 8.9–10.3)
Chloride: 105 mmol/L (ref 98–111)
Creatinine, Ser: 1.2 mg/dL (ref 0.61–1.24)
GFR, Estimated: >60 mL/min (ref >60)
Glucose, Bld: 140 mg/dL — ABNORMAL HIGH (ref 70–99)
Potassium: 3.2 mmol/L — ABNORMAL LOW (ref 3.5–5.1)
Sodium: 138 mmol/L (ref 135–145)
Total Bilirubin: 1 mg/dL (ref 0.3–1.2)
Total Protein: 5.9 g/dL — ABNORMAL LOW (ref 6.5–8.1)

## 2023-02-23 LAB — CBC
HCT: 40.5 % (ref 39.0–52.0)
Hemoglobin: 13.8 g/dL (ref 13.0–17.0)
MCH: 30.9 pg (ref 26.0–34.0)
MCHC: 34.1 g/dL (ref 30.0–36.0)
MCV: 90.6 fL (ref 80.0–100.0)
Platelets: 67 10*3/uL — ABNORMAL LOW (ref 150–400)
RBC: 4.47 MIL/uL (ref 4.22–5.81)
RDW: 13.4 % (ref 11.5–15.5)
WBC: 7 10*3/uL (ref 4.0–10.5)
nRBC: 0 % (ref 0.0–0.2)

## 2023-02-23 LAB — CBC WITH DIFFERENTIAL/PLATELET
Abs Immature Granulocytes: 0.16 10*3/uL — ABNORMAL HIGH (ref 0.00–0.07)
Basophils Absolute: 0 10*3/uL (ref 0.0–0.1)
Basophils Relative: 1 %
Eosinophils Absolute: 0.1 10*3/uL (ref 0.0–0.5)
Eosinophils Relative: 2 %
HCT: 41.3 % (ref 39.0–52.0)
Hemoglobin: 13.3 g/dL (ref 13.0–17.0)
Immature Granulocytes: 3 %
Lymphocytes Relative: 41 %
Lymphs Abs: 2.4 10*3/uL (ref 0.7–4.0)
MCH: 29.6 pg (ref 26.0–34.0)
MCHC: 32.2 g/dL (ref 30.0–36.0)
MCV: 92 fL (ref 80.0–100.0)
Monocytes Absolute: 0.5 10*3/uL (ref 0.1–1.0)
Monocytes Relative: 8 %
Neutro Abs: 2.7 10*3/uL (ref 1.7–7.7)
Neutrophils Relative %: 45 %
Platelets: 159 10*3/uL (ref 150–400)
RBC: 4.49 MIL/uL (ref 4.22–5.81)
RDW: 13.2 % (ref 11.5–15.5)
WBC: 5.9 10*3/uL (ref 4.0–10.5)
nRBC: 0 % (ref 0.0–0.2)

## 2023-02-23 LAB — POCT I-STAT, CHEM 8
BUN: 16 mg/dL (ref 8–23)
Calcium, Ion: 1.25 mmol/L (ref 1.15–1.40)
Chloride: 105 mmol/L (ref 98–111)
Creatinine, Ser: 1 mg/dL (ref 0.61–1.24)
Glucose, Bld: 150 mg/dL — ABNORMAL HIGH (ref 70–99)
HCT: 38 % — ABNORMAL LOW (ref 39.0–52.0)
Hemoglobin: 12.9 g/dL — ABNORMAL LOW (ref 13.0–17.0)
Potassium: 3.7 mmol/L (ref 3.5–5.1)
Sodium: 139 mmol/L (ref 135–145)
TCO2: 20 mmol/L — ABNORMAL LOW (ref 22–32)

## 2023-02-23 LAB — PROTIME-INR
INR: 1.1 (ref 0.8–1.2)
Prothrombin Time: 14.1 seconds (ref 11.4–15.2)

## 2023-02-23 LAB — APTT: aPTT: 27 seconds (ref 24–36)

## 2023-02-23 LAB — POCT ACTIVATED CLOTTING TIME
Activated Clotting Time: 250 seconds
Activated Clotting Time: 256 seconds
Activated Clotting Time: 342 seconds
Activated Clotting Time: 256 seconds
Activated Clotting Time: 287 seconds
Activated Clotting Time: 201 seconds
Activated Clotting Time: 171 seconds
Activated Clotting Time: 189 seconds

## 2023-02-23 LAB — CREATININE, SERUM
Creatinine, Ser: 1.11 mg/dL (ref 0.61–1.24)
GFR, Estimated: >60 mL/min (ref >60)

## 2023-02-23 LAB — HEMOGLOBIN A1C
Hgb A1c MFr Bld: 6 % — ABNORMAL HIGH (ref 4.8–5.6)
Mean Plasma Glucose: 125.5 mg/dL

## 2023-02-23 LAB — I-STAT CG4 LACTIC ACID, ED: Lactic Acid, Venous: 2.7 mmol/L (ref 0.5–1.9)

## 2023-02-23 LAB — TROPONIN I (HIGH SENSITIVITY)
Troponin I (High Sensitivity): 66 ng/L — ABNORMAL HIGH (ref <18)
Troponin I (High Sensitivity): 10990 ng/L (ref <18)

## 2023-02-23 SURGERY — CORONARY/GRAFT ACUTE MI REVASCULARIZATION
Anesthesia: LOCAL

## 2023-02-23 MED ORDER — SODIUM CHLORIDE 0.9 % IV BOLUS
INTRAVENOUS | Status: DC | PRN
  Administered 2023-02-23 (×2): 250 mL via INTRAVENOUS

## 2023-02-23 MED ORDER — SODIUM CHLORIDE 0.9 % IV SOLN: INTRAVENOUS | Status: AC

## 2023-02-23 MED ORDER — SODIUM CHLORIDE 0.9% FLUSH: 3.0000 mL | INTRAVENOUS | Status: DC | PRN

## 2023-02-23 MED ORDER — TICAGRELOR 90 MG PO TABS
90.0000 mg | ORAL_TABLET | Freq: Two times a day (BID) | ORAL | Status: DC
  Administered 2023-02-24 – 2023-02-25 (×3): 90 mg via ORAL
  Filled 2023-02-23 (×3): qty 1

## 2023-02-23 MED ORDER — NITROGLYCERIN 1 MG/10 ML FOR IR/CATH LAB
INTRA_ARTERIAL | Status: AC
  Filled 2023-02-23: qty 10

## 2023-02-23 MED ORDER — MIDAZOLAM HCL 2 MG/2ML IJ SOLN
INTRAMUSCULAR | Status: DC | PRN
  Administered 2023-02-23 (×2): 1 mg via INTRAVENOUS

## 2023-02-23 MED ORDER — SIMVASTATIN 20 MG PO TABS
40.0000 mg | ORAL_TABLET | Freq: Every day | ORAL | Status: DC
  Administered 2023-02-24: 40 mg via ORAL
  Filled 2023-02-23: qty 2

## 2023-02-23 MED ORDER — ENOXAPARIN SODIUM 40 MG/0.4ML IJ SOSY: 40.0000 mg | PREFILLED_SYRINGE | INTRAMUSCULAR | Status: DC

## 2023-02-23 MED ORDER — HYDRALAZINE HCL 20 MG/ML IJ SOLN: 10.0000 mg | INTRAMUSCULAR | Status: AC | PRN

## 2023-02-23 MED ORDER — SODIUM CHLORIDE 0.9% FLUSH
3.0000 mL | Freq: Two times a day (BID) | INTRAVENOUS | Status: DC
  Administered 2023-02-24 – 2023-02-25 (×2): 3 mL via INTRAVENOUS

## 2023-02-23 MED ORDER — VERAPAMIL HCL 2.5 MG/ML IV SOLN
INTRAVENOUS | Status: AC
  Filled 2023-02-23: qty 2

## 2023-02-23 MED ORDER — LIDOCAINE HCL (PF) 1 % IJ SOLN
INTRAMUSCULAR | Status: DC | PRN
  Administered 2023-02-23: 15 mL
  Administered 2023-02-23: 2 mL

## 2023-02-23 MED ORDER — HEPARIN (PORCINE) IN NACL 1000-0.9 UT/500ML-% IV SOLN
INTRAVENOUS | Status: DC | PRN
  Administered 2023-02-23 (×2): 500 mL

## 2023-02-23 MED ORDER — TICAGRELOR 90 MG PO TABS
ORAL_TABLET | ORAL | Status: DC | PRN
  Administered 2023-02-23: 180 mg via ORAL

## 2023-02-23 MED ORDER — LIDOCAINE HCL (PF) 1 % IJ SOLN
INTRAMUSCULAR | Status: AC
  Filled 2023-02-23: qty 30

## 2023-02-23 MED ORDER — ATORVASTATIN CALCIUM 80 MG PO TABS
80.0000 mg | ORAL_TABLET | Freq: Every day | ORAL | Status: DC
  Filled 2023-02-23: qty 1

## 2023-02-23 MED ORDER — SODIUM CHLORIDE 0.9 % IV SOLN
INTRAVENOUS | Status: DC | PRN
  Administered 2023-02-23: 10 mL/h via INTRAVENOUS

## 2023-02-23 MED ORDER — LABETALOL HCL 5 MG/ML IV SOLN: 10.0000 mg | INTRAVENOUS | Status: AC | PRN

## 2023-02-23 MED ORDER — VERAPAMIL HCL 2.5 MG/ML IV SOLN
INTRAVENOUS | Status: DC | PRN
  Administered 2023-02-23: 10 mL via INTRA_ARTERIAL

## 2023-02-23 MED ORDER — ONDANSETRON HCL 4 MG/2ML IJ SOLN: 4.0000 mg | Freq: Four times a day (QID) | INTRAMUSCULAR | Status: DC | PRN

## 2023-02-23 MED ORDER — HEPARIN SODIUM (PORCINE) 1000 UNIT/ML IJ SOLN
INTRAMUSCULAR | Status: DC | PRN
  Administered 2023-02-23: 2000 [IU] via INTRAVENOUS
  Administered 2023-02-23: 1500 [IU] via INTRAVENOUS
  Administered 2023-02-23: 8500 [IU] via INTRAVENOUS
  Administered 2023-02-23: 2000 [IU] via INTRAVENOUS

## 2023-02-23 MED ORDER — MIDAZOLAM HCL 2 MG/2ML IJ SOLN
INTRAMUSCULAR | Status: AC
  Filled 2023-02-23: qty 2

## 2023-02-23 MED ORDER — HEPARIN SODIUM (PORCINE) 5000 UNIT/ML IJ SOLN: 60.0000 [IU]/kg | Freq: Once | INTRAMUSCULAR | Status: DC

## 2023-02-23 MED ORDER — ACETAMINOPHEN 325 MG PO TABS: 650.0000 mg | ORAL_TABLET | ORAL | Status: DC | PRN

## 2023-02-23 MED ORDER — SODIUM CHLORIDE 0.9 % IV SOLN
INTRAVENOUS | Status: AC
Start: 2023-02-23 — End: ?

## 2023-02-23 MED ORDER — SODIUM CHLORIDE 0.9 % IV SOLN: 250.0000 mL | INTRAVENOUS | Status: DC | PRN

## 2023-02-23 MED ORDER — TICAGRELOR 90 MG PO TABS
ORAL_TABLET | ORAL | Status: AC
  Filled 2023-02-23: qty 2

## 2023-02-23 MED ORDER — HEPARIN SODIUM (PORCINE) 1000 UNIT/ML IJ SOLN
INTRAMUSCULAR | Status: AC
  Filled 2023-02-23: qty 10

## 2023-02-23 MED ORDER — IOHEXOL 350 MG/ML SOLN
INTRAVENOUS | Status: DC | PRN
  Administered 2023-02-23: 395 mL

## 2023-02-23 MED ORDER — NITROGLYCERIN IN D5W 200-5 MCG/ML-% IV SOLN
INTRAVENOUS | Status: DC | PRN
  Administered 2023-02-23: 10 ug/min via INTRAVENOUS

## 2023-02-23 MED ORDER — ASPIRIN 81 MG PO CHEW
81.0000 mg | CHEWABLE_TABLET | Freq: Every day | ORAL | Status: DC
  Administered 2023-02-24 – 2023-02-25 (×2): 81 mg via ORAL
  Filled 2023-02-23 (×2): qty 1

## 2023-02-23 MED ORDER — ASPIRIN 81 MG PO CHEW: 324.0000 mg | CHEWABLE_TABLET | Freq: Once | ORAL | Status: AC

## 2023-02-23 MED ORDER — TICAGRELOR 90 MG PO TABS
ORAL_TABLET | ORAL | Status: AC
  Filled 2023-02-23: qty 1

## 2023-02-23 MED ORDER — FENTANYL CITRATE (PF) 100 MCG/2ML IJ SOLN
INTRAMUSCULAR | Status: DC | PRN
  Administered 2023-02-23 (×2): 25 ug via INTRAVENOUS

## 2023-02-23 MED ORDER — NITROGLYCERIN IN D5W 200-5 MCG/ML-% IV SOLN: 0.0000 ug/min | INTRAVENOUS | Status: DC

## 2023-02-23 SURGICAL SUPPLY — 28 items
BALLN EMERGE MR 2.5X20 (BALLOONS) ×1
BALLN ~~LOC~~ EMERGE MR 3.75X20 (BALLOONS) ×1
BALLOON EMERGE MR 2.5X20 (BALLOONS) IMPLANT
BALLOON TAKERU 2.0X12 (BALLOONS) IMPLANT
BALLOON ~~LOC~~ EMERGE MR 3.75X20 (BALLOONS) IMPLANT
CATH 5FR JL3.5 JR4 ANG PIG MP (CATHETERS) IMPLANT
CATH LAUNCHER 6FR AL1 (CATHETERS) IMPLANT
CATH LAUNCHER 6FR AR1 (CATHETERS) IMPLANT
CATH LAUNCHER 6FR JR4 (CATHETERS) IMPLANT
CATH VISTA GUIDE 6FR 3DRC (CATHETERS) IMPLANT
CATHETER LAUNCHER 6FR AL1 (CATHETERS) ×1
DEVICE RAD COMP TR BAND LRG (VASCULAR PRODUCTS) IMPLANT
GLIDESHEATH SLEND SS 6F .021 (SHEATH) IMPLANT
GUIDEWIRE INQWIRE 1.5J.035X260 (WIRE) IMPLANT
INQWIRE 1.5J .035X260CM (WIRE) ×2
KIT ENCORE 26 ADVANTAGE (KITS) IMPLANT
KIT MICROPUNCTURE NIT STIFF (SHEATH) IMPLANT
PACK CARDIAC CATHETERIZATION (CUSTOM PROCEDURE TRAY) ×1 IMPLANT
PROTECTION STATION PRESSURIZED (MISCELLANEOUS) ×1
SET ATX-X65L (MISCELLANEOUS) IMPLANT
SHEATH PINNACLE 6F 10CM (SHEATH) IMPLANT
SHEATH PROBE COVER 6X72 (BAG) IMPLANT
STATION PROTECTION PRESSURIZED (MISCELLANEOUS) IMPLANT
STENT ONYX FRONTIER 3.5X38 (Permanent Stent) IMPLANT
TUBING CIL FLEX 10 FLL-RA (TUBING) IMPLANT
WIRE COUGAR XT STRL 190CM (WIRE) IMPLANT
WIRE EMERALD 3MM-J .035X150CM (WIRE) IMPLANT
WIRE MICROINTRODUCER 60CM (WIRE) IMPLANT

## 2023-02-23 NOTE — H&P (Addendum)
Cardiology Admission History and Physical   Patient ID: Raymond Stanley MRN: [id number]; DOB: 1946/03/07   Admission date: 02/23/2023  PCP:  Marden Noble, MD   [facility] Providers Cardiologist:  Dr. Darrold Junker  Chief Complaint:  Chest pain/STEMI  Patient Profile:   Raymond Stanley is a 77 y.o. male with HLD, PE, GERD, HTN, Chronic HFrEF/NICM, who is being seen 02/23/2023 for the evaluation of chest pain/STEMI.  History of Present Illness:   [name] is a 77 yo male with PMH noted above. He has been followed by cardiology at [facility].  Previous CT chest with left main and 3v disease with calcification of the aortic valve. Echo 07/2020 with LVEF of 35-40% with anterior, septal, and apical hypokinesis/mild mitral, tricuspid regurgitation. Subsequent lexiscan myoview showed LVEF of 41% with no evidence of scar or ischemia. Last seen in the office on 09/2021 and reported doing well.   Presented to the ED on 02/23/2023 via EMS. Developed acute onset of chest pain, diaphoresis, lightheadedness and EMS was called. On arrival EKG showed ST elevation in inferior leads. CODE STEMI called in the field. Given ASA and SL NTG. Brought to the cath lab emergently for cardiac cath.    Past Medical History:  Diagnosis Date   Actinic keratoses    BPH (benign prostatic hypertrophy)    ED (erectile dysfunction)    Esophageal reflux    Hearing loss    Other and unspecified hyperlipidemia    Premature atrial contractions    Pulmonary emboli (HCC)    Pulmonary emboli (HCC)    Skin cancer    Unspecified essential hypertension     Past Surgical History:  Procedure Laterality Date   EXCISION MASS NECK Right 05/08/2021   Procedure: EXCISION RIGHT SUPERFICIAL NECK MASS UNDER LOCAL ANESTHESIA;  Surgeon: Linus Salmons, MD;  Location: [facility];  Service: ENT;  Laterality: Right;   INGUINAL HERNIA REPAIR Right      Medications Prior to Admission: Prior to Admission medications    Medication Sig Start Date End Date Taking? Authorizing Provider  albuterol (VENTOLIN HFA) 108 (90 Base) MCG/ACT inhaler Inhale 2 puffs into the lungs every 6 (six) hours as needed for wheezing or shortness of breath. 09/29/20   Willy Eddy, MD  amLODipine (NORVASC) 5 MG tablet Take 5 mg by mouth daily.    [provider]  aspirin 81 MG tablet Take 81 mg by mouth daily.    [provider]  budesonide-formoterol (SYMBICORT) 160-4.5 MCG/ACT inhaler Inhale 1 puff into the lungs 2 (two) times daily for 1 day. 04/05/18 04/06/18  Kalman Shan, MD  diclofenac sodium (VOLTAREN) 1 % GEL Apply 2-4 g topically 4 (four) times daily. 10/12/16   Valeria Batman, MD  doxycycline (VIBRAMYCIN) 100 MG capsule Take 1 capsule (100 mg total) by mouth 2 (two) times daily. 08/02/22   Gustavus Bryant, FNP  HYDROcodone bit-homatropine (HYCODAN) 5-1.5 MG/5ML syrup Take 5 mLs by mouth every 6 (six) hours as needed for cough. 08/02/22   Gustavus Bryant, FNP  hydrOXYzine (ATARAX) 10 MG tablet Take 1 tablet (10 mg total) by mouth 3 (three) times daily as needed. 07/22/21   Mayers, Cari S, PA-C  losartan (COZAAR) 50 MG tablet Take 50 mg by mouth daily.    [provider]  metoprolol succinate (TOPROL-XL) 25 MG 24 hr tablet Take 25 mg by mouth daily.    [provider]  omeprazole (PRILOSEC) 20 MG capsule Take 20 mg by mouth  daily.    [provider]  sildenafil (VIAGRA) 100 MG tablet Take 100 mg by mouth daily as needed for erectile dysfunction.    [provider]  simvastatin (ZOCOR) 80 MG tablet Take 40 mg by mouth daily.     [provider]  TEMAZEPAM PO Take by mouth.    [provider]  triamcinolone ointment (KENALOG) 0.5 % Apply 1 application topically 2 (two) times daily. 07/22/21   Mayers, Cari S, PA-C  zaleplon (SONATA) 10 MG capsule Take 10 mg by mouth at bedtime as needed for sleep.    [provider]     Allergies:    Allergies   Allergen Reactions   Ace Inhibitors    Crestor [Rosuvastatin]    Norvasc [Amlodipine Besylate]    Carac [Fluorouracil] Rash    Social History:   Social History   Socioeconomic History   Marital status: Married    Spouse name: Not on file   Number of children: Not on file   Years of education: Not on file   Highest education level: Not on file  Occupational History   Not on file  Tobacco Use   Smoking status: Never   Smokeless tobacco: Never  Substance and Sexual Activity   Alcohol use: Yes    Comment: occasional   Drug use: No   Sexual activity: Not on file  Other Topics Concern   Not on file  Social History Narrative   Not on file   Social Determinants of Health   Financial Resource Strain: Not on file  Food Insecurity: Not on file  Transportation Needs: Not on file  Physical Activity: Not on file  Stress: Not on file  Social Connections: Unknown (10/11/2021)   Received from [facility]   Social Network    Social Network: Not on file  Intimate Partner Violence: Unknown (09/02/2021)   Received from [facility]   HITS    Physically Hurt: Not on file    Insult or Talk Down To: Not on file    Threaten Physical Harm: Not on file    Scream or Curse: Not on file    Family History:   The patient's family history includes CAD in his father, maternal grandmother, and mother; Heart disease in his brother; Hypertension in his brother, father, maternal grandmother, and mother.    ROS:  Please see the history of present illness.  All other ROS reviewed and negative.     Physical Exam/Data:   Vitals:   02/23/23 1340 02/23/23 1344 02/23/23 1349 02/23/23 1354  BP: (!) 164/99 (!) 171/101 (!) 164/114 (!) 173/101  Pulse: 73 72 73 65  Resp: 10 12 16 17   SpO2: 90% 99% 99% 98%  Weight:      Height:       No intake or output data in the 24 hours ending 02/23/23 1405    02/23/2023   12:37 PM 07/22/2021    8:48 AM 05/08/2021    9:33 AM   Last 3 Weights  Weight (lbs) 190 lb 187 lb 184 lb 15.5 oz  Weight (kg) 86.183 kg 84.823 kg 83.9 kg     Body mass index is 28.06 kg/m.  General:  Diaphoretic, pale HEENT: normal Neck: no JVD Vascular: No carotid bruits; Distal pulses 2+ bilaterally   Cardiac:  normal S1, S2; RRR; no murmur  Lungs:  clear to auscultation bilaterally, no wheezing, rhonchi or rales  Abd: soft, nontender, no hepatomegaly  Ext:  no edema Musculoskeletal:  No deformities, BUE and BLE strength normal and equal Neuro:  CNs 2-12 intact, no focal abnormalities noted  EKG:  The ECG that was done 02/23/2023 was personally reviewed and demonstrates sinus rhythm, ST elevation in inferior leads  Laboratory Data:  High Sensitivity Troponin:   Recent Labs  Lab 02/23/23 1145  TROPONINIHS 66*      Chemistry Recent Labs  Lab 02/23/23 1145  NA 138  K 3.2*  CL 105  CO2 21*  GLUCOSE 140*  BUN 15  CREATININE 1.20  CALCIUM 9.2  GFRNONAA >60  ANIONGAP 12    Recent Labs  Lab 02/23/23 1145  PROT 5.9*  ALBUMIN 3.5  AST 20  ALT 15  ALKPHOS 74  BILITOT 1.0   Lipids No results for input(s): "CHOL", "TRIG", "HDL", "LABVLDL", "LDLCALC", "CHOLHDL" in the last 168 hours. Hematology Recent Labs  Lab 02/23/23 1145  WBC 5.9  RBC 4.49  HGB 13.3  HCT 41.3  MCV 92.0  MCH 29.6  MCHC 32.2  RDW 13.2  PLT 159   Thyroid No results for input(s): "TSH", "FREET4" in the last 168 hours. BNPNo results for input(s): "BNP", "PROBNP" in the last 168 hours.  DDimer No results for input(s): "DDIMER" in the last 168 hours.   Radiology/Studies:  No results found.   Assessment and Plan:   PRESLEY GOOSMAN is a 77 y.o. male with HLD, PE, GERD, HTN and Chronic HFrEF/NICM who is being seen 02/23/2023 for the evaluation of chest pain/STEMI.  STEMI -- presented with sudden onset of diaphoresis, lightheadedness and chest pain. On EMS arrival, noted to have ST elevation in inferior leads.  CODE STEMI called in the  field. -- brought directly to the cath lab for emergent cardiac cath -- further recommendations pending cath   HFrEF NICM -- echo 04/2022 with LVEF of 40%, mildly enlarged RV, mild MR/TR -- PTA meds include Toprol XL 25mg  daily, losartan 50mg  daily, amlodipine 10mg  daily  HTN -- blood pressures elevated on arrival -- PTA meds Toprol XL 25mg  daily, losartan 50mg  daily, amlodipine 10mg  daily  HLD -- has been statin intolerant -- check lipids  Risk Assessment/Risk Scores:   TIMI Risk Score for ST  Elevation MI:   The patient's TIMI risk score is 3, which indicates a 4.4% risk of all cause mortality at 30 days.  Code Status: Full Code  Severity of Illness: The appropriate patient status for this patient is INPATIENT. Inpatient status is judged to be reasonable and necessary in order to provide the required intensity of service to ensure the patient's safety. The patient's presenting symptoms, physical exam findings, and initial radiographic and laboratory data in the context of their chronic comorbidities is felt to place them at high risk for further clinical deterioration. Furthermore, it is not anticipated that the patient will be medically stable for discharge from the hospital within 2 midnights of admission.   * I certify that at the point of admission it is my clinical judgment that the patient will require inpatient hospital care spanning beyond 2 midnights from the point of admission due to high intensity of service, high risk for further deterioration and high frequency of surveillance required.*   For questions or updates, please contact [facility] Please consult [email or web address] for contact info under     Signed, Laverda Page, NP  02/23/2023 2:05 PM    Patient seen and examined. Agree with assessment and plan.  Mr. Colocho is a 77 year old gentleman who has  been followed at the [place] clinic.  He has previous documentation of artery calcification 3V CAD.  In  February 2022 he was found to have reduced LV function with EF estimate of 35 to 40%.  Subsequent Myoview study showed EF 41% without scar or ischemia.  Patient developed new onset chest discomfort with left arm radiation today.  Chest pain persisted.  Pain was associated with diaphoresis.  He was found to have mild ST elevation inferolaterally leading to code STEMI activation.  Urgent catheterization revealed diffuse multi-vessel coronary calcification.  The catheterization procedure was very difficult but ultimately he was found to have complete occlusion of the mid distal PDA as well as severe mid to mid distal RCA stenoses.  He underwent successful PTCA with reopening of his PDA occlusion and stenting of his mid RCA stenoses with insertion of a 3.5 x 38 mm DES stent postdilated to 3.78 mm.  Plan DAPT with aspirin/ticagrelor for at least 12 months.  Plan 2D echo Doppler study for assessment of LV function.  Medical therapy for concomitant CAD.  Patient cannot tolerate statins, initiate PCSK9 inhibition. Guideline directed medical therapy forHFrEF.   Lennette Bihari, MD, [facility] 02/23/2023 4:57 PM

## 2023-02-23 NOTE — Progress Notes (Addendum)
Site area: Right groin a 6 french arterial sheath was removed  Site Prior to Removal:  Level 0  Pressure Applied For 45 MINUTES    Bedrest Beginning at 1815p X 4 hours  Manual:   Yes.    Patient Status During Pull:  stable  Post Pull Groin Site:  Level 0  Post Pull Instructions Given:  Yes.    Post Pull Pulses Present:  Yes.    Dressing Applied:  Yes.    Comments:

## 2023-02-23 NOTE — Progress Notes (Addendum)
TR BAND REMOVAL  LOCATION:    Right radial  DEFLATED PER PROTOCOL:    Yes.    TIME BAND OFF / DRESSING APPLIED:    1820p a clean dry dressing applied with gauze and tegaderm.   SITE UPON ARRIVAL:    Level 0  SITE AFTER BAND REMOVAL:    Level 0  CIRCULATION SENSATION AND MOVEMENT:    Within Normal Limits   Yes.    COMMENTS:   Care instructions given to patient

## 2023-02-23 NOTE — ED Provider Notes (Signed)
[place] EMERGENCY DEPARTMENT AT [facility] Provider Note   CSN: [id number] Arrival date & time:        History  No chief complaint on file.   Raymond Stanley is a 77 y.o. male.  HPI Seen on arrival after I was made aware of his inbound status by EMS.  Patient states he feels somewhat better, but notes that he felt severe sudden onset pain with diaphoresis, lightheadedness prior to EMS notification.  He received nitroglycerin, had episode of vomiting, was pale, diaphoretic, but notes that his pain improved.  EMS reports the patient's initial EKG was concerning for ST elevations, normalized somewhat, but again had elevations just prior to ED arrival. Patient notes a history of hiatal hernia, no recent cardiac issues, does have a history of hypertension.      Home Medications Prior to Admission medications   Medication Sig Start Date End Date Taking? Authorizing Provider  albuterol (VENTOLIN HFA) 108 (90 Base) MCG/ACT inhaler Inhale 2 puffs into the lungs every 6 (six) hours as needed for wheezing or shortness of breath. 09/29/20   Willy Eddy, MD  amLODipine (NORVASC) 5 MG tablet Take 5 mg by mouth daily.    [provider]  aspirin 81 MG tablet Take 81 mg by mouth daily.    [provider]  budesonide-formoterol (SYMBICORT) 160-4.5 MCG/ACT inhaler Inhale 1 puff into the lungs 2 (two) times daily for 1 day. 04/05/18 04/06/18  Kalman Shan, MD  diclofenac sodium (VOLTAREN) 1 % GEL Apply 2-4 g topically 4 (four) times daily. 10/12/16   Valeria Batman, MD  doxycycline (VIBRAMYCIN) 100 MG capsule Take 1 capsule (100 mg total) by mouth 2 (two) times daily. 08/02/22   Gustavus Bryant, FNP  HYDROcodone bit-homatropine (HYCODAN) 5-1.5 MG/5ML syrup Take 5 mLs by mouth every 6 (six) hours as needed for cough. 08/02/22   Gustavus Bryant, FNP  hydrOXYzine (ATARAX) 10 MG tablet Take 1 tablet (10 mg total) by mouth 3 (three) times daily as needed. 07/22/21   Mayers,  Cari S, PA-C  losartan (COZAAR) 50 MG tablet Take 50 mg by mouth daily.    [provider]  metoprolol succinate (TOPROL-XL) 25 MG 24 hr tablet Take 25 mg by mouth daily.    [provider]  omeprazole (PRILOSEC) 20 MG capsule Take 20 mg by mouth daily.    [provider]  sildenafil (VIAGRA) 100 MG tablet Take 100 mg by mouth daily as needed for erectile dysfunction.    [provider]  simvastatin (ZOCOR) 80 MG tablet Take 40 mg by mouth daily.     [provider]  TEMAZEPAM PO Take by mouth.    [provider]  triamcinolone ointment (KENALOG) 0.5 % Apply 1 application topically 2 (two) times daily. 07/22/21   Mayers, Cari S, PA-C  zaleplon (SONATA) 10 MG capsule Take 10 mg by mouth at bedtime as needed for sleep.    [provider]      Allergies    Ace inhibitors, Crestor [rosuvastatin], Norvasc [amlodipine besylate], and Carac [fluorouracil]    Review of Systems   Review of Systems  Unable to perform ROS: Acuity of condition    Physical Exam Updated Vital Signs There were no vitals taken for this visit. Physical Exam Vitals and nursing note reviewed.  Constitutional:      Appearance: He is well-developed.     Comments: Uncomfortable appearing elderly male sitting upright speaking clearly  HENT:  Head: Normocephalic and atraumatic.  Eyes:     Conjunctiva/sclera: Conjunctivae normal.  Cardiovascular:     Rate and Rhythm: Normal rate and regular rhythm.  Pulmonary:     Effort: Pulmonary effort is normal. No respiratory distress.     Breath sounds: No stridor.  Abdominal:     General: There is no distension.  Skin:    General: Skin is warm and dry.  Neurological:     Mental Status: He is alert and oriented to person, place, and time.     ED Results / Procedures / Treatments   Labs (all labs ordered are listed, but only abnormal results are displayed) Labs Reviewed  HEMOGLOBIN A1C  CBC WITH  DIFFERENTIAL/PLATELET  PROTIME-INR  APTT  COMPREHENSIVE METABOLIC PANEL  LIPID PANEL  I-STAT CG4 LACTIC ACID, ED  TROPONIN I (HIGH SENSITIVITY)    EKG None  Radiology No results found.  Procedures Procedures    Medications Ordered in ED Medications  0.9 %  sodium chloride infusion (has no administration in time range)  aspirin chewable tablet 324 mg (has no administration in time range)  heparin injection 60 Units/kg (has no administration in time range)    ED Course/ Medical Decision Making/ A&P                                 Medical Decision Making Male with history of hypertension presents after acute onset chest pain.  EKG from the field reviewed x 2, concerning for ST changes, and given his description of severe pain, diaphoresis, abnormal EKG, patient had been diagnosed negative as a code STEMI.  Labs placed per cardiology protocol, patient had IV access obtained and he was sent immediately to the catheterization lab.  Amount and/or Complexity of Data Reviewed Independent Historian: EMS External Data Reviewed: ECG and notes.    Details: EMS and I discussed the case and I reviewed their EKGs x 2. Labs: ordered.  Risk OTC drugs. Prescription drug management. Decision regarding hospitalization.  Final Clinical Impression(s) / ED Diagnoses Final diagnoses:  ACS (acute coronary syndrome) [facility])    Rx / DC Orders ED Discharge Orders     None         Gerhard Munch, MD 02/23/23 1148

## 2023-02-24 ENCOUNTER — Encounter (HOSPITAL_COMMUNITY): Payer: Self-pay | Admitting: Cardiovascular Disease

## 2023-02-24 ENCOUNTER — Other Ambulatory Visit (HOSPITAL_COMMUNITY): Payer: Self-pay

## 2023-02-24 ENCOUNTER — Other Ambulatory Visit: Payer: Self-pay

## 2023-02-24 DIAGNOSIS — I2111 ST elevation (STEMI) myocardial infarction involving right coronary artery: Secondary | ICD-10-CM | POA: Diagnosis not present

## 2023-02-24 LAB — CBC
HCT: 39.7 % (ref 39.0–52.0)
Hemoglobin: 13 g/dL (ref 13.0–17.0)
MCH: 29.8 pg (ref 26.0–34.0)
MCHC: 32.7 g/dL (ref 30.0–36.0)
MCV: 91.1 fL (ref 80.0–100.0)
Platelets: 151 10*3/uL (ref 150–400)
RBC: 4.36 MIL/uL (ref 4.22–5.81)
RDW: 13.6 % (ref 11.5–15.5)
WBC: 7.9 10*3/uL (ref 4.0–10.5)
nRBC: 0 % (ref 0.0–0.2)

## 2023-02-24 LAB — BASIC METABOLIC PANEL
Anion gap: 6 (ref 5–15)
BUN: 14 mg/dL (ref 8–23)
CO2: 24 mmol/L (ref 22–32)
Calcium: 8.8 mg/dL — ABNORMAL LOW (ref 8.9–10.3)
Chloride: 105 mmol/L (ref 98–111)
Creatinine, Ser: 1.2 mg/dL (ref 0.61–1.24)
GFR, Estimated: >60 mL/min (ref >60)
Glucose, Bld: 108 mg/dL — ABNORMAL HIGH (ref 70–99)
Potassium: 3.9 mmol/L (ref 3.5–5.1)
Sodium: 135 mmol/L (ref 135–145)

## 2023-02-24 LAB — MAGNESIUM: Magnesium: 1.9 mg/dL (ref 1.7–2.4)

## 2023-02-24 LAB — LIPOPROTEIN A (LPA): Lipoprotein (a): 8.8 nmol/L (ref <75.0)

## 2023-02-24 MED ORDER — CHLORHEXIDINE GLUCONATE CLOTH 2 % EX PADS
6.0000 | MEDICATED_PAD | Freq: Every day | CUTANEOUS | Status: DC
  Administered 2023-02-24 – 2023-02-25 (×2): 6 via TOPICAL

## 2023-02-24 MED ORDER — EZETIMIBE 10 MG PO TABS
10.0000 mg | ORAL_TABLET | Freq: Every day | ORAL | Status: DC
  Administered 2023-02-24 – 2023-02-25 (×2): 10 mg via ORAL
  Filled 2023-02-24 (×2): qty 1

## 2023-02-24 MED ORDER — LOSARTAN POTASSIUM 25 MG PO TABS
25.0000 mg | ORAL_TABLET | Freq: Every day | ORAL | Status: DC
  Administered 2023-02-24 – 2023-02-25 (×2): 25 mg via ORAL
  Filled 2023-02-24 (×2): qty 1

## 2023-02-24 MED ORDER — METOPROLOL SUCCINATE ER 25 MG PO TB24
25.0000 mg | ORAL_TABLET | Freq: Every day | ORAL | Status: DC
  Administered 2023-02-24 – 2023-02-25 (×2): 25 mg via ORAL
  Filled 2023-02-24 (×2): qty 1

## 2023-02-24 MED ORDER — TEMAZEPAM 7.5 MG PO CAPS
15.0000 mg | ORAL_CAPSULE | Freq: Every evening | ORAL | Status: DC | PRN
  Administered 2023-02-24: 15 mg via ORAL
  Filled 2023-02-24: qty 2

## 2023-02-24 MED FILL — Nitroglycerin IV Soln 100 MCG/ML in D5W: INTRA_ARTERIAL | Qty: 10 | Status: AC

## 2023-02-24 NOTE — Progress Notes (Addendum)
   Patient Name: Raymond Stanley Date of Encounter: 02/24/2023 [facility] Cardiologist: Tresa Endo  Interval Summary  .    S/p PTCA of distal RCA (culprit) and DES mid RCA yesterday; LVEDP 21  Overnight, a few episodes of NSVT  Patient without complaints; radial and RFA site stable  Vital Signs .    Vitals:   02/23/23 2215 02/23/23 2230 02/24/23 0000 02/24/23 0603  BP:  108/72  (!) 154/92  Pulse: 80 82  74  Resp: 11 14  20   Temp:   (!) 97.3 F (36.3 C)   TempSrc:   Axillary   SpO2: 95% 95%  94%  Weight:      Height:        Intake/Output Summary (Last 24 hours) at 02/24/2023 0649 Last data filed at 02/24/2023 0544 Gross per 24 hour  Intake 1460.96 ml  Output 1406 ml  Net 54.96 ml      02/23/2023   12:37 PM 07/22/2021    8:48 AM 05/08/2021    9:33 AM  Last 3 Weights  Weight (lbs) 190 lb 187 lb 184 lb 15.5 oz  Weight (kg) 86.183 kg 84.823 kg 83.9 kg      Telemetry/ECG    SR with NSVT, PVCs - Personally Reviewed  Physical Exam .   GEN: No acute distress.   Neck: No JVD Cardiac: RRR, no murmurs, rubs, or gallops.  Respiratory: Clear to auscultation bilaterally. GI: Soft, nontender, non-distended  MS: No edema; R wrist and R groin stable  Assessment & Plan .     Inferior STEMI.:  Status post balloon angioplasty of distal right coronary artery with residual high-grade stenosis and PCI of mid right coronary artery with mild to moderate disease elsewhere.  Continue dual antiplatelet therapy with aspirin and Brilinta.  Will start Toprol-XL 25 mg every afternoon.  Start as needed nitroglycerin.  The patient is intolerant of Lipitor and Crestor.  Continue simvastatin 40 mg and add Zetia 10 mg.  Follow-up echocardiogram today.  Will transfer out of the unit.  Likely discharge tomorrow.  Discontinue enoxaparin; ambulate. Hyperlipidemia: Continue simvastatin 40 mg and add Zetia at 10 mg; goal LDL is now less than 55 will refer to lipid clinic as outpatient. Hypertension:  Start Toprol XL 25 mg and losartan 25 mg. NSVT: Start Toprol and keep magnesium potassium stable.  Add mag level on to labs today.  CRITICAL CARE Performed by: Orbie Pyo   Total critical care time: 30 minutes  Critical care time was exclusive of separately billable procedures and treating other patients.  Critical care was necessary to treat or prevent imminent or life-threatening deterioration.  Critical care was time spent personally by me on the following activities: development of treatment plan with patient and/or surrogate as well as nursing, discussions with consultants, evaluation of patient's response to treatment, examination of patient, obtaining history from patient or surrogate, ordering and performing treatments and interventions, ordering and review of laboratory studies, ordering and review of radiographic studies, pulse oximetry and re-evaluation of patient's condition.   For questions or updates, please contact [facility] Please consult [email or web address] for contact info under        Signed, Orbie Pyo, MD

## 2023-02-24 NOTE — TOC Benefit Eligibility Note (Signed)
Pharmacy Patient Advocate Encounter  Insurance verification completed.    The patient is insured through  [place] [employer]    Ran test claim for Brilinta. Currently a quantity of 60 is a 30 day supply and the co-pay is $47.00 .   This test claim was processed through [facility]- copay amounts may vary at other pharmacies due to pharmacy/plan contracts, or as the patient moves through the different stages of their insurance plan.

## 2023-02-24 NOTE — Progress Notes (Signed)
Pt received orders to transfer to another floor during education, will f/u once pt has settled out of ICU.

## 2023-02-24 NOTE — Progress Notes (Signed)
Mobility Specialist Progress Note:   02/24/23 1400  Mobility  Activity Ambulated independently in hallway  Level of Assistance Standby assist, set-up cues, supervision of patient - no hands on  Assistive Device None  Distance Ambulated (ft) 940 ft  Activity Response Tolerated well  Mobility Referral Yes  $Mobility charge 1 Mobility  Mobility Specialist Start Time (ACUTE ONLY) 1404  Mobility Specialist Stop Time (ACUTE ONLY) 1417  Mobility Specialist Time Calculation (min) (ACUTE ONLY) 13 min    Pre Mobility: 70 HR During Mobility: 90 HR Post Mobility:  86 HR  Received pt in bed having no complaints and agreeable to mobility. Pt was asymptomatic throughout ambulation and returned to room w/o fault. Left on EOB w/ call bell in reach and all needs met.   Raymond Stanley Mobility Specialist Please contact via Special educational needs teacher or Rehab office at [phone]

## 2023-02-24 NOTE — Plan of Care (Signed)
Problem: Education: Goal: Understanding of CV disease, CV risk reduction, and recovery process will improve Outcome: Progressing Goal: Individualized Educational Video(s) Outcome: Progressing   Problem: Activity: Goal: Ability to return to baseline activity level will improve Outcome: Progressing   Problem: Cardiovascular: Goal: Ability to achieve and maintain adequate cardiovascular perfusion will improve Outcome: Progressing Goal: Vascular access site(s) Level 0-1 will be maintained Outcome: Progressing   Problem: Health Behavior/Discharge Planning: Goal: Ability to safely manage health-related needs after discharge will improve Outcome: Progressing   Problem: Education: Goal: Knowledge of General Education information will improve Description: Including pain rating scale, medication(s)/side effects and non-pharmacologic comfort measures Outcome: Progressing   Problem: Health Behavior/Discharge Planning: Goal: Ability to manage health-related needs will improve Outcome: Progressing   Problem: Clinical Measurements: Goal: Ability to maintain clinical measurements within normal limits will improve Outcome: Progressing Goal: Will remain free from infection Outcome: Progressing Goal: Diagnostic test results will improve Outcome: Progressing Goal: Respiratory complications will improve Outcome: Progressing Goal: Cardiovascular complication will be avoided Outcome: Progressing   Problem: Activity: Goal: Risk for activity intolerance will decrease Outcome: Progressing   Problem: Nutrition: Goal: Adequate nutrition will be maintained Outcome: Progressing   Problem: Coping: Goal: Level of anxiety will decrease Outcome: Progressing   Problem: Elimination: Goal: Will not experience complications related to bowel motility Outcome: Progressing Goal: Will not experience complications related to urinary retention Outcome: Progressing   Problem: Pain Managment: Goal:  General experience of comfort will improve Outcome: Progressing   Problem: Safety: Goal: Ability to remain free from injury will improve Outcome: Progressing   Problem: Skin Integrity: Goal: Risk for impaired skin integrity will decrease Outcome: Progressing

## 2023-02-25 ENCOUNTER — Telehealth: Payer: Self-pay | Admitting: Cardiology

## 2023-02-25 ENCOUNTER — Other Ambulatory Visit (HOSPITAL_COMMUNITY): Payer: Self-pay

## 2023-02-25 DIAGNOSIS — I2111 ST elevation (STEMI) myocardial infarction involving right coronary artery: Secondary | ICD-10-CM | POA: Diagnosis not present

## 2023-02-25 DIAGNOSIS — I10 Essential (primary) hypertension: Secondary | ICD-10-CM | POA: Insufficient documentation

## 2023-02-25 DIAGNOSIS — E785 Hyperlipidemia, unspecified: Secondary | ICD-10-CM | POA: Insufficient documentation

## 2023-02-25 MED ORDER — EZETIMIBE 10 MG PO TABS
10.0000 mg | ORAL_TABLET | Freq: Every day | ORAL | 3 refills | Status: DC
Start: 2023-02-26 — End: 2023-03-04
  Filled 2023-02-25: qty 31, 31d supply, fill #0

## 2023-02-25 MED ORDER — NITROGLYCERIN 0.4 MG SL SUBL
0.4000 mg | SUBLINGUAL_TABLET | SUBLINGUAL | 1 refills | Status: AC | PRN
  Filled 2023-02-25: qty 25, 7d supply, fill #0

## 2023-02-25 MED ORDER — TICAGRELOR 90 MG PO TABS
90.0000 mg | ORAL_TABLET | Freq: Two times a day (BID) | ORAL | 11 refills | Status: DC
Start: 2023-02-25 — End: 2023-03-04
  Filled 2023-02-25: qty 60, 30d supply, fill #0

## 2023-02-25 NOTE — Telephone Encounter (Signed)
Patient to be discharged today

## 2023-02-25 NOTE — Discharge Summary (Addendum)
Discharge Summary    Patient ID: Raymond Stanley MRN: [id number]; DOB: 02-Aug-1945  Admit date: 02/23/2023 Discharge date: 02/25/2023  PCP:  Marden Noble, MD   [facility] Providers Cardiologist:  Previously Paraschos, transition to Dr. Jacinto Halim   Discharge Diagnoses    Principal Problem:   STEMI involving right coronary artery [facility]) Active Problems:   Hypertension   Hyperlipidemia  Diagnostic Studies/Procedures    Cath: 02/25/2023    Prox LAD to Mid LAD lesion is 50% stenosed.   Mid LAD lesion is 60% stenosed.   1st Sept lesion is 50% stenosed.   Ost Cx to Prox Cx lesion is 30% stenosed.   Mid Cx lesion is 40% stenosed.   3rd Mrg lesion is 40% stenosed.   RPDA-2 lesion is 100% stenosed.   Dist RCA lesion is 30% stenosed.   RPDA-1 lesion is 30% stenosed.   Mid RCA lesion is 85% stenosed.   Prox RCA to Mid RCA lesion is 75% stenosed.   Ost RCA lesion is 20% stenosed.   A drug-eluting stent was successfully placed.   Post intervention, there is a 80% residual stenosis.   Post intervention, there is a 0% residual stenosis.   Post intervention, there is a 0% residual stenosis.   Recommend uninterrupted dual antiplatelet therapy with Aspirin 81mg  daily and Ticagrelor 90mg  twice daily for a minimum of 12 months (ACS-Class I recommendation).   Acute ST segment elevation myocardial infarction secondary to total mid distal PDA occlusion in a dominant RCA with high-grade mid stenoses.   Severe multivessel coronary calcification with 50 and 60% proximal LAD stenoses, irregularity of the left circumflex vessel with 30 to 40% stenoses.   Dominant RCA with posterior takeoff with 20% ostial narrowing followed by diffuse 75% to mid stenoses with 85% focal eccentric stenosis before the acute margin, 30 and 40% distal stenoses with total occlusion of the mid PDA.   Successful PCI to the RCA with PTCA of the totally occluded mid distal PDA with restoration of TIMI-3 flow and  diffusely narrowed 80% small caliber vessel stenting to the apex.  Successful PCI to the segmental 75 and 85% mid RCA stenoses with ultimate insertion of a 3.5 x 38 mm Onyx frontier stent postdilated to 3.78 mm with the stenoses being reduced to 0%.   Difficult procedure secondary to aorta takeoff and posterior takeoff of the RCA.   LVEDP 23 mmHg   RECOMMENDATION: DAPT with aspirin/Brilinta for minimum of 1 year.  Medical therapy for concomitant CAD.  Will try to rechallenge statin, if intolerant consider PCSK9 inhibition.  2D echo Doppler study.  Guideline directed medical therapy for HFrEF.    Diagnostic Dominance: Right  Intervention    _____________   History of Present Illness     Raymond Stanley is a 77 y.o. male with HLD, PE, GERD, HTN, Chronic HFrEF/NICM, who was seen 02/23/2023 for the evaluation of chest pain/STEMI. He has been followed by cardiology at [facility].  Previous CT chest with left main and 3v disease with calcification of the aortic valve. Echo 07/2020 with LVEF of 35-40% with anterior, septal, and apical hypokinesis/mild mitral, tricuspid regurgitation. Subsequent lexiscan myoview showed LVEF of 41% with no evidence of scar or ischemia. Last seen in the office on 09/2021 and reported doing well.    Presented to the ED on 02/23/2023 via EMS. Developed acute onset of chest pain, diaphoresis, lightheadedness and EMS was called. On arrival EKG showed ST elevation in inferior leads. CODE STEMI  called in the field. Given ASA and SL NTG. Brought to the cath lab emergently for cardiac cath.   Hospital Course     Inferior STEMI -- Underwent cardiac catheterization noted above with acute occlusion of mid/distal PDA and a dominant RCA with high-grade mid stenosis.  Successful PCI of the RCA to totally occluded mid distal PDA with DES to p/m vessel CAD.  Recommendations for DAPT with aspirin/Brilinta for at least 1 year.  Medical therapy for residual LAD and circumflex disease.  No  recurrent chest pain.  Seen by cardiac rehab.  It was recommended that the patient undergo echocardiogram prior to discharge but to the delay he wished to be discharged home with outpatient echo.  -- Continue aspirin, Brilinta, Toprol XL 25 mg daily, resume losartan 50 mg daily and amlodipine 5 mg daily  HLD -- LDL 104, HDL 36 -- Has been statin intolerant in the past, currently on simvastatin 40 mg daily.  Zetia added -- Consider addition of PCSK9 as an outpatient  HTN -- Elevated -- Continue Toprol XL 25 mg daily, resume losartan 50 mg daily and amlodipine 5 mg daily at discharge  NSVT -- Resolved -- Continue Toprol XL  General: Well developed, well nourished, male appearing in no acute distress. Head: Normocephalic, atraumatic.  Neck: Supple without bruits, JVD. Lungs:  Resp regular and unlabored, CTA. Heart: RRR, S1, S2, no S3, S4, or murmur; no rub. Abdomen: Soft, non-tender, non-distended with normoactive bowel sounds. No hepatomegaly. No rebound/guarding. No obvious abdominal masses. Extremities: No clubbing, cyanosis, edema. Distal pedal pulses are 2+ bilaterally. Right femoral/radial cath site stable without bruising or hematoma Neuro: Alert and oriented X 3. Moves all extremities spontaneously. Psych: Normal affect.  Patient was seen by Dr. Lynnette Caffey and deemed stable for discharge home. Follow up arranged in the office. Medications sent to the [facility] pharmacy. Educated by pharmD prior to DC.   Did the patient have an acute coronary syndrome (MI, NSTEMI, STEMI, etc) this admission?:  Yes                               AHA/ACC ACS Clinical Performance & Quality Measures: Aspirin prescribed? - Yes ADP Receptor Inhibitor (Plavix/Clopidogrel, Brilinta/Ticagrelor or Effient/Prasugrel) prescribed (includes medically managed patients)? - Yes Beta Blocker prescribed? - Yes High Intensity Statin (Lipitor 40-80mg  or Crestor 20-40mg ) prescribed? - No - intolerant EF assessed during THIS  hospitalization? - No - Outpatient Echocardiogram will be scheduled to assess EF. For EF <40%, was ACEI/ARB prescribed? - No - Outpatient Echocardiogram to assess EF will be scheduled. For EF <40%, Aldosterone Antagonist (Spironolactone or Eplerenone) prescribed? - No - Outpatient Echocardiogram to assess EF will be scheduled. Cardiac Rehab Phase II ordered (including medically managed patients)? - Yes     The patient will be scheduled for a TOC follow up appointment in 10-14 days.  A message has been sent to the [facility] and Scheduling Pool at the office where the patient should be seen for follow up.  _____________  Discharge Vitals Blood pressure (!) 153/96, pulse 70, temperature 98.3 F (36.8 C), temperature source Oral, resp. rate 17, height 5\' 9"  (1.753 m), weight 86.2 kg, SpO2 97%.  Filed Weights   02/23/23 1237  Weight: 86.2 kg    Labs & Radiologic Studies    CBC Recent Labs    02/23/23 1145 02/23/23 1212 02/23/23 1945 02/24/23 0252  WBC 5.9  --  7.0 7.9  NEUTROABS  2.7  --   --   --   HGB 13.3   < > 13.8 13.0  HCT 41.3   < > 40.5 39.7  MCV 92.0  --  90.6 91.1  PLT 159  --  67* 151   < > = values in this interval not displayed.   Basic Metabolic Panel Recent Labs    69/62/95 1145 02/23/23 1212 02/23/23 1945 02/24/23 0252  NA 138 139  --  135  K 3.2* 3.7  --  3.9  CL 105 105  --  105  CO2 21*  --   --  24  GLUCOSE 140* 150*  --  108*  BUN 15 16  --  14  CREATININE 1.20 1.00 1.11 1.20  CALCIUM 9.2  --   --  8.8*  MG  --   --   --  1.9   Liver Function Tests Recent Labs    02/23/23 1145  AST 20  ALT 15  ALKPHOS 74  BILITOT 1.0  PROT 5.9*  ALBUMIN 3.5   No results for input(s): "LIPASE", "AMYLASE" in the last 72 hours. High Sensitivity Troponin:   Recent Labs  Lab 02/23/23 1145 02/23/23 1945  TROPONINIHS 66* 10,990*    BNP Invalid input(s): "POCBNP" D-Dimer No results for input(s): "DDIMER" in the last 72 hours. Hemoglobin A1C Recent Labs     02/23/23 1145  HGBA1C 6.0*   Fasting Lipid Panel Recent Labs    02/23/23 1145  CHOL 161  HDL 36*  LDLCALC 104*  TRIG 104  CHOLHDL 4.5   Thyroid Function Tests No results for input(s): "TSH", "T4TOTAL", "T3FREE", "THYROIDAB" in the last 72 hours.  Invalid input(s): "FREET3" _____________  CARDIAC CATHETERIZATION  Result Date: 02/23/2023   Prox LAD to Mid LAD lesion is 50% stenosed.   Mid LAD lesion is 60% stenosed.   1st Sept lesion is 50% stenosed.   Ost Cx to Prox Cx lesion is 30% stenosed.   Mid Cx lesion is 40% stenosed.   3rd Mrg lesion is 40% stenosed.   RPDA-2 lesion is 100% stenosed.   Dist RCA lesion is 30% stenosed.   RPDA-1 lesion is 30% stenosed.   Mid RCA lesion is 85% stenosed.   Prox RCA to Mid RCA lesion is 75% stenosed.   Ost RCA lesion is 20% stenosed.   A drug-eluting stent was successfully placed.   Post intervention, there is a 80% residual stenosis.   Post intervention, there is a 0% residual stenosis.   Post intervention, there is a 0% residual stenosis.   Recommend uninterrupted dual antiplatelet therapy with Aspirin 81mg  daily and Ticagrelor 90mg  twice daily for a minimum of 12 months (ACS-Class I recommendation). Acute ST segment elevation myocardial infarction secondary to total mid distal PDA occlusion in a dominant RCA with high-grade mid stenoses. Severe multivessel coronary calcification with 50 and 60% proximal LAD stenoses, irregularity of the left circumflex vessel with 30 to 40% stenoses. Dominant RCA with posterior takeoff with 20% ostial narrowing followed by diffuse 75% to mid stenoses with 85% focal eccentric stenosis before the acute margin, 30 and 40% distal stenoses with total occlusion of the mid PDA. Successful PCI to the RCA with PTCA of the totally occluded mid distal PDA with restoration of TIMI-3 flow and diffusely narrowed 80% small caliber vessel stenting to the apex.  Successful PCI to the segmental 75 and 85% mid RCA stenoses with ultimate  insertion of a 3.5 x 38 mm Onyx frontier stent  postdilated to 3.78 mm with the stenoses being reduced to 0%. Difficult procedure secondary to aorta takeoff and posterior takeoff of the RCA. LVEDP 23 mmHg RECOMMENDATION: DAPT with aspirin/Brilinta for minimum of 1 year.  Medical therapy for concomitant CAD.  Will try to rechallenge statin, if intolerant consider PCSK9 inhibition.  2D echo Doppler study.  Guideline directed medical therapy for HFrEF.   Disposition   Pt is being discharged home today in good condition.  Follow-up Plans & Appointments     Follow-up Information     Yates Decamp, MD Follow up on 03/04/2023.   Specialty: Cardiology Why: at 11:40am for your follow up appt with cardiology Contact information: [address] [place] [address] [phone]                Discharge Instructions     AMB Referral to Advanced Lipid Disorders Clinic   Complete by: As directed    Internal Lipid Clinic Referral Scheduling  Internal lipid clinic referrals are providers within [facility], who wish to refer established patients for routine management (help in starting PCSK9 inhibitor therapy) or advanced therapies.  Internal MD referral criteria:              1. All patients with LDL>190 mg/dL  2. All patients with Triglycerides >500 mg/dL  3. Patients with suspected or confirmed heterozygous familial hyperlipidemia (HeFH) or homozygous familial hyperlipidemia (HoFH)  4. Patients with family history of suspicious for genetic dyslipidemia desiring genetic testing  5. Patients refractory to standard guideline based therapy  6. Patients with statin intolerance (failed 2 statins, one of which must be a high potency statin)  7. Patients who the provider desires to be seen by MD   Internal PharmD referral criteria:   1. Follow-up patients for medication management  2. Follow-up for compliance monitoring  3. Patients for drug education  4. Patients with statin  intolerance  5. PCSK9 inhibitor education and prior authorization approvals  6. Patients with triglycerides <500 mg/dL  External Lipid Clinic Referral  External lipid clinic referrals are for providers outside of [facility], considered new clinic patients - automatically routed to MD schedule   Amb Referral to Cardiac Rehabilitation   Complete by: As directed    Diagnosis:  Coronary Stents STEMI PTCA     After initial evaluation and assessments completed: Virtual Based Care may be provided alone or in conjunction with Phase 2 Cardiac Rehab based on patient barriers.: Yes   Intensive Cardiac Rehabilitation (ICR) [facility] location only OR Traditional Cardiac Rehabilitation (TCR) *If criteria for ICR are not met will enroll in TCR [facility] only): Yes   Call MD for:  difficulty breathing, headache or visual disturbances   Complete by: As directed    Call MD for:  persistant dizziness or light-headedness   Complete by: As directed    Call MD for:  redness, tenderness, or signs of infection (pain, swelling, redness, odor or green/yellow discharge around incision site)   Complete by: As directed    Diet - low sodium heart healthy   Complete by: As directed    Discharge instructions   Complete by: As directed    Radial Site Care Refer to this sheet in the next few weeks. These instructions provide you with information on caring for yourself after your procedure. Your caregiver may also give you more specific instructions. Your treatment has been planned according to current medical practices, but problems sometimes occur. Call your caregiver if you have any  problems or questions after your procedure. HOME CARE INSTRUCTIONS You may shower the day after the procedure. Remove the bandage (dressing) and gently wash the site with plain soap and water. Gently pat the site dry.  Do not apply powder or lotion to the site.  Do not submerge the affected site in water for 3 to 5 days.  Inspect the site at  least twice daily.  Do not flex or bend the affected arm for 24 hours.  No lifting over 5 pounds (2.3 kg) for 5 days after your procedure.  Do not drive home if you are discharged the same day of the procedure. Have someone else drive you.  You may drive 24 hours after the procedure unless otherwise instructed by your caregiver.  What to expect: Any bruising will usually fade within 1 to 2 weeks.  Blood that collects in the tissue (hematoma) may be painful to the touch. It should usually decrease in size and tenderness within 1 to 2 weeks.  SEEK IMMEDIATE MEDICAL CARE IF: You have unusual pain at the radial site.  You have redness, warmth, swelling, or pain at the radial site.  You have drainage (other than a small amount of blood on the dressing).  You have chills.  You have a fever or persistent symptoms for more than 72 hours.  You have a fever and your symptoms suddenly get worse.  Your arm becomes pale, cool, tingly, or numb.  You have heavy bleeding from the site. Hold pressure on the site.   Groin Site Care Refer to this sheet in the next few weeks. These instructions provide you with information on caring for yourself after your procedure. Your caregiver may also give you more specific instructions. Your treatment has been planned according to current medical practices, but problems sometimes occur. Call your caregiver if you have any problems or questions after your procedure. HOME CARE INSTRUCTIONS You may shower 24 hours after the procedure. Remove the bandage (dressing) and gently wash the site with plain soap and water. Gently pat the site dry.  Do not apply powder or lotion to the site.  Do not sit in a bathtub, swimming pool, or whirlpool for 5 to 7 days.  No bending, squatting, or lifting anything over 10 pounds (4.5 kg) as directed by your caregiver.  Inspect the site at least twice daily.  Do not drive home if you are discharged the same day of the procedure. Have someone  else drive you.  You may drive 24 hours after the procedure unless otherwise instructed by your caregiver.  What to expect: Any bruising will usually fade within 1 to 2 weeks.  Blood that collects in the tissue (hematoma) may be painful to the touch. It should usually decrease in size and tenderness within 1 to 2 weeks.  SEEK IMMEDIATE MEDICAL CARE IF: You have unusual pain at the groin site or down the affected leg.  You have redness, warmth, swelling, or pain at the groin site.  You have drainage (other than a small amount of blood on the dressing).  You have chills.  You have a fever or persistent symptoms for more than 72 hours.  You have a fever and your symptoms suddenly get worse.  Your leg becomes pale, cool, tingly, or numb.  You have heavy bleeding from the site. Hold pressure on the site. Marland Kitchen  PLEASE DO NOT MISS ANY DOSES OF YOUR BRILINTA!!!!! Also keep a log of you blood pressures and bring back to  your follow up appt. Please call the office with any questions.   Patients taking blood thinners should generally stay away from medicines like ibuprofen, Advil, Motrin, naproxen, and Aleve due to risk of stomach bleeding. You may take Tylenol as directed or talk to your primary doctor about alternatives.  Some studies suggest Prilosec/Omeprazole interacts with Plavix. We changed your Prilosec/Omeprazole to the equivalent dose of Protonix for less chance of interaction.  PLEASE ENSURE THAT YOU DO NOT RUN OUT OF YOUR BRILINTA. This medication is very important to remain on for at least one year. IF you have issues obtaining this medication due to cost please CALL the office 3-5 business days prior to running out in order to prevent missing doses of this medication.   Increase activity slowly   Complete by: As directed         Discharge Medications   Allergies as of 02/25/2023       Reactions   Ace Inhibitors    Crestor [rosuvastatin]    Norvasc [amlodipine Besylate]    Carac  [fluorouracil] Rash   Lipitor [atorvastatin] Other (See Comments)   Stated it "tore his shoulders up" with pain        Medication List     TAKE these medications    albuterol 108 (90 Base) MCG/ACT inhaler Commonly known as: VENTOLIN HFA Inhale 2 puffs into the lungs every 6 (six) hours as needed for wheezing or shortness of breath.   amLODipine 5 MG tablet Commonly known as: NORVASC Take 5 mg by mouth daily.   aspirin 81 MG tablet Take 81 mg by mouth daily.   diclofenac sodium 1 % Gel Commonly known as: VOLTAREN Apply 2-4 g topically 4 (four) times daily. What changed:  when to take this reasons to take this   ezetimibe 10 MG tablet Commonly known as: ZETIA Take 1 tablet (10 mg total) by mouth daily. Start taking on: February 26, 2023   hydrOXYzine 10 MG tablet Commonly known as: ATARAX Take 1 tablet (10 mg total) by mouth 3 (three) times daily as needed.   losartan 50 MG tablet Commonly known as: COZAAR Take 50 mg by mouth daily.   metoprolol succinate 25 MG 24 hr tablet Commonly known as: TOPROL-XL Take 25 mg by mouth daily.   nitroGLYCERIN 0.4 MG SL tablet Commonly known as: NITROSTAT Place 1 tablet (0.4 mg total) under the tongue every 5 (five) minutes as needed for chest pain.   omeprazole 40 MG capsule Commonly known as: PRILOSEC Take 40 mg by mouth daily.   sildenafil 100 MG tablet Commonly known as: VIAGRA Take 100 mg by mouth daily as needed for erectile dysfunction.   simvastatin 40 MG tablet Commonly known as: ZOCOR Take 40 mg by mouth daily.   temazepam 15 MG capsule Commonly known as: RESTORIL Take 15 mg by mouth at bedtime as needed for sleep.   terazosin 5 MG capsule Commonly known as: HYTRIN Take 5 mg by mouth at bedtime.   ticagrelor 90 MG Tabs tablet Commonly known as: BRILINTA Take 1 tablet (90 mg total) by mouth 2 (two) times daily.   triamcinolone ointment 0.5 % Commonly known as: KENALOG Apply 1 application topically  2 (two) times daily.   zaleplon 10 MG capsule Commonly known as: SONATA Take 10 mg by mouth at bedtime as needed for sleep.           Outstanding Labs/Studies   Outpatient Echo  Duration of Discharge Encounter   Greater than 30  minutes including physician time.  Signed, Laverda Page, NP 02/25/2023, 12:14 PM  ATTENDING ATTESTATION:  After conducting a review of all available clinical information with the care team, interviewing the patient, and performing a physical exam, I agree with the findings and plan described in this note.   GEN: No acute distress.   HEENT:  MMM, no JVD, no scleral icterus Cardiac: RRR, no murmurs, rubs, or gallops.  Respiratory: Clear to auscultation bilaterally. GI: Soft, nontender, non-distended  MS: No edema; No deformity. Neuro:  Nonfocal  Vasc:  +2 radial pulses  Patient doing well after uncomplicated treatment for inferior STEMI with PTCA of distal PDA and stenting of mid right coronary artery.  Continue dual antiplatelet therapy for 1 year.  Continue Toprol-XL 25 mg daily and resume losartan 50 mg daily.  Patient is currently on simvastatin 40 mg due to being intolerant of multiple statins in the past.  He is also on Zetia.  Will consider outpatient PCSK9 administration.  Discharge today with close hospital follow-up.  Alverda Skeans, MD Pager [phone]

## 2023-02-25 NOTE — Telephone Encounter (Signed)
   Transition of Care Follow-up Phone Call Request    Patient Name: Raymond Stanley Date of Birth: [birth year] Date of Encounter: 02/25/2023  Primary Care Provider:  Marden Noble, MD Primary Cardiologist:  None  [name] has been scheduled for a transition of care follow up appointment with a HeartCare provider:  Dr. Jacinto Halim 10/4  Please reach out to [name] within 48 hours of discharge to confirm appointment and review transition of care protocol questionnaire. Anticipated discharge date: 9/27  Laverda Page, NP  02/25/2023, 12:11 PM

## 2023-02-25 NOTE — Plan of Care (Signed)

## 2023-02-25 NOTE — TOC CM/SW Note (Signed)
Transition of Care [facility]) - Inpatient Brief Assessment   Patient Details  Name: Raymond Stanley MRN: [id number] Date of Birth: 1946/04/29  Transition of Care [facility]) CM/SW Contact:    Gala Lewandowsky, RN Phone Number: 02/25/2023, 10:51 AM   Clinical Narrative: Patient presented for [facility]- Plan for home on Brilinta. Benefits check submitted for cost and co pay is $47.00. No further needs identified at this time.   Transition of Care Asessment: Insurance and Status: Insurance coverage has been reviewed Patient has primary care physician: Yes  Prior/Current Home Services: No current home services Social Determinants of Health Reivew: SDOH reviewed no interventions necessary Readmission risk has been reviewed: Yes Transition of care needs: no transition of care needs at this time

## 2023-02-25 NOTE — Progress Notes (Signed)
CARDIAC REHAB PHASE I   PRE:  Rate/Rhythm: 80 NSR  BP:  Sitting: Not taken      SaO2: 98 RA  MODE:  Ambulation: 470 ft   AD:   no AD  POST:  Rate/Rhythm: 100 ST   BP:  Sitting: 160/100      SaO2: 98 RA  Pt was received from bed and agreeable to ambulation in hallway, Pt denies having CP and SOB during ambulation.   Pt was educated on stent card, stent location, Antiplatelet and ASA use, wt restrictions, no baths/daily wash-ups, s/s of infection, ex guidelines, s/s to stop exercising, NTG use and calling 911, heart healthy diet, risk factors and CRPII. Pt received MI book and materials on exercise, diet, and CRPII. Will refer to [facility].    Faustino Congress  ACSM-CEP 8:46 AM 02/25/2023    Service time is from 0815 to 0846.

## 2023-02-28 ENCOUNTER — Encounter (HOSPITAL_COMMUNITY): Payer: Self-pay

## 2023-02-28 ENCOUNTER — Telehealth (HOSPITAL_COMMUNITY): Payer: Self-pay

## 2023-02-28 NOTE — Telephone Encounter (Signed)
Attempted to call pt in regards to Cardiac rehab. LM on VM   Mailed letter

## 2023-02-28 NOTE — Telephone Encounter (Signed)
Referral recv'd and verified for MD signature. Follow up appointment is on 10/4@11 :40. Insurance benefits and eligibility TBD.

## 2023-03-01 NOTE — Telephone Encounter (Signed)
Transition Care Management Unsuccessful Follow-up Telephone Call  Date of discharge and from where:  Raymond Stanley 02/25/23  Attempts:  1st Attempt  Reason for unsuccessful TCM follow-up call:  Left voice message

## 2023-03-04 ENCOUNTER — Encounter: Payer: Self-pay | Admitting: Cardiology

## 2023-03-04 ENCOUNTER — Ambulatory Visit: Payer: Medicare Other | Attending: Cardiology | Admitting: Cardiology

## 2023-03-04 VITALS — BP 150/90 | HR 66 | Resp 16 | Ht 69.0 in | Wt 187.6 lb

## 2023-03-04 DIAGNOSIS — I25118 Atherosclerotic heart disease of native coronary artery with other forms of angina pectoris: Secondary | ICD-10-CM

## 2023-03-04 DIAGNOSIS — I10 Essential (primary) hypertension: Secondary | ICD-10-CM | POA: Diagnosis not present

## 2023-03-04 DIAGNOSIS — I213 ST elevation (STEMI) myocardial infarction of unspecified site: Secondary | ICD-10-CM | POA: Diagnosis not present

## 2023-03-04 DIAGNOSIS — E7800 Pure hypercholesterolemia, unspecified: Secondary | ICD-10-CM

## 2023-03-04 DIAGNOSIS — R0609 Other forms of dyspnea: Secondary | ICD-10-CM

## 2023-03-04 MED ORDER — AMLODIPINE BESYLATE 10 MG PO TABS: 10.0000 mg | ORAL_TABLET | Freq: Every day | ORAL | 3 refills | Status: AC

## 2023-03-04 MED ORDER — LOSARTAN POTASSIUM-HCTZ 100-12.5 MG PO TABS: 1.0000 | ORAL_TABLET | Freq: Every morning | ORAL | 3 refills | Status: DC

## 2023-03-04 MED ORDER — METOPROLOL SUCCINATE ER 25 MG PO TB24: 25.0000 mg | ORAL_TABLET | Freq: Every day | ORAL | 3 refills | Status: DC

## 2023-03-04 MED ORDER — EZETIMIBE 10 MG PO TABS: 10.0000 mg | ORAL_TABLET | Freq: Every day | ORAL | 3 refills | Status: DC

## 2023-03-04 MED ORDER — TICAGRELOR 90 MG PO TABS: 90.0000 mg | ORAL_TABLET | Freq: Two times a day (BID) | ORAL | 3 refills | Status: DC

## 2023-03-04 MED ORDER — SIMVASTATIN 40 MG PO TABS: 40.0000 mg | ORAL_TABLET | Freq: Every day | ORAL | 3 refills | Status: DC

## 2023-03-04 NOTE — Progress Notes (Unsigned)
Cardiology Office Note:  .   Date:  03/04/2023  ID:  Raymond Stanley, DOB 1945/09/19, MRN [id number] PCP: Raymond Aspen, MD  [facility] Providers Cardiologist:  Raymond Decamp, MD { Click to update primary MD,subspecialty MD or APP then REFRESH:1}   History of Present Illness: .   Raymond Stanley is a 77 y.o. Caucasian male patient with hypertension, hypercholesterolemia, hypovitaminosis D, erectile dysfunction who is a non-smoker, presented to hospital on 02/23/2023 with acute inferior STEMI, discharged 2 days later on 02/25/2023, underwent successful revascularization of the occluded right coronary artery with implantation of a 3.5 x 38 mm Onyx frontier DES.  He has moderate disease in the LAD and mild disease in the circumflex.  This is posthospital follow-up.  Discussed the use of AI scribe software for clinical note transcription with the patient, who gave verbal consent to proceed.  History of Present Illness   Raymond Stanley, a patient with a recent history of myocardial infarction, presents for a follow-up visit. He reports feeling generally better since the heart attack, but notes persistent shortness of breath, a symptom he has experienced for several years. He denies any recurrence of chest pain since the heart attack. He also mentions feeling a bit off balance since the event, which has affected his mobility and confidence in performing physical activities. Despite these issues, Raymond Stanley has been trying to stay active, including mowing his lawn and walking to his mailbox. He has a history of intolerance to certain statins, currently on simvastatin for cholesterol management. He has not used any S/L NTG.      Review of Systems  Cardiovascular:  Positive for dyspnea on exertion. Negative for chest pain and leg swelling.    Risk Assessment/Calculations:     HYPERTENSION CONTROL Vitals:   03/04/23 1138 03/04/23 1247  BP: (!) 142/92 (!) 150/90    The patient's blood pressure is  elevated above target today. {Click here if intervention needs to be changed Refresh Note :1}  In order to address the patient's elevated BP: A new medication was prescribed today.           Lab Results  Component Value Date   CHOL 161 02/23/2023   HDL 36 (L) 02/23/2023   LDLCALC 104 (H) 02/23/2023   TRIG 104 02/23/2023   CHOLHDL 4.5 02/23/2023   Lab Results  Component Value Date   NA 135 02/24/2023   K 3.9 02/24/2023   CO2 24 02/24/2023   GLUCOSE 108 (H) 02/24/2023   BUN 14 02/24/2023   CREATININE 1.20 02/24/2023   CALCIUM 8.8 (L) 02/24/2023   GFRNONAA >60 02/24/2023   Lab Results  Component Value Date   WBC 7.9 02/24/2023   HGB 13.0 02/24/2023   HCT 39.7 02/24/2023   MCV 91.1 02/24/2023   PLT 151 02/24/2023     External Labs:  TSH 3.930, normal on 07/15/2022.  A1c 6.0.  Physical Exam:   VS:  BP (!) 150/90   Pulse 66   Resp 16   Ht 5\' 9"  (1.753 m)   Wt 187 lb 9.6 oz (85.1 kg)   SpO2 97%   BMI 27.70 kg/m    Wt Readings from Last 3 Encounters:  03/04/23 187 lb 9.6 oz (85.1 kg)  02/23/23 190 lb (86.2 kg)  07/22/21 187 lb (84.8 kg)     Physical Exam Neck:     Vascular: No carotid bruit or JVD.  Cardiovascular:     Rate and Rhythm: Normal rate and  regular rhythm.     Pulses: Normal pulses and intact distal pulses.     Heart sounds: Murmur heard.     Early diastolic murmur is present with a grade of 2/4 at the upper left sternal border.     No gallop.  Pulmonary:     Effort: Pulmonary effort is normal.     Breath sounds: Normal breath sounds.  Abdominal:     General: Bowel sounds are normal.     Palpations: Abdomen is soft.  Musculoskeletal:     Right lower leg: No edema.     Left lower leg: No edema.     Studies Reviewed: Marland Kitchen   EKG Interpretation Date/Time:  Friday March 04 2023 11:35:54 EDT Ventricular Rate:  74 PR Interval:  186 QRS Duration:  100 QT Interval:  392 QTC Calculation: 435 R Axis:   -39  Text Interpretation: EKG  03/04/2023: Normal sinus rhythm at rate of 74 bpm, left anterior fascicular block.  Poor R progression, probably normal variant.  LVH by voltage. No evidence of ischemia.  Compared to 02/24/2023, no significant change. Confirmed by Delrae Rend [phone]) on 03/04/2023 12:16:58 PM    Cath: 02/25/2023  Acute ST segment elevation myocardial infarction secondary to total mid distal PDA occlusion in a dominant RCA with high-grade mid stenoses.   Severe multivessel coronary calcification with 50 and 60% proximal LAD stenoses, irregularity of the left circumflex vessel with 30 to 40% stenoses.   Dominant RCA with posterior takeoff with 20% ostial narrowing followed by diffuse 75% to mid stenoses with 85% focal eccentric stenosis before the acute margin, 30 and 40% distal stenoses with total occlusion of the mid PDA.   Successful PCI to the RCA with PTCA of the totally occluded mid distal PDA with restoration of TIMI-3 flow and diffusely narrowed 80% small caliber vessel stenting to the apex.  Successful PCI to the segmental 75 and 85% mid RCA stenoses with ultimate insertion of a 3.5 x 38 mm Onyx frontier stent postdilated to 3.78 mm with the stenoses being reduced to 0%.   Difficult procedure secondary to aorta takeoff and posterior takeoff of the RCA.   LVEDP 23 mmHg   RECOMMENDATION: DAPT with aspirin/Brilinta for minimum of 1 year.  Medical therapy for concomitant CAD.  Will try to rechallenge statin, if intolerant consider PCSK9 inhibition.  2D echo Doppler study.  Guideline directed medical therapy for HFrEF.    Diagnostic Dominance: Right  Intervention       ASSESSMENT AND PLAN: .      ICD-10-CM   1. Coronary artery disease of native artery of native heart with stable angina pectoris (HCC)  I25.118 EKG 12-Lead    2. Hypercholesteremia  E78.00     3. Dyspnea on exertion  R06.09     4. Primary hypertension  I10     5. ST elevation myocardial infarction (STEMI), unspecified artery  (HCC)  I21.3       Assessment and Plan 1. Coronary artery disease of native artery of native heart with stable angina pectoris [facility]) Patient with recent inferior STEMI, presently doing well and still continues to have dyspnea and has not had any recurrence of angina pectoris.  He has moderate coronary disease involving the circumflex and LAD.  He will need echocardiogram to reevaluate his LV systolic function since his recent myocardial infarction.  In view of his dyspnea, recent myocardial infarction, I have recommended that he enroll himself in cardiac rehab which she was initially reluctant to.  Family agrees, daughter present. He will continue DAPT for 1 year.  He is tolerating his well.  Will obtain CBC and a CMP in 2 to 3 weeks. - EKG 12-Lead  2. Hypercholesteremia He could not tolerate Crestor in the past, he has not tried Lipitor, He is presently on simvastatin 40 mg which he has tolerated and prefers to continue the same, however Zetia was added on while he was in the hospital.  I will obtain lipid profile testing in 2 to 3 weeks.  3. Dyspnea on exertion Patient continues to have marked dyspnea on exertion that is chronic and ongoing for several years.  He is never a smoker.  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  I will make a referral for evaluation of his marked dyspnea as he is extremely symptomatic and likes to be very active.  No clinical evidence of heart failure.  4. Primary hypertension Blood pressure is elevated.  I have increased the dose of amlodipine from 5 mg to 10 mg daily, will also discontinue losartan 50 mg and switch him to losartan HCT 100/12.5 mg in the morning, a CMP will be obtained in 2 to 3 weeks after making changes.  I would like to see him back in 2 months for follow-up.  Other orders - atorvastatin (LIPITOR) 40 MG tablet; Take 40 mg by mouth daily. - Cyanocobalamin (VITAMIN B12) 1000  MCG TBCR; Take 1 capsule by mouth daily. - Cholecalciferol (VITAMIN D3) 50 MCG (2000 UT) capsule; Take 2,000 Units by mouth daily. - cyclobenzaprine (FLEXERIL) 10 MG tablet; Take 10 mg by mouth 3 (three) times daily as needed for muscle spasms. - co-enzyme Q-10 30 MG capsule; Take 30 mg by mouth 3 (three) times daily.  This was a 55-minute office visit encounter and review of his medical records, extensive review of his external records, including review of his echocardiogram which had revealed mild decrease in LVEF at [place] [facility], coordination of care.  {The patient has an active order for outpatient cardiac rehabilitation.   Please indicate if the patient is ready to start. Do NOT delete this.  It will auto delete.  Refresh note, then sign.              Click here to document readiness and see contraindications.  :1}  Cardiac Rehabilitation Eligibility Assessment  The patient is ready to start cardiac rehabilitation from a cardiac standpoint.      Signed,  Raymond Decamp, MD, [facility] 03/04/2023, 12:49 PM

## 2023-03-04 NOTE — Patient Instructions (Signed)
Medication Instructions:  Your physician has recommended you make the following change in your medication:   1) STOP losartan 2) START losartan-hydrochlorothiazide 100-12.5 mg daily (in the morning) 3) INCREASE amlodipine to 10 mg daily  *If you need a refill on your cardiac medications before your next appointment, please call your pharmacy*  Lab Work: In 3 weeks: fasting Lipid panel, BMP, CBC If you have labs (blood work) drawn today and your tests are completely normal, you will receive your results only by: MyChart Message (if you have MyChart) OR A paper copy in the mail If you have any lab test that is abnormal or we need to change your treatment, we will call you to review the results.  Testing/Procedures: Your physician has requested that you have an echocardiogram. Echocardiography is a painless test that uses sound waves to create images of your heart. It provides your doctor with information about the size and shape of your heart and how well your heart's chambers and valves are working. This procedure takes approximately one hour. There are no restrictions for this procedure. Please do NOT wear cologne, perfume, aftershave, or lotions (deodorant is allowed). Please arrive 15 minutes prior to your appointment time.  Follow-Up: At [facility], you and your health needs are our priority.  As part of our continuing mission to provide you with exceptional heart care, we have created designated Provider Care Teams.  These Care Teams include your primary Cardiologist (physician) and Advanced Practice Providers (APPs -  Physician Assistants and Nurse Practitioners) who all work together to provide you with the care you need, when you need it.  We recommend signing up for the patient portal called "MyChart".  Sign up information is provided on this After Visit Summary.  MyChart is used to connect with patients for Virtual Visits (Telemedicine).  Patients are able to view lab/test  results, encounter notes, upcoming appointments, etc.  Non-urgent messages can be sent to your provider as well.   To learn more about what you can do with MyChart, go to [email or web address].    Your next appointment:   2 month(s) (can add to quarter-day clinic per Dr. Jacinto Halim)  The format for your next appointment:   In Person  Provider:   Yates Decamp, MD {  Other Instructions You have been referred to [facility]. A scheduler will call you to schedule an appointment to see one of their pulmonologists to evaluate your shortness of breath.  You have been referred for Cardiac Rehab with [facility]. You will be contacted to schedule a time to start, there may be a wait of 2-4 weeks.

## 2023-03-09 ENCOUNTER — Telehealth (HOSPITAL_COMMUNITY): Payer: Self-pay

## 2023-03-09 NOTE — Telephone Encounter (Signed)
Pt called about his cardiac rehab referral. He said " for an old man he is pretty active and he is even cutting holes in the floor." He is going to call back up here by Friday 10/11 and let us know if he is interested in cardiac rehab or not. I did let him know that if he does decide not to proceed with cardiac rehab then he will have up to a year to use the referral from his heart event.

## 2023-03-11 ENCOUNTER — Encounter: Payer: Self-pay | Admitting: Cardiology

## 2023-03-16 ENCOUNTER — Telehealth (HOSPITAL_COMMUNITY): Payer: Self-pay

## 2023-03-16 ENCOUNTER — Encounter (HOSPITAL_COMMUNITY): Payer: Self-pay

## 2023-03-16 NOTE — Telephone Encounter (Signed)
Attempted to call patient in regards to Cardiac Rehab - LM on VM Mailed letter 

## 2023-03-25 ENCOUNTER — Ambulatory Visit (HOSPITAL_COMMUNITY): Payer: Medicare Other | Attending: Cardiology

## 2023-03-25 ENCOUNTER — Ambulatory Visit (HOSPITAL_BASED_OUTPATIENT_CLINIC_OR_DEPARTMENT_OTHER): Payer: Medicare Other

## 2023-03-25 DIAGNOSIS — E7800 Pure hypercholesterolemia, unspecified: Secondary | ICD-10-CM | POA: Diagnosis not present

## 2023-03-25 DIAGNOSIS — R0609 Other forms of dyspnea: Secondary | ICD-10-CM

## 2023-03-25 DIAGNOSIS — I10 Essential (primary) hypertension: Secondary | ICD-10-CM | POA: Insufficient documentation

## 2023-03-25 DIAGNOSIS — I25118 Atherosclerotic heart disease of native coronary artery with other forms of angina pectoris: Secondary | ICD-10-CM | POA: Diagnosis not present

## 2023-03-25 LAB — BASIC METABOLIC PANEL
BUN/Creatinine Ratio: 12 (ref 10–24)
BUN: 18 mg/dL (ref 8–27)
CO2: 23 mmol/L (ref 20–29)
Calcium: 9.8 mg/dL (ref 8.6–10.2)
Chloride: 105 mmol/L (ref 96–106)
Creatinine, Ser: 1.47 mg/dL — ABNORMAL HIGH (ref 0.76–1.27)
Glucose: 100 mg/dL — ABNORMAL HIGH (ref 70–99)
Potassium: 3.9 mmol/L (ref 3.5–5.2)
Sodium: 141 mmol/L (ref 134–144)
eGFR: 49 mL/min/{1.73_m2} — ABNORMAL LOW (ref >59)

## 2023-03-25 LAB — CBC
Hematocrit: 41.7 % (ref 37.5–51.0)
Hemoglobin: 13.3 g/dL (ref 13.0–17.7)
MCH: 29.8 pg (ref 26.6–33.0)
MCHC: 31.9 g/dL (ref 31.5–35.7)
MCV: 94 fL (ref 79–97)
Platelets: 156 10*3/uL (ref 150–450)
RBC: 4.46 x10E6/uL (ref 4.14–5.80)
RDW: 13.2 % (ref 11.6–15.4)
WBC: 5.8 10*3/uL (ref 3.4–10.8)

## 2023-03-25 LAB — ECHOCARDIOGRAM COMPLETE
AR max vel: 1.88 cm2
AV Area VTI: 2.06 cm2
AV Area mean vel: 1.81 cm2
AV Mean grad: 6 mm[Hg]
AV Peak grad: 12.4 mm[Hg]
Ao pk vel: 1.76 m/s
Area-P 1/2: 3.29 cm2
S' Lateral: 3.2 cm

## 2023-03-25 LAB — LIPID PANEL
Chol/HDL Ratio: 3 ratio (ref 0.0–5.0)
Cholesterol, Total: 125 mg/dL (ref 100–199)
HDL: 42 mg/dL (ref >39)
LDL Chol Calc (NIH): 65 mg/dL (ref 0–99)
Triglycerides: 95 mg/dL (ref 0–149)
VLDL Cholesterol Cal: 18 mg/dL (ref 5–40)

## 2023-03-25 NOTE — Progress Notes (Signed)
Please cut Losartan HCT from 100/12.5 mg to 1/2 tab daily and will recheck blood work again on your visit with me

## 2023-03-25 NOTE — Progress Notes (Signed)
Echocardiogram 03/24/2023:   1. Left ventricular ejection fraction, by estimation, is 45 to 50%. The left ventricle has mildly decreased function. The left ventricle demonstrates regional wall motion abnormalities with inferoseptal and inferior hypokinesis. Left ventricular  diastolic parameters are consistent with Grade I diastolic dysfunction (impaired relaxation).  2. Right ventricular systolic function is normal. The right ventricular size is mildly enlarged. There is normal pulmonary artery systolic pressure. The estimated right ventricular systolic pressure is 27.6 mmHg.  3. Left atrial size was mild to moderately dilated.  4. Right atrial size was mildly dilated.  5. The mitral valve is normal in structure. Trivial mitral valve regurgitation. No evidence of mitral stenosis.  6. The aortic valve is tricuspid. There is moderate calcification of the aortic valve. Aortic valve regurgitation is not visualized. Aortic valve sclerosis/calcification is present, without any evidence of aortic stenosis.  7. The inferior vena cava is normal in size with greater than 50% respiratory variability, suggesting right atrial pressure of 3 mmHg.

## 2023-03-29 ENCOUNTER — Other Ambulatory Visit: Payer: Self-pay | Admitting: *Deleted

## 2023-03-29 DIAGNOSIS — H02105 Unspecified ectropion of left lower eyelid: Secondary | ICD-10-CM | POA: Diagnosis not present

## 2023-03-29 DIAGNOSIS — H02102 Unspecified ectropion of right lower eyelid: Secondary | ICD-10-CM | POA: Diagnosis not present

## 2023-03-29 DIAGNOSIS — H5203 Hypermetropia, bilateral: Secondary | ICD-10-CM | POA: Diagnosis not present

## 2023-03-29 MED ORDER — LOSARTAN POTASSIUM-HCTZ 100-12.5 MG PO TABS: 0.5000 | ORAL_TABLET | Freq: Every morning | ORAL | Status: DC

## 2023-04-11 ENCOUNTER — Telehealth: Payer: Self-pay | Admitting: Cardiology

## 2023-04-11 NOTE — Telephone Encounter (Signed)
   Pre-operative Risk Assessment    Patient Name: Raymond Stanley  DOB: 1945-06-07 MRN: [id number]     Request for Surgical Clearance    Procedure:   Dental Exam   Date of Surgery:  Clearance 04/11/23                                 Surgeon:  N/A Surgeon's Group or Practice Name:  N/A Phone number:  N/A Fax number:  N/A   Type of Clearance Requested:   - Medical    Type of Anesthesia:  None    Additional requests/questions:  Does this patient need antibiotics?  Signed, Brooks Sailors   04/11/2023, 11:33 AM     Patient's daughter who is a periodontist is calling stating she is going to see him today due to pain in his mouth. She is wanting to know if he needs premed for just an exam due to his recent stent.   She states the general dentist is seeing him tomorrow, but they are unsure of what would be done past an exam until that appointment.   Please advise.

## 2023-04-11 NOTE — Telephone Encounter (Signed)
   Patient Name: Raymond Stanley  DOB: [birth year] MRN: [id number]  Primary Cardiologist: Yates Decamp, MD  Chart reviewed as part of pre-operative protocol coverage.   Simple dental extractions (i.e. 1-2 teeth) are considered low risk procedures per guidelines and generally do not require any specific cardiac clearance. It is also generally accepted that for simple extractions, filings and dental cleanings, or dental examinations, there is no need to interrupt blood thinner therapy.  SBE prophylaxis is not required for the patient from a cardiac standpoint.  I will route this recommendation to the requesting party via Epic fax function and remove from pre-op pool.  Please call with questions.  Joni Reining, NP 04/11/2023, 12:00 PM

## 2023-04-12 DIAGNOSIS — H02105 Unspecified ectropion of left lower eyelid: Secondary | ICD-10-CM | POA: Diagnosis not present

## 2023-04-12 DIAGNOSIS — H02102 Unspecified ectropion of right lower eyelid: Secondary | ICD-10-CM | POA: Diagnosis not present

## 2023-04-14 DIAGNOSIS — D485 Neoplasm of uncertain behavior of skin: Secondary | ICD-10-CM | POA: Diagnosis not present

## 2023-04-14 DIAGNOSIS — L570 Actinic keratosis: Secondary | ICD-10-CM | POA: Diagnosis not present

## 2023-04-14 DIAGNOSIS — C44722 Squamous cell carcinoma of skin of right lower limb, including hip: Secondary | ICD-10-CM | POA: Diagnosis not present

## 2023-04-14 DIAGNOSIS — L814 Other melanin hyperpigmentation: Secondary | ICD-10-CM | POA: Diagnosis not present

## 2023-04-14 DIAGNOSIS — L853 Xerosis cutis: Secondary | ICD-10-CM | POA: Diagnosis not present

## 2023-04-14 DIAGNOSIS — D0439 Carcinoma in situ of skin of other parts of face: Secondary | ICD-10-CM | POA: Diagnosis not present

## 2023-04-15 ENCOUNTER — Telehealth (HOSPITAL_COMMUNITY): Payer: Self-pay

## 2023-04-15 NOTE — Telephone Encounter (Signed)
No response from pt.  Closed referral  

## 2023-05-12 ENCOUNTER — Encounter: Payer: Self-pay | Admitting: Pulmonary Disease

## 2023-05-12 ENCOUNTER — Ambulatory Visit: Payer: Medicare Other | Admitting: Pulmonary Disease

## 2023-05-12 VITALS — BP 118/82 | HR 72 | Ht 69.0 in | Wt 192.0 lb

## 2023-05-12 DIAGNOSIS — R0602 Shortness of breath: Secondary | ICD-10-CM

## 2023-05-12 DIAGNOSIS — K219 Gastro-esophageal reflux disease without esophagitis: Secondary | ICD-10-CM

## 2023-05-12 DIAGNOSIS — K449 Diaphragmatic hernia without obstruction or gangrene: Secondary | ICD-10-CM | POA: Diagnosis not present

## 2023-05-12 MED ORDER — FAMOTIDINE 20 MG PO TABS
20.0000 mg | ORAL_TABLET | Freq: Every day | ORAL | 3 refills | Status: DC
Start: 2023-05-12 — End: 2023-05-20

## 2023-05-12 NOTE — Patient Instructions (Addendum)
Start famotidine 20mg  daily at bedtime for reflux  Continue omeprazole 40mg  daily for reflux  Recommend getting a wedge pillow to sleep on at night to reduce reflux symptoms  We will refer you to Dr. Cliffton Asters for evaluation of hiatal hernia  I will reach out to Dr. Jacinto Halim and determine if a cardiopulmonary exercise test would be necessary  We will check your oxygen when walking today  Follow up in 3 months

## 2023-05-12 NOTE — Progress Notes (Signed)
Synopsis: Referred in December 2024 for dyspnea by Yates Decamp, MD  Subjective:   PATIENT ID: Raymond Stanley GENDER: male DOB: 02-26-1946, MRN: [id number]  HPI  Chief Complaint  Patient presents with   Pulmonary Consult    Referred by Dr. Nadara Eaton. Pt c/o DOE over the past 5 years, esp worse over the past 2 years. He has hx of asbestos exp. He has persistent dry cough x 2 years, raspy voice.    Raymond Stanley is a 77 year old male, never smoker with history of hypertension, CAD, GERD and pulmonary emboli who is referred to pulmonary clinic for shortness of breath.   He was seen by Dr. Jacinto Halim 03/04/23, note reviewed, where he was noted to complain of dyspnea for several years. He reported exposure to asbestos in the past. He was evaluated by Dr. Marchelle Gearing of pulmonary in 2019. HRCT Chest did not indicate findings of ILD but did show air trapping. He had mild diffusion defect on PFTs in 2019. PFTs from 2023 at [facility] clinic are within normal limits. No ERV available for review. Most recent Echo 03/2023 shows LV EF 45-50% (has been 40% in past), Grade I diastolic dysfunction. Normal RV function and mildly enlarged RV. Dilated left and right atria.   The patient, with a history of hiatal hernia and recent heart attack, presents with a chief complaint of progressive shortness of breath over the past four to five years. Initially, the shortness of breath was only noticeable during physical exertion, such as working outside. However, it has progressively worsened to the point where even walking from the bedroom to the kitchen or up a flight of stairs causes significant breathlessness. The patient also reports increased frequency of acid reflux, despite taking omeprazole 40mg  daily, sometimes twice a day if anticipating a late or spicy meal. The patient sleeps with three pillows to alleviate reflux symptoms but still experiences episodes approximately once a week. The patient has seen multiple pulmonologists over  the past five years, with suggested causes for the symptoms ranging from acid reflux damage to the lungs to asbestosis. The patient believes the hiatal hernia is the primary cause of the shortness of breath.  Past Medical History:  Diagnosis Date   Actinic keratoses    BPH (benign prostatic hypertrophy)    ED (erectile dysfunction)    Esophageal reflux    Hearing loss    Other and unspecified hyperlipidemia    Premature atrial contractions    Pulmonary emboli (HCC)    Pulmonary emboli (HCC)    Skin cancer    Unspecified essential hypertension      Family History  Problem Relation Age of Onset   Hypertension Mother    CAD Mother    Hypertension Father    CAD Father    Heart disease Brother    Hypertension Brother    Alzheimer's disease Brother    Hypertension Maternal Grandmother    CAD Maternal Grandmother    Asthma Daughter      Social History   Socioeconomic History   Marital status: Married    Spouse name: Not on file   Number of children: Not on file   Years of education: Not on file   Highest education level: Not on file  Occupational History   Not on file  Tobacco Use   Smoking status: Never    Passive exposure: Never   Smokeless tobacco: Never  Vaping Use   Vaping status: Never Used  Substance and Sexual Activity  Alcohol use: Yes    Comment: occasional   Drug use: No   Sexual activity: Not on file  Other Topics Concern   Not on file  Social History Narrative   Not on file   Social Drivers of Health   Financial Resource Strain: Not on file  Food Insecurity: No Food Insecurity (02/23/2023)   Hunger Vital Sign    Worried About Running Out of Food in the Last Year: Never true    Ran Out of Food in the Last Year: Never true  Transportation Needs: No Transportation Needs (02/23/2023)   PRAPARE - Administrator, Civil Service (Medical): No    Lack of Transportation (Non-Medical): No  Physical Activity: Not on file  Stress: Not on file   Social Connections: Unknown (10/11/2021)   [facility]   Social Network    Social Network: Not on file  Intimate Partner Violence: Not At Risk (02/23/2023)   Humiliation, Afraid, Rape, and Kick questionnaire    Fear of Current or Ex-Partner: No    Emotionally Abused: No    Physically Abused: No    Sexually Abused: No     Allergies  Allergen Reactions   Ace Inhibitors    Crestor [Rosuvastatin]    Norvasc [Amlodipine Besylate]    Carac [Fluorouracil] Rash   Lipitor [Atorvastatin] Other (See Comments)    Stated it "tore his shoulders up" with pain     Outpatient Medications Prior to Visit  Medication Sig Dispense Refill   amLODipine (NORVASC) 10 MG tablet Take 1 tablet (10 mg total) by mouth daily. 90 tablet 3   aspirin 81 MG tablet Take 81 mg by mouth daily.     Cholecalciferol (VITAMIN D3) 50 MCG (2000 UT) capsule Take 2,000 Units by mouth daily.     co-enzyme Q-10 30 MG capsule Take 30 mg by mouth 3 (three) times daily.     Cyanocobalamin (VITAMIN B12) 1000 MCG TBCR Take 1 capsule by mouth daily.     cyclobenzaprine (FLEXERIL) 10 MG tablet Take 10 mg by mouth 3 (three) times daily as needed for muscle spasms.     diclofenac sodium (VOLTAREN) 1 % GEL Apply 2-4 g topically 4 (four) times daily. (Patient taking differently: Apply 2-4 g topically daily as needed (artheritis).) 15 Tube 4   ezetimibe (ZETIA) 10 MG tablet Take 1 tablet (10 mg total) by mouth daily. 90 tablet 3   losartan-hydrochlorothiazide (HYZAAR) 100-12.5 MG tablet Take 0.5 tablets by mouth in the morning.     metoprolol succinate (TOPROL-XL) 25 MG 24 hr tablet Take 1 tablet (25 mg total) by mouth daily. 90 tablet 3   nitroGLYCERIN (NITROSTAT) 0.4 MG SL tablet Place 1 tablet (0.4 mg total) under the tongue every 5 (five) minutes as needed for chest pain. 25 tablet 1   omeprazole (PRILOSEC) 40 MG capsule Take 40 mg by mouth daily.     simvastatin (ZOCOR) 40 MG tablet Take 1 tablet (40 mg  total) by mouth daily. 90 tablet 3   temazepam (RESTORIL) 15 MG capsule Take 15 mg by mouth at bedtime as needed for sleep.     terazosin (HYTRIN) 5 MG capsule Take 5 mg by mouth at bedtime.     ticagrelor (BRILINTA) 90 MG TABS tablet Take 1 tablet (90 mg total) by mouth 2 (two) times daily. 180 tablet 3   triamcinolone ointment (KENALOG) 0.5 % Apply 1 application topically 2 (two) times daily. 30 g 0  albuterol (VENTOLIN HFA) 108 (90 Base) MCG/ACT inhaler Inhale 2 puffs into the lungs every 6 (six) hours as needed for wheezing or shortness of breath. (Patient not taking: Reported on 05/12/2023) 8 g 2   No facility-administered medications prior to visit.    Review of Systems  Constitutional:  Negative for chills, fever, malaise/fatigue and weight loss.  HENT:  Negative for congestion, sinus pain and sore throat.   Eyes: Negative.   Respiratory:  Positive for shortness of breath. Negative for cough, hemoptysis, sputum production and wheezing.   Cardiovascular:  Negative for chest pain, palpitations, orthopnea, claudication and leg swelling.  Gastrointestinal:  Positive for heartburn. Negative for abdominal pain, nausea and vomiting.  Genitourinary: Negative.   Musculoskeletal:  Negative for joint pain and myalgias.  Skin:  Negative for rash.  Neurological:  Negative for weakness.  Endo/Heme/Allergies: Negative.   Psychiatric/Behavioral: Negative.     Objective:   Vitals:   05/12/23 0900  BP: 118/82  Pulse: 72  SpO2: 97%  Weight: 192 lb (87.1 kg)  Height: 5\' 9"  (1.753 m)   Physical Exam Constitutional:      General: He is not in acute distress.    Appearance: Normal appearance.  Eyes:     General: No scleral icterus.    Conjunctiva/sclera: Conjunctivae normal.  Cardiovascular:     Rate and Rhythm: Normal rate and regular rhythm.  Pulmonary:     Breath sounds: No wheezing, rhonchi or rales.  Musculoskeletal:     Right lower leg: No edema.     Left lower leg: No edema.   Skin:    General: Skin is warm and dry.  Neurological:     General: No focal deficit present.     CBC    Component Value Date/Time   WBC 5.8 03/25/2023 0912   WBC 7.9 02/24/2023 0252   RBC 4.46 03/25/2023 0912   RBC 4.36 02/24/2023 0252   HGB 13.3 03/25/2023 0912   HCT 41.7 03/25/2023 0912   PLT 156 03/25/2023 0912   MCV 94 03/25/2023 0912   MCH 29.8 03/25/2023 0912   MCH 29.8 02/24/2023 0252   MCHC 31.9 03/25/2023 0912   MCHC 32.7 02/24/2023 0252   RDW 13.2 03/25/2023 0912   LYMPHSABS 2.4 02/23/2023 1145   MONOABS 0.5 02/23/2023 1145   EOSABS 0.1 02/23/2023 1145   BASOSABS 0.0 02/23/2023 1145      Latest Ref Rng & Units 03/25/2023    9:12 AM 02/24/2023    2:52 AM 02/23/2023    7:45 PM  BMP  Glucose 70 - 99 mg/dL 130  865    BUN 8 - 27 mg/dL 18  14    Creatinine 7.84 - 1.27 mg/dL 6.96  2.95  2.84   BUN/Creat Ratio 10 - 24 12     Sodium 134 - 144 mmol/L 141  135    Potassium 3.5 - 5.2 mmol/L 3.9  3.9    Chloride 96 - 106 mmol/L 105  105    CO2 20 - 29 mmol/L 23  24    Calcium 8.6 - 10.2 mg/dL 9.8  8.8     Chest imaging: CT Chest 09/29/20 Mediastinum/Nodes: Thoracic inlet is within normal limits. No sizable hilar or mediastinal adenopathy is noted. The esophagus as visualized is within normal limits. A moderate-sized hiatal hernia is noted however.   Lungs/Pleura: Lungs are well aerated bilaterally. Mild right basilar atelectasis is seen. No focal confluent infiltrate is noted. No sizable parenchymal nodule is seen. No effusions  are seen.  PFT:    Latest Ref Rng & Units 02/15/2018   10:56 AM  PFT Results  FVC-Pre L 3.20   FVC-Predicted Pre % 76   FVC-Post L 3.06   FVC-Predicted Post % 73   Pre FEV1/FVC % % 76   Post FEV1/FCV % % 82   FEV1-Pre L 2.43   FEV1-Predicted Pre % 79   FEV1-Post L 2.50   DLCO uncorrected ml/min/mmHg 20.80   DLCO UNC% % 67   DLVA Predicted % 90   TLC L 6.08   TLC % Predicted % 89   RV % Predicted % 110    2023 [facility] SPIROMETRY: FVC was 3.22 liters, 83% of predicted FEV1 was 2.50, 86% of predicted FEV1 ratio was 77.61 FEF 25-75% liters per second was 102% of predicted  LUNG VOLUMES: TLC was 84% of predicted RV was 99% of predicted  DIFFUSION CAPACITY: DLCO was 97% of predicted DLCO/VA was 105% of predicted   Labs:  Path:  Echo 03/2023:  1. Left ventricular ejection fraction, by estimation, is 45 to 50%. The  left ventricle has mildly decreased function. The left ventricle  demonstrates regional wall motion abnormalities with inferoseptal and  inferior hypokinesis. Left ventricular  diastolic parameters are consistent with Grade I diastolic dysfunction  (impaired relaxation).   2. Right ventricular systolic function is normal. The right ventricular  size is mildly enlarged. There is normal pulmonary artery systolic  pressure. The estimated right ventricular systolic pressure is 27.6 mmHg.   3. Left atrial size was mild to moderately dilated.   4. Right atrial size was mildly dilated.   5. The mitral valve is normal in structure. Trivial mitral valve  regurgitation. No evidence of mitral stenosis.   6. The aortic valve is tricuspid. There is moderate calcification of the  aortic valve. Aortic valve regurgitation is not visualized. Aortic valve  sclerosis/calcification is present, without any evidence of aortic  stenosis.   7. The inferior vena cava is normal in size with greater than 50%  respiratory variability, suggesting right atrial pressure of 3 mmHg.   Heart Catheterization 01/2023: LVEDP 23 mmHg      Prox LAD to Mid LAD lesion is 50% stenosed.   Mid LAD lesion is 60% stenosed.   1st Sept lesion is 50% stenosed.   Ost Cx to Prox Cx lesion is 30% stenosed.   Mid Cx lesion is 40% stenosed.   3rd Mrg lesion is 40% stenosed.   RPDA-2 lesion is 100% stenosed.   Dist RCA lesion is 30% stenosed.   RPDA-1 lesion is 30% stenosed.   Mid RCA lesion is 85% stenosed.   Prox RCA to  Mid RCA lesion is 75% stenosed.   Ost RCA lesion is 20% stenosed.   A drug-eluting stent was successfully placed.   Post intervention, there is a 80% residual stenosis.   Post intervention, there is a 0% residual stenosis.   Post intervention, there is a 0% residual stenosis.   Recommend uninterrupted dual antiplatelet therapy with Aspirin 81mg  daily and Ticagrelor 90mg  twice daily for a minimum of 12 months (ACS-Class I recommendation). Assessment & Plan:   Shortness of breath  Hiatal hernia - Plan: Ambulatory referral to Cardiothoracic Surgery, CT Chest W Contrast  Gastroesophageal reflux disease without esophagitis - Plan: famotidine (PEPCID) 20 MG tablet  Discussion: Raymond Stanley is a 77 year old male, never smoker with history of hypertension, CAD, GERD and pulmonary emboli who is referred to pulmonary  clinic for shortness of breath.   Moderate to Large Hiatal Hernia Chronic dyspnea, possibly due to hernia compressing right atrium and affecting blood return to the heart. Chronic reflux symptoms despite taking Omeprazole 40mg  daily, sometimes twice daily. Patient has been advised against surgery due to age and risk. -Refer to Dr. Cliffton Asters for surgical consultation. -Check CT Chest with contrast with pulmonary vein protocol to evaluate for extrernal compression of pulmonary vein -Add Famotidine at bedtime to help with reflux symptoms. -Recommend use of a wedge pillow for sleep to reduce reflux symptoms.  Post Myocardial Infarction Diastolic Heart Failure Pulmonary Hypertension, LVEDP - due to increased pulmonary venous pressure Recent heart attack with stent placement. Mild reduction in heart function and impaired relaxation of the heart, possibly contributing to dyspnea.  -Continue current cardiac medications as prescribed by Dr. Jacinto Halim. -Consider cardiopulmonary exercise test to assess heart and lung function during activity.  Follow-up in 3 months and maintain  communication with Dr. Jacinto Halim and Dr. Cliffton Asters regarding patient's condition and potential surgical options.  65 minutes spent on this visit communicating with fellow specialists, record review, direct patient care and completion of orders/documentation.   Melody Comas, MD [facility] Office: [phone]   Current Outpatient Medications:    amLODipine (NORVASC) 10 MG tablet, Take 1 tablet (10 mg total) by mouth daily., Disp: 90 tablet, Rfl: 3   aspirin 81 MG tablet, Take 81 mg by mouth daily., Disp: , Rfl:    Cholecalciferol (VITAMIN D3) 50 MCG (2000 UT) capsule, Take 2,000 Units by mouth daily., Disp: , Rfl:    co-enzyme Q-10 30 MG capsule, Take 30 mg by mouth 3 (three) times daily., Disp: , Rfl:    Cyanocobalamin (VITAMIN B12) 1000 MCG TBCR, Take 1 capsule by mouth daily., Disp: , Rfl:    cyclobenzaprine (FLEXERIL) 10 MG tablet, Take 10 mg by mouth 3 (three) times daily as needed for muscle spasms., Disp: , Rfl:    diclofenac sodium (VOLTAREN) 1 % GEL, Apply 2-4 g topically 4 (four) times daily. (Patient taking differently: Apply 2-4 g topically daily as needed (artheritis).), Disp: 15 Tube, Rfl: 4   ezetimibe (ZETIA) 10 MG tablet, Take 1 tablet (10 mg total) by mouth daily., Disp: 90 tablet, Rfl: 3   famotidine (PEPCID) 20 MG tablet, Take 1 tablet (20 mg total) by mouth at bedtime., Disp: 90 tablet, Rfl: 3   losartan-hydrochlorothiazide (HYZAAR) 100-12.5 MG tablet, Take 0.5 tablets by mouth in the morning., Disp: , Rfl:    metoprolol succinate (TOPROL-XL) 25 MG 24 hr tablet, Take 1 tablet (25 mg total) by mouth daily., Disp: 90 tablet, Rfl: 3   nitroGLYCERIN (NITROSTAT) 0.4 MG SL tablet, Place 1 tablet (0.4 mg total) under the tongue every 5 (five) minutes as needed for chest pain., Disp: 25 tablet, Rfl: 1   omeprazole (PRILOSEC) 40 MG capsule, Take 40 mg by mouth daily., Disp: , Rfl:    simvastatin (ZOCOR) 40 MG tablet, Take 1 tablet (40 mg total) by mouth daily.,  Disp: 90 tablet, Rfl: 3   temazepam (RESTORIL) 15 MG capsule, Take 15 mg by mouth at bedtime as needed for sleep., Disp: , Rfl:    terazosin (HYTRIN) 5 MG capsule, Take 5 mg by mouth at bedtime., Disp: , Rfl:    ticagrelor (BRILINTA) 90 MG TABS tablet, Take 1 tablet (90 mg total) by mouth 2 (two) times daily., Disp: 180 tablet, Rfl: 3   triamcinolone ointment (KENALOG) 0.5 %, Apply 1 application topically  2 (two) times daily., Disp: 30 g, Rfl: 0   albuterol (VENTOLIN HFA) 108 (90 Base) MCG/ACT inhaler, Inhale 2 puffs into the lungs every 6 (six) hours as needed for wheezing or shortness of breath. (Patient not taking: Reported on 05/12/2023), Disp: 8 g, Rfl: 2

## 2023-05-13 ENCOUNTER — Encounter: Payer: Self-pay | Admitting: Cardiology

## 2023-05-13 ENCOUNTER — Other Ambulatory Visit (HOSPITAL_COMMUNITY): Payer: Self-pay | Admitting: Cardiology

## 2023-05-13 ENCOUNTER — Ambulatory Visit: Payer: Medicare Other | Attending: Cardiology | Admitting: Cardiology

## 2023-05-13 VITALS — BP 150/70 | HR 56 | Resp 15 | Ht 69.0 in | Wt 193.0 lb

## 2023-05-13 DIAGNOSIS — I25118 Atherosclerotic heart disease of native coronary artery with other forms of angina pectoris: Secondary | ICD-10-CM

## 2023-05-13 DIAGNOSIS — E7800 Pure hypercholesterolemia, unspecified: Secondary | ICD-10-CM | POA: Diagnosis not present

## 2023-05-13 DIAGNOSIS — K449 Diaphragmatic hernia without obstruction or gangrene: Secondary | ICD-10-CM | POA: Diagnosis not present

## 2023-05-13 DIAGNOSIS — I2729 Other secondary pulmonary hypertension: Secondary | ICD-10-CM

## 2023-05-13 DIAGNOSIS — R0609 Other forms of dyspnea: Secondary | ICD-10-CM

## 2023-05-13 NOTE — Patient Instructions (Signed)
Medication Instructions:  Your physician recommends that you continue on your current medications as directed. Please refer to the Current Medication list given to you today.  *If you need a refill on your cardiac medications before your next appointment, please call your pharmacy*   Lab Work: Have lab work done at [facility] on the first floor today--BMP If you have labs (blood work) drawn today and your tests are completely normal, you will receive your results only by: MyChart Message (if you have MyChart) OR A paper copy in the mail If you have any lab test that is abnormal or we need to change your treatment, we will call you to review the results.   Testing/Procedures: none   Follow-Up: At [facility], you and your health needs are our priority.  As part of our continuing mission to provide you with exceptional heart care, we have created designated Provider Care Teams.  These Care Teams include your primary Cardiologist (physician) and Advanced Practice Providers (APPs -  Physician Assistants and Nurse Practitioners) who all work together to provide you with the care you need, when you need it.  We recommend signing up for the patient portal called "MyChart".  Sign up information is provided on this After Visit Summary.  MyChart is used to connect with patients for Virtual Visits (Telemedicine).  Patients are able to view lab/test results, encounter notes, upcoming appointments, etc.  Non-urgent messages can be sent to your provider as well.   To learn more about what you can do with MyChart, go to [email or web address].    Your next appointment:   6 month(s)  Provider:   Yates Decamp, MD     Other Instructions

## 2023-05-13 NOTE — Progress Notes (Signed)
Cardiology Office Note:  .   Date:  05/13/2023  ID:  Raymond Stanley, DOB October 31, 1945, MRN [id number] PCP: Emilio Aspen, MD   [facility]Care Providers Cardiologist:  Yates Decamp, MD   History of Present Illness: .   Raymond Stanley is a 77 y.o. Caucasian male patient with hypertension, hypercholesterolemia, hypovitaminosis D, erectile dysfunction who is a non-smoker, presented to hospital on 02/23/2023 with acute inferior STEMI SP stenting to RCA, has moderate disease in the LAD.  This is a 84-month office visit.    Discussed the use of AI scribe software for clinical note transcription with the patient, who gave verbal consent to proceed.  He was referred for pulmonary medicine in view of chronic severe dyspnea felt to be noncardiac.  History of Present Illness   Raymond Stanley, a patient with a history of coronary artery disease (CAD) and recent myocardial infarction (MI) on 02/23/23, presents for follow-up. Post-MI, a stent was placed and he has been tolerating his medications well with no reported angina. However, he has been experiencing persistent shortness of breath, which led to a referral to a pulmonologist.  The pulmonologist identified a large hiatal hernia, which is suspected to be pressing on one of the veins in his heart. This may be contributing to his breathlessness. The patient also reports acid reflux, which is likely related to the hiatal hernia. He has been living with these symptoms for three to four years.  In addition, Raymond Stanley has a history of high cholesterol. He is currently on a combination of simvastatin (Zocor) and ezetimibe, which has effectively managed his cholesterol levels.  The patient also reports a reduction in his amlodipine dosage from 10mg  to 5mg  due to consistently low blood pressure. He monitors his blood pressure at home and reports readings generally around 125/80 mmHg.      Review of Systems  Cardiovascular:  Positive for dyspnea on exertion.  Negative for chest pain and leg swelling.    Labs   Lab Results  Component Value Date   CHOL 125 03/25/2023   HDL 42 03/25/2023   LDLCALC 65 03/25/2023   TRIG 95 03/25/2023   CHOLHDL 3.0 03/25/2023   Lab Results  Component Value Date   NA 141 03/25/2023   K 3.9 03/25/2023   CO2 23 03/25/2023   GLUCOSE 100 (H) 03/25/2023   BUN 18 03/25/2023   CREATININE 1.47 (H) 03/25/2023   CALCIUM 9.8 03/25/2023   EGFR 49 (L) 03/25/2023   GFRNONAA >60 02/24/2023      Latest Ref Rng & Units 03/25/2023    9:12 AM 02/24/2023    2:52 AM 02/23/2023    7:45 PM  BMP  Glucose 70 - 99 mg/dL 010  272    BUN 8 - 27 mg/dL 18  14    Creatinine 5.36 - 1.27 mg/dL 6.44  0.34  7.42   BUN/Creat Ratio 10 - 24 12     Sodium 134 - 144 mmol/L 141  135    Potassium 3.5 - 5.2 mmol/L 3.9  3.9    Chloride 96 - 106 mmol/L 105  105    CO2 20 - 29 mmol/L 23  24    Calcium 8.6 - 10.2 mg/dL 9.8  8.8        Latest Ref Rng & Units 03/25/2023    9:12 AM 02/24/2023    2:52 AM 02/23/2023    7:45 PM  CBC  WBC 3.4 - 10.8 x10E3/uL 5.8  7.9  7.0  Hemoglobin 13.0 - 17.7 g/dL 69.6  29.5  28.4   Hematocrit 37.5 - 51.0 % 41.7  39.7  40.5   Platelets 150 - 450 x10E3/uL 156  151  67     Physical Exam:   VS:  BP (!) 150/70 Comment: left arm  Pulse (!) 56   Resp 15   Ht 5\' 9"  (1.753 m)   Wt 193 lb (87.5 kg)   SpO2 94%   BMI 28.50 kg/m    Wt Readings from Last 3 Encounters:  05/13/23 193 lb (87.5 kg)  05/12/23 192 lb (87.1 kg)  03/04/23 187 lb 9.6 oz (85.1 kg)     Physical Exam Neck:     Vascular: No carotid bruit or JVD.  Cardiovascular:     Rate and Rhythm: Normal rate and regular rhythm.     Pulses: Normal pulses and intact distal pulses.     Heart sounds: Murmur heard.     Early diastolic murmur is present with a grade of 2/4 at the upper left sternal border.     No gallop.  Pulmonary:     Effort: Pulmonary effort is normal.     Breath sounds: Normal breath sounds.  Abdominal:     General: Bowel  sounds are normal.     Palpations: Abdomen is soft.  Musculoskeletal:     Right lower leg: No edema.     Left lower leg: No edema.     Studies Reviewed: Marland Kitchen    Coronary angiogram 02/23/2023: Acute ST segment elevation myocardial infarction secondary to total mid distal PDA occlusion in a dominant RCA with high-grade mid stenoses. Severe multivessel coronary calcification with 50 and 60% proximal LAD stenoses, irregularity of the left circumflex vessel with 30 to 40% stenoses. Successful PCI to the segmental 75 and 85% mid RCA stenoses with ultimate insertion of a 3.5 x 38 mm Onyx frontier stent postdilated to 3.78 mm with the stenoses being reduced to 0%.     ECHOCARDIOGRAM COMPLETE 03/25/2023  1. Left ventricular ejection fraction, by estimation, is 45 to 50%. The left ventricle has mildly decreased function. The left ventricle demonstrates regional wall motion abnormalities with inferoseptal and inferior hypokinesis. Left ventricular diastolic parameters are consistent with Grade I diastolic dysfunction (impaired relaxation). 2. Right ventricular systolic function is normal. The right ventricular size is mildly enlarged. There is normal pulmonary artery systolic pressure. The estimated right ventricular systolic pressure is 27.6 mmHg. 3. Left atrial size was mild to moderately dilated. 4. Right atrial size was mildly dilated. 5. The mitral valve is normal in structure. Trivial mitral valve regurgitation. No evidence of mitral stenosis. 6. The aortic valve is tricuspid. There is moderate calcification of the aortic valve. Aortic valve regurgitation is not visualized. Aortic valve sclerosis/calcification is present, without any evidence of aortic stenosis. 7. The inferior vena cava is normal in size with greater than 50% respiratory variability, suggesting right atrial pressure of 3 mmHg.  EKG:         EKG 03/04/2023: Normal sinus rhythm at rate of 74 bpm, left anterior fascicular block.  Poor R progression, probably normal variant. LVH by voltage. No evidence of ischemia.   Medications and allergies    Allergies  Allergen Reactions   Ace Inhibitors    Crestor [Rosuvastatin]    Norvasc [Amlodipine Besylate]    Carac [Fluorouracil] Rash   Lipitor [Atorvastatin] Other (See Comments)    Stated it "tore his shoulders up" with pain     Current Outpatient Medications:  albuterol (VENTOLIN HFA) 108 (90 Base) MCG/ACT inhaler, Inhale 2 puffs into the lungs every 6 (six) hours as needed for wheezing or shortness of breath., Disp: 8 g, Rfl: 2   amLODipine (NORVASC) 10 MG tablet, Take 1 tablet (10 mg total) by mouth daily. (Patient taking differently: Take 5 mg by mouth daily. Patient is taking 5mg ), Disp: 90 tablet, Rfl: 3   aspirin 81 MG tablet, Take 81 mg by mouth daily., Disp: , Rfl:    Cholecalciferol (VITAMIN D3) 50 MCG (2000 UT) capsule, Take 2,000 Units by mouth daily., Disp: , Rfl:    co-enzyme Q-10 30 MG capsule, Take 30 mg by mouth 3 (three) times daily., Disp: , Rfl:    Cyanocobalamin (VITAMIN B12) 1000 MCG TBCR, Take 1 capsule by mouth daily., Disp: , Rfl:    cyclobenzaprine (FLEXERIL) 10 MG tablet, Take 10 mg by mouth 3 (three) times daily as needed for muscle spasms., Disp: , Rfl:    diclofenac sodium (VOLTAREN) 1 % GEL, Apply 2-4 g topically 4 (four) times daily. (Patient taking differently: Apply 2-4 g topically daily as needed (artheritis).), Disp: 15 Tube, Rfl: 4   ezetimibe (ZETIA) 10 MG tablet, Take 1 tablet (10 mg total) by mouth daily., Disp: 90 tablet, Rfl: 3   famotidine (PEPCID) 20 MG tablet, Take 1 tablet (20 mg total) by mouth at bedtime., Disp: 90 tablet, Rfl: 3   losartan-hydrochlorothiazide (HYZAAR) 100-12.5 MG tablet, Take 0.5 tablets by mouth in the morning., Disp: , Rfl:    metoprolol succinate (TOPROL-XL) 25 MG 24 hr tablet, Take 1 tablet (25 mg total) by mouth daily., Disp: 90 tablet, Rfl: 3   nitroGLYCERIN (NITROSTAT) 0.4 MG SL tablet, Place 1  tablet (0.4 mg total) under the tongue every 5 (five) minutes as needed for chest pain., Disp: 25 tablet, Rfl: 1   omeprazole (PRILOSEC) 40 MG capsule, Take 40 mg by mouth daily., Disp: , Rfl:    simvastatin (ZOCOR) 40 MG tablet, Take 1 tablet (40 mg total) by mouth daily., Disp: 90 tablet, Rfl: 3   temazepam (RESTORIL) 15 MG capsule, Take 15 mg by mouth at bedtime as needed for sleep., Disp: , Rfl:    terazosin (HYTRIN) 5 MG capsule, Take 5 mg by mouth at bedtime., Disp: , Rfl:    ticagrelor (BRILINTA) 90 MG TABS tablet, Take 1 tablet (90 mg total) by mouth 2 (two) times daily., Disp: 180 tablet, Rfl: 3   triamcinolone ointment (KENALOG) 0.5 %, Apply 1 application topically 2 (two) times daily., Disp: 30 g, Rfl: 0   ASSESSMENT AND PLAN: .      ICD-10-CM   1. Coronary artery disease of native artery of native heart with stable angina pectoris (HCC)  I25.118 Basic Metabolic Panel (BMET)    2. Dyspnea on exertion  R06.09 Basic Metabolic Panel (BMET)    3. Hiatal hernia  K44.9 Basic Metabolic Panel (BMET)    4. Pure hypercholesterolemia  E78.00 Basic Metabolic Panel (BMET)      He was evaluated by pulmonary medicine due to persistent dyspnea and was found to have a very large hiatal hernia and possibly also encroaching upon pulmonary vein.  Dr. Charolett Bumpers has had opinion with Dr. Cliffton Asters (CT surgery) regarding proceeding with surgery.  Assessment and Plan    Coronary Artery Disease Post myocardial infarction on 02/23/2023 with stent placement. No current angina. Medications tolerated well. -Continue current medications.  Hiatal Hernia Significant size, causing shortness of breath and acid reflux. Possible compression of right  inferior pulmonary vein. Discussed potential surgical intervention with Dr. Cliffton Asters. Patient undecided about surgery.  Very minimal right inferior pulm vein compression if present is clinically inconsequential. -If patient decides on surgery, ensure at least 6  months post myocardial infarction before proceeding.  Hypertension Patient self-adjusted Amlodipine dose from 10mg  to 5mg  due to persistent low blood pressure. Today's blood pressure was high. -Continue monitoring blood pressure at home. Contact office if consistently >130/80 mmHg. On his last office visit had reduced the dose of losartan HCT 100/25 mg to 1/2 tablet daily in view of elevated serum creatinine, and will obtain baseline BMP today.  Hyperlipidemia On Simvastatin and Ezetimibe. Cholesterol levels are optimal. -Continue Simvastatin and Ezetimibe.  General Health Maintenance / Followup Plans -Return in 6 months for routine follow-up. -Continue all current medications. Do not stop any medication without informing the office. -Permit for minor procedures (dental crown, Mohs skin surgery).     Signed,  Yates Decamp, MD, [facility] 05/13/2023, 1:21 PM [facility] [address] [place], [place] [address] Phone: [phone]. Fax:  [phone]

## 2023-05-14 ENCOUNTER — Encounter: Payer: Self-pay | Admitting: Pulmonary Disease

## 2023-05-14 DIAGNOSIS — K219 Gastro-esophageal reflux disease without esophagitis: Secondary | ICD-10-CM

## 2023-05-14 LAB — BASIC METABOLIC PANEL
BUN/Creatinine Ratio: 16 (ref 10–24)
BUN: 16 mg/dL (ref 8–27)
CO2: 25 mmol/L (ref 20–29)
Calcium: 9.8 mg/dL (ref 8.6–10.2)
Chloride: 104 mmol/L (ref 96–106)
Creatinine, Ser: 1.02 mg/dL (ref 0.76–1.27)
Glucose: 110 mg/dL — ABNORMAL HIGH (ref 70–99)
Potassium: 4 mmol/L (ref 3.5–5.2)
Sodium: 140 mmol/L (ref 134–144)
eGFR: 76 mL/min/{1.73_m2} (ref >59)

## 2023-05-14 NOTE — Telephone Encounter (Signed)
Dr.Dewald, please see mychart sent by pt's daughter and advise.

## 2023-05-17 ENCOUNTER — Telehealth: Payer: Self-pay | Admitting: Cardiology

## 2023-05-17 NOTE — Telephone Encounter (Signed)
Left voicemail to return call to office.

## 2023-05-17 NOTE — Telephone Encounter (Signed)
Patient called to follow-up on referral to pharmacist and stated he did not want this referral.

## 2023-05-19 ENCOUNTER — Encounter: Payer: Self-pay | Admitting: Cardiology

## 2023-05-19 NOTE — Telephone Encounter (Signed)
Patient is returning phone call.  °

## 2023-05-19 NOTE — Telephone Encounter (Signed)
PCC's- pt asking about the ct and when it will be done thanks

## 2023-05-19 NOTE — Telephone Encounter (Signed)
I spoke with patient who reports he received a phone call to set up an appointment with pharmacist for his cholesterol.  He is asking why he needs this as his cholesterol was OK at last office visit.  I told patient I did not see pharmacy referral but I would check with Dr Jacinto Halim and if any referral was needed we would call him back.

## 2023-05-19 NOTE — Telephone Encounter (Signed)
Scheduled PT and will call Pt about upcoming appointment.

## 2023-05-19 NOTE — Telephone Encounter (Signed)
Raymond Stanley, please inform the patient that I did not make any referral for pharmacy.  I did make a referral for him to see Dr. Cliffton Asters regarding hiatal hernia surgery.  Dr. Cliffton Asters would like to see the anatomy of the chest hence would like to proceed with CT angiogram as previously dictated, we will go ahead and order that.  I will request Hunt[facility] Dec to place orders.  Huntley Dec, this is the same patient that we wanted pulmonary vein morphology that Dr. Shirlee Latch and I had spoken to.  Appreciate your help.

## 2023-05-19 NOTE — Telephone Encounter (Signed)
Patient checking on scheduling CT scan. Also needs RX for Pepcid. Patient phone number is [phone].

## 2023-05-19 NOTE — Telephone Encounter (Signed)
 I spoke with patient and gave him message from Dr Jacinto Halim

## 2023-05-20 ENCOUNTER — Other Ambulatory Visit (HOSPITAL_COMMUNITY): Payer: Self-pay | Admitting: Cardiology

## 2023-05-20 DIAGNOSIS — I2729 Other secondary pulmonary hypertension: Secondary | ICD-10-CM

## 2023-05-20 MED ORDER — FAMOTIDINE 20 MG PO TABS: 20.0000 mg | ORAL_TABLET | Freq: Every day | ORAL | 0 refills | Status: DC

## 2023-05-20 NOTE — Telephone Encounter (Signed)
I think we should do what you had ordered that was appropriate. So sorry about the confusion

## 2023-05-23 ENCOUNTER — Other Ambulatory Visit: Payer: Self-pay | Admitting: *Deleted

## 2023-05-23 MED ORDER — FAMOTIDINE 20 MG PO TABS: 20.0000 mg | ORAL_TABLET | Freq: Two times a day (BID) | ORAL | 0 refills | Status: DC

## 2023-05-31 ENCOUNTER — Ambulatory Visit (HOSPITAL_COMMUNITY)
Admission: RE | Admit: 2023-05-31 | Discharge: 2023-05-31 | Disposition: A | Discharge status: Home / Self Care | Payer: Medicare Other | Source: Ambulatory Visit | Attending: Pulmonary Disease | Admitting: Pulmonary Disease

## 2023-05-31 DIAGNOSIS — I700 Atherosclerosis of aorta: Secondary | ICD-10-CM | POA: Diagnosis not present

## 2023-05-31 DIAGNOSIS — J479 Bronchiectasis, uncomplicated: Secondary | ICD-10-CM | POA: Diagnosis not present

## 2023-05-31 DIAGNOSIS — R918 Other nonspecific abnormal finding of lung field: Secondary | ICD-10-CM | POA: Diagnosis not present

## 2023-05-31 DIAGNOSIS — K449 Diaphragmatic hernia without obstruction or gangrene: Secondary | ICD-10-CM | POA: Diagnosis not present

## 2023-05-31 DIAGNOSIS — J9811 Atelectasis: Secondary | ICD-10-CM | POA: Diagnosis not present

## 2023-05-31 MED ORDER — IOHEXOL 350 MG/ML SOLN
50.0000 mL | Freq: Once | INTRAVENOUS | Status: AC | PRN
  Administered 2023-05-31: 50 mL via INTRAVENOUS

## 2023-06-10 MED ORDER — FAMOTIDINE 20 MG PO TABS: 20.0000 mg | ORAL_TABLET | Freq: Every day | ORAL | 1 refills | Status: DC

## 2023-06-10 NOTE — Addendum Note (Signed)
 Addended by: Lajoyce Lauber A on: 06/10/2023 08:02 PM   Modules accepted: Orders

## 2023-06-22 ENCOUNTER — Encounter: Payer: Self-pay | Admitting: Emergency Medicine

## 2023-06-22 ENCOUNTER — Ambulatory Visit
Admission: EM | Admit: 2023-06-22 | Discharge: 2023-06-22 | Disposition: A | Discharge status: Home / Self Care | Payer: Medicare Other

## 2023-06-22 DIAGNOSIS — I10 Essential (primary) hypertension: Secondary | ICD-10-CM | POA: Insufficient documentation

## 2023-06-22 DIAGNOSIS — J209 Acute bronchitis, unspecified: Secondary | ICD-10-CM | POA: Diagnosis not present

## 2023-06-22 DIAGNOSIS — N400 Enlarged prostate without lower urinary tract symptoms: Secondary | ICD-10-CM | POA: Insufficient documentation

## 2023-06-22 MED ORDER — DOXYCYCLINE HYCLATE 100 MG PO CAPS: 100.0000 mg | ORAL_CAPSULE | Freq: Two times a day (BID) | ORAL | 0 refills | Status: DC

## 2023-06-22 MED ORDER — PREDNISONE 20 MG PO TABS: 20.0000 mg | ORAL_TABLET | Freq: Every day | ORAL | 0 refills | Status: AC

## 2023-06-22 MED ORDER — HYDROCODONE BIT-HOMATROP MBR 5-1.5 MG/5ML PO SOLN: 2.5000 mL | Freq: Every evening | ORAL | 0 refills | Status: DC | PRN

## 2023-06-22 NOTE — ED Provider Notes (Signed)
[facility]    CSN: [id number] Arrival date & time: 06/22/23  0807      History   Chief Complaint Chief Complaint  Patient presents with   Cough    HPI Raymond Stanley is a 78 y.o. male.  Patient with a history of chronic shortness of breath, CAD, status post STEMI, hypertension resents today for evaluation of cough.  Patient has a chronic history of recurrent cough followed by pulmonology.  Patient reportedly has a history of exposure to asbestos.  Patient reports current symptoms have been present for approximately 5 to 6 days.  He has albuterol and neb machine at home however reports this worsens his symptoms of shortness of breath. Past Medical History:  Diagnosis Date   Actinic keratoses    BPH (benign prostatic hypertrophy)    ED (erectile dysfunction)    Esophageal reflux    Hearing loss    Other and unspecified hyperlipidemia    Premature atrial contractions    Pulmonary emboli (HCC)    Pulmonary emboli (HCC)    Skin cancer    Unspecified essential hypertension     Patient Active Problem List   Diagnosis Date Noted   BPH (benign prostatic hyperplasia) 06/22/2023   HTN (hypertension) 06/22/2023   Hypertension 02/25/2023   Hyperlipidemia 02/25/2023   STEMI involving right coronary artery (HCC) 02/23/2023   Left ventricular dysfunction with reduced left ventricular function 10/30/2020   Neck mass 08/19/2020   GERD (gastroesophageal reflux disease) 07/10/2020   Hiatal hernia 07/10/2020   Pleuritic pain 07/10/2020   Aortic atherosclerosis (HCC) 06/05/2020   Left-sided chest wall pain 06/05/2020   Unilateral primary osteoarthritis, left knee 09/22/2016    Past Surgical History:  Procedure Laterality Date   CORONARY/GRAFT ACUTE MI REVASCULARIZATION N/A 02/23/2023   Procedure: Coronary/Graft Acute MI Revascularization;  Surgeon: Lennette Bihari, MD;  Location[facility] INVASIVE CV LAB;  Service: Cardiovascular;  Laterality: N/A;   EXCISION MASS NECK Right  05/08/2021   Procedure: EXCISION RIGHT SUPERFICIAL NECK MASS UNDER LOCAL ANESTHESIA;  Surgeon: Linus Salmons, MD;  Location: [facility];  Service: ENT;  Laterality: Right;   INGUINAL HERNIA REPAIR Right        Home Medications    Prior to Admission medications   Medication Sig Start Date End Date Taking? Authorizing Provider  aspirin 81 MG tablet Take 81 mg by mouth daily.   Yes [provider]  Cholecalciferol (VITAMIN D3) 50 MCG (2000 UT) capsule Take 2,000 Units by mouth daily.   Yes [provider]  co-enzyme Q-10 30 MG capsule Take 30 mg by mouth 3 (three) times daily.   Yes [provider]  Cyanocobalamin (VITAMIN B12) 1000 MCG TBCR Take 1 capsule by mouth daily.   Yes [provider]  doxycycline (VIBRAMYCIN) 100 MG capsule Take 1 capsule (100 mg total) by mouth 2 (two) times daily. 06/22/23  Yes Bing Neighbors, NP  famotidine (PEPCID) 20 MG tablet Take 1 tablet (20 mg total) by mouth 2 (two) times daily. 05/23/23  Yes Martina Sinner, MD  HYDROcodone bit-homatropine (HYCODAN) 5-1.5 MG/5ML syrup Take 2.5 mLs by mouth at bedtime as needed for cough. 06/22/23  Yes Bing Neighbors, NP  losartan-hydrochlorothiazide (HYZAAR) 100-12.5 MG tablet Take 0.5 tablets by mouth in the morning. 03/29/23  Yes Yates Decamp, MD  metoprolol succinate (TOPROL-XL) 25 MG 24 hr tablet Take 1 tablet (25 mg total) by mouth daily. 03/04/23  Yes Yates Decamp, MD  omeprazole (PRILOSEC) 40 MG  capsule Take 40 mg by mouth daily.   Yes [provider]  predniSONE (DELTASONE) 20 MG tablet Take 1 tablet (20 mg total) by mouth daily with breakfast for 5 days. 06/22/23 06/27/23 Yes Bing Neighbors, NP  simvastatin (ZOCOR) 40 MG tablet Take 1 tablet (40 mg total) by mouth daily. 03/04/23  Yes Yates Decamp, MD  temazepam (RESTORIL) 15 MG capsule Take 15 mg by mouth daily at 6 (six) AM. 05/16/23  Yes [provider]  ticagrelor (BRILINTA) 90 MG TABS tablet  Take 1 tablet (90 mg total) by mouth 2 (two) times daily. 03/04/23  Yes Yates Decamp, MD  albuterol (VENTOLIN HFA) 108 (90 Base) MCG/ACT inhaler Inhale 2 puffs into the lungs every 6 (six) hours as needed for wheezing or shortness of breath. 09/29/20   Willy Eddy, MD  amLODipine (NORVASC) 10 MG tablet Take 1 tablet (10 mg total) by mouth daily. Patient taking differently: Take 5 mg by mouth daily. Patient is taking 5mg  03/04/23   Yates Decamp, MD  cyclobenzaprine (FLEXERIL) 10 MG tablet Take 10 mg by mouth 3 (three) times daily as needed for muscle spasms. 09/10/21   [provider]  diclofenac sodium (VOLTAREN) 1 % GEL Apply 2-4 g topically 4 (four) times daily. Patient taking differently: Apply 2-4 g topically daily as needed (artheritis). 10/12/16   Valeria Batman, MD  ezetimibe (ZETIA) 10 MG tablet Take 1 tablet (10 mg total) by mouth daily. 03/04/23   Yates Decamp, MD  famotidine (PEPCID) 20 MG tablet Take 1 tablet (20 mg total) by mouth at bedtime. 06/10/23 06/09/24  Martina Sinner, MD  nitroGLYCERIN (NITROSTAT) 0.4 MG SL tablet Place 1 tablet (0.4 mg total) under the tongue every 5 (five) minutes as needed for chest pain. 02/25/23 02/25/24  Lennette Bihari, MD  temazepam (RESTORIL) 15 MG capsule Take 15 mg by mouth at bedtime as needed for sleep. 08/19/20   [provider]  terazosin (HYTRIN) 5 MG capsule Take 5 mg by mouth at bedtime. 04/29/22   [provider]  triamcinolone ointment (KENALOG) 0.5 % Apply 1 application topically 2 (two) times daily. 07/22/21   Mayers, Kasandra Knudsen, PA-C    Family History Family History  Problem Relation Age of Onset   Hypertension Mother    CAD Mother    Hypertension Father    CAD Father    Heart disease Brother    Hypertension Brother    Alzheimer's disease Brother    Hypertension Maternal Grandmother    CAD Maternal Grandmother    Asthma Daughter     Social History Social History   Tobacco Use   Smoking status: Never     Passive exposure: Never   Smokeless tobacco: Never  Vaping Use   Vaping status: Never Used  Substance Use Topics   Alcohol use: Yes    Comment: Rarely   Drug use: No     Allergies   Ace inhibitors, Crestor [rosuvastatin], Norvasc [amlodipine besylate], Carac [fluorouracil], and Lipitor [atorvastatin]   Review of Systems Review of Systems  Respiratory:  Positive for cough.      Physical Exam Triage Vital Signs ED Triage Vitals  Encounter Vitals Group     BP 06/22/23 0825 127/62     Systolic BP Percentile --      Diastolic BP Percentile --      Pulse Rate 06/22/23 0825 74     Resp 06/22/23 0825 20     Temp 06/22/23 0825 98 F (36.7  C)     Temp Source 06/22/23 0825 Oral     SpO2 06/22/23 0825 96 %     Weight 06/22/23 0821 192 lb 14.4 oz (87.5 kg)     Height 06/22/23 0821 5\' 9"  (1.753 m)     Head Circumference --      Peak Flow --      Pain Score 06/22/23 0820 0     Pain Loc --      Pain Education --      Exclude from Growth Chart --    No data found.  Updated Vital Signs BP 127/62 (BP Location: Left Arm)   Pulse 74   Temp 98 F (36.7 C) (Oral)   Resp 20   Ht 5\' 9"  (1.753 m)   Wt 192 lb 14.4 oz (87.5 kg)   SpO2 96%   BMI 28.49 kg/m   Visual Acuity Right Eye Distance:   Left Eye Distance:   Bilateral Distance:    Right Eye Near:   Left Eye Near:    Bilateral Near:     Physical Exam Vitals reviewed.  Constitutional:      Appearance: He is ill-appearing.  HENT:     Head: Normocephalic and atraumatic.     Nose: Congestion present.  Eyes:     Extraocular Movements: Extraocular movements intact.     Conjunctiva/sclera: Conjunctivae normal.     Pupils: Pupils are equal, round, and reactive to light.  Cardiovascular:     Rate and Rhythm: Normal rate and regular rhythm.  Pulmonary:     Breath sounds: Decreased air movement present. Rhonchi present.  Musculoskeletal:        General: Normal range of motion.     Cervical back: Normal range of motion  and neck supple.  Skin:    General: Skin is warm and dry.     Capillary Refill: Capillary refill takes less than 2 seconds.  Neurological:     General: No focal deficit present.     Mental Status: He is alert and oriented to person, place, and time.      UC Treatments / Results  Labs (all labs ordered are listed, but only abnormal results are displayed) Labs Reviewed - No data to display  EKG   Radiology No results found.  Procedures Procedures (including critical care time)  Medications Ordered in UC Medications - No data to display  Initial Impression / Assessment and Plan / UC Course  I have reviewed the triage vital signs and the nursing notes.  Pertinent labs & imaging results that were available during my care of the patient were reviewed by me and considered in my medical decision making (see chart for details).    Treating for acute bronchitis, questionable viral versus bacterial.  Given symptoms have been present greater than 6 days will cover empirically with doxycycline 100 mg twice daily for 10 days and prednisone 20 mg once daily for 5 days.  Patient was seen and treated here last year with the same course of symptoms and he reports improvement with this course of treatment however requested a small quantity of Hycodan cough syrup which he has previously tolerated to help with sleep due to severe cough.  Patient given strict return precautions if symptoms worsen or do not improve.  Patient verbalized understanding and agreement with plan. Final diagnoses:  Acute bronchitis, unspecified organism     Discharge Instructions      Take medication as prescribed.  For cough medication can cause  severe drowsiness this is written for you only to take at bedtime to help improve cough and ability to sleep.  Recommend over-the-counter Delsym during the day to manage cough.  If symptoms worsen or do not improve with treatment return for evaluation.  If you develop any  severe shortness of breath or chest tightness go immediately to the nearest emergency department.     ED Prescriptions     Medication Sig Dispense Auth. Provider   predniSONE (DELTASONE) 20 MG tablet Take 1 tablet (20 mg total) by mouth daily with breakfast for 5 days. 5 tablet Bing Neighbors, NP   doxycycline (VIBRAMYCIN) 100 MG capsule Take 1 capsule (100 mg total) by mouth 2 (two) times daily. 20 capsule Bing Neighbors, NP   HYDROcodone bit-homatropine (HYCODAN) 5-1.5 MG/5ML syrup Take 2.5 mLs by mouth at bedtime as needed for cough. 100 mL Bing Neighbors, NP      I have reviewed the PDMP during this encounter.   Bing Neighbors, NP 06/22/23 [phone]

## 2023-06-22 NOTE — ED Triage Notes (Signed)
Pt presents with cough. Pt states symptoms onset 06/17/23. He also states he believes he has bronchitis. Also states he has nebulizer at home but doesn't like to use it because it causes him to have SOB.

## 2023-06-22 NOTE — Discharge Instructions (Signed)
Take medication as prescribed.  For cough medication can cause severe drowsiness this is written for you only to take at bedtime to help improve cough and ability to sleep.  Recommend over-the-counter Delsym during the day to manage cough.  If symptoms worsen or do not improve with treatment return for evaluation.  If you develop any severe shortness of breath or chest tightness go immediately to the nearest emergency department.

## 2023-06-23 NOTE — Progress Notes (Signed)
[address]       [place] [address]             [phone]                    [name] [facility] Medical Record #[id number] Date of Birth: [birth year]  Referring: Raymond Sinner, MD Primary Care: Raymond Aspen, MD Primary Cardiologist: Raymond Decamp, MD  Chief Complaint:    Chief Complaint  Patient presents with   Hiatal Hernia    CT chest 12/31    History of Present Illness:    Raymond Stanley 78 y.o. male presents for surgical evaluation of a hiatal hernia.  Earlier this fall, he was treated with PCI for CAD, but has had persistent exertional shortness of breath.  He has been evaluated by cardiology and pulmonary and it is felt that his ongoing shortness of breath is related to his hiatal hernia.     Of note, he has had progressive reflux that has been refractory to medical therapy.  He has also had recurrent upper respiratory infections.  He denies any dysphagia or odynophagia.   Past Medical History:  Diagnosis Date   Actinic keratoses    BPH (benign prostatic hypertrophy)    ED (erectile dysfunction)    Esophageal reflux    Hearing loss    Other and unspecified hyperlipidemia    Premature atrial contractions    Pulmonary emboli (HCC)    Pulmonary emboli (HCC)    Skin cancer    Unspecified essential hypertension     Past Surgical History:  Procedure Laterality Date   CORONARY/GRAFT ACUTE MI REVASCULARIZATION N/A 02/23/2023   Procedure: Coronary/Graft Acute MI Revascularization;  Surgeon: Lennette Bihari, MD;  Location: [facility];  Service: Cardiovascular;  Laterality: N/A;   EXCISION MASS NECK Right 05/08/2021   Procedure: EXCISION RIGHT SUPERFICIAL NECK MASS UNDER LOCAL ANESTHESIA;  Surgeon: Linus Salmons, MD;  Location: [facility];  Service: ENT;  Laterality: Right;   INGUINAL HERNIA REPAIR Right     Family History  Problem Relation Age of Onset   Hypertension Mother    CAD Mother    Hypertension  Father    CAD Father    Heart disease Brother    Hypertension Brother    Alzheimer's disease Brother    Hypertension Maternal Grandmother    CAD Maternal Grandmother    Asthma Daughter      Social History   Tobacco Use  Smoking Status Never   Passive exposure: Never  Smokeless Tobacco Never    Social History   Substance and Sexual Activity  Alcohol Use Yes   Comment: Rarely     Allergies  Allergen Reactions   Ace Inhibitors    Crestor [Rosuvastatin]    Norvasc [Amlodipine Besylate]    Carac [Fluorouracil] Rash   Lipitor [Atorvastatin] Other (See Comments)    Stated it "tore his shoulders up" with pain    Current Outpatient Medications  Medication Sig Dispense Refill   albuterol (VENTOLIN HFA) 108 (90 Base) MCG/ACT inhaler Inhale 2 puffs into the lungs every 6 (six) hours as needed for wheezing or shortness of breath. 8 g 2   amLODipine (NORVASC) 10 MG tablet Take 1 tablet (10 mg total) by mouth daily. (Patient taking differently: Take 5 mg by mouth daily. Patient is taking 5mg ) 90 tablet 3   aspirin 81 MG tablet Take 81 mg by mouth daily.  Cholecalciferol (VITAMIN D3) 50 MCG (2000 UT) capsule Take 2,000 Units by mouth daily.     co-enzyme Q-10 30 MG capsule Take 30 mg by mouth 3 (three) times daily.     Cyanocobalamin (VITAMIN B12) 1000 MCG TBCR Take 1 capsule by mouth daily.     cyclobenzaprine (FLEXERIL) 10 MG tablet Take 10 mg by mouth 3 (three) times daily as needed for muscle spasms.     diclofenac sodium (VOLTAREN) 1 % GEL Apply 2-4 g topically 4 (four) times daily. (Patient taking differently: Apply 2-4 g topically daily as needed (artheritis).) 15 Tube 4   doxycycline (VIBRAMYCIN) 100 MG capsule Take 1 capsule (100 mg total) by mouth 2 (two) times daily. 20 capsule 0   ezetimibe (ZETIA) 10 MG tablet Take 1 tablet (10 mg total) by mouth daily. 90 tablet 3   famotidine (PEPCID) 20 MG tablet Take 1 tablet (20 mg total) by mouth 2 (two) times daily. 90 tablet 0    famotidine (PEPCID) 20 MG tablet Take 1 tablet (20 mg total) by mouth at bedtime. 90 tablet 1   HYDROcodone bit-homatropine (HYCODAN) 5-1.5 MG/5ML syrup Take 2.5 mLs by mouth at bedtime as needed for cough. 100 mL 0   losartan-hydrochlorothiazide (HYZAAR) 100-12.5 MG tablet Take 0.5 tablets by mouth in the morning.     metoprolol succinate (TOPROL-XL) 25 MG 24 hr tablet Take 1 tablet (25 mg total) by mouth daily. 90 tablet 3   nitroGLYCERIN (NITROSTAT) 0.4 MG SL tablet Place 1 tablet (0.4 mg total) under the tongue every 5 (five) minutes as needed for chest pain. 25 tablet 1   omeprazole (PRILOSEC) 40 MG capsule Take 40 mg by mouth daily.     predniSONE (DELTASONE) 20 MG tablet Take 1 tablet (20 mg total) by mouth daily with breakfast for 5 days. 5 tablet 0   simvastatin (ZOCOR) 40 MG tablet Take 1 tablet (40 mg total) by mouth daily. 90 tablet 3   temazepam (RESTORIL) 15 MG capsule Take 15 mg by mouth at bedtime as needed for sleep.     temazepam (RESTORIL) 15 MG capsule Take 15 mg by mouth daily at 6 (six) AM.     terazosin (HYTRIN) 5 MG capsule Take 5 mg by mouth at bedtime.     ticagrelor (BRILINTA) 90 MG TABS tablet Take 1 tablet (90 mg total) by mouth 2 (two) times daily. 180 tablet 3   triamcinolone ointment (KENALOG) 0.5 % Apply 1 application topically 2 (two) times daily. 30 g 0   No current facility-administered medications for this visit.    Review of Systems  Constitutional:  Negative for malaise/fatigue.  Respiratory:  Positive for cough and shortness of breath.   Cardiovascular:  Negative for chest pain.  Gastrointestinal:  Positive for heartburn. Negative for abdominal pain and vomiting.  Neurological: Negative.      PHYSICAL EXAMINATION: BP (!) 152/87 (BP Location: Left Arm, Patient Position: Sitting, Cuff Size: Normal)   Pulse 77   Resp 20   Ht 5\' 9"  (1.753 m)   Wt 188 lb 3.2 oz (85.4 kg)   SpO2 98% Comment: RA  BMI 27.79 kg/m  Physical Exam Constitutional:       General: He is not in acute distress.    Appearance: He is not ill-appearing.  HENT:     Head: Normocephalic and atraumatic.  Eyes:     Extraocular Movements: Extraocular movements intact.  Cardiovascular:     Rate and Rhythm: Normal rate and regular rhythm.  Pulmonary:     Effort: Pulmonary effort is normal. No respiratory distress.  Abdominal:     General: Abdomen is flat. There is no distension.  Musculoskeletal:        General: Normal range of motion.     Cervical back: Normal range of motion.  Skin:    General: Skin is warm and dry.  Neurological:     General: No focal deficit present.     Mental Status: He is alert and oriented to person, place, and time.     Diagnostic Studies & Laboratory data:    CT Scan:           I have independently reviewed the above radiology studies  and reviewed the findings with the patient.   Recent Lab Findings: Lab Results  Component Value Date   WBC 5.8 03/25/2023   HGB 13.3 03/25/2023   HCT 41.7 03/25/2023   PLT 156 03/25/2023   GLUCOSE 110 (H) 05/13/2023   CHOL 125 03/25/2023   TRIG 95 03/25/2023   HDL 42 03/25/2023   LDLCALC 65 03/25/2023   ALT 15 02/23/2023   AST 20 02/23/2023   NA 140 05/13/2023   K 4.0 05/13/2023   CL 104 05/13/2023   CREATININE 1.02 05/13/2023   BUN 16 05/13/2023   CO2 25 05/13/2023   INR 1.1 02/23/2023   HGBA1C 6.0 (H) 02/23/2023      On brilinta   Assessment / Plan:   78yo male with CAD, and a moderate hiatal hernia.  I think that his shortness of breath may be a combination of mass effect, and silent aspiration given his history of reflux.  He is willing to proceed with a hiatal hernia repair, but this will need to be postponed until April due to his recent PCI and stent placement.  I will see him back in March for further discussion.     I  spent 60 minutes with the patient face to face counseling and coordination of care.    Corliss Skains 06/24/2023 5:52 PM

## 2023-06-24 ENCOUNTER — Encounter: Payer: Self-pay | Admitting: Thoracic Surgery (Cardiothoracic Vascular Surgery)

## 2023-06-24 ENCOUNTER — Institutional Professional Consult (permissible substitution) (INDEPENDENT_AMBULATORY_CARE_PROVIDER_SITE_OTHER): Payer: Medicare Other | Admitting: Thoracic Surgery (Cardiothoracic Vascular Surgery)

## 2023-06-24 VITALS — BP 152/87 | HR 77 | Resp 20 | Ht 69.0 in | Wt 188.2 lb

## 2023-06-24 DIAGNOSIS — K449 Diaphragmatic hernia without obstruction or gangrene: Secondary | ICD-10-CM

## 2023-07-04 ENCOUNTER — Ambulatory Visit (HOSPITAL_COMMUNITY): Payer: Medicare Other

## 2023-07-13 ENCOUNTER — Ambulatory Visit
Admission: RE | Admit: 2023-07-13 | Discharge: 2023-07-13 | Disposition: A | Discharge status: Home / Self Care | Payer: Medicare Other | Source: Ambulatory Visit | Attending: Internal Medicine | Admitting: Internal Medicine

## 2023-07-13 ENCOUNTER — Other Ambulatory Visit: Payer: Self-pay | Admitting: Internal Medicine

## 2023-07-13 DIAGNOSIS — J069 Acute upper respiratory infection, unspecified: Secondary | ICD-10-CM | POA: Diagnosis not present

## 2023-07-13 DIAGNOSIS — R0602 Shortness of breath: Secondary | ICD-10-CM | POA: Diagnosis not present

## 2023-07-13 DIAGNOSIS — I10 Essential (primary) hypertension: Secondary | ICD-10-CM | POA: Diagnosis not present

## 2023-07-13 DIAGNOSIS — I2510 Atherosclerotic heart disease of native coronary artery without angina pectoris: Secondary | ICD-10-CM | POA: Diagnosis not present

## 2023-07-13 DIAGNOSIS — K449 Diaphragmatic hernia without obstruction or gangrene: Secondary | ICD-10-CM | POA: Diagnosis not present

## 2023-07-13 DIAGNOSIS — R052 Subacute cough: Secondary | ICD-10-CM

## 2023-07-22 DIAGNOSIS — R0602 Shortness of breath: Secondary | ICD-10-CM | POA: Diagnosis not present

## 2023-07-22 DIAGNOSIS — R053 Chronic cough: Secondary | ICD-10-CM | POA: Diagnosis not present

## 2023-07-22 DIAGNOSIS — K449 Diaphragmatic hernia without obstruction or gangrene: Secondary | ICD-10-CM | POA: Diagnosis not present

## 2023-07-22 DIAGNOSIS — J479 Bronchiectasis, uncomplicated: Secondary | ICD-10-CM | POA: Diagnosis not present

## 2023-07-22 DIAGNOSIS — Z8701 Personal history of pneumonia (recurrent): Secondary | ICD-10-CM | POA: Diagnosis not present

## 2023-07-22 DIAGNOSIS — R0981 Nasal congestion: Secondary | ICD-10-CM | POA: Diagnosis not present

## 2023-07-22 DIAGNOSIS — I10 Essential (primary) hypertension: Secondary | ICD-10-CM | POA: Diagnosis not present

## 2023-07-22 DIAGNOSIS — I2510 Atherosclerotic heart disease of native coronary artery without angina pectoris: Secondary | ICD-10-CM | POA: Diagnosis not present

## 2023-07-27 ENCOUNTER — Encounter: Payer: Self-pay | Admitting: Pulmonary Disease

## 2023-07-27 ENCOUNTER — Ambulatory Visit (INDEPENDENT_AMBULATORY_CARE_PROVIDER_SITE_OTHER): Payer: Medicare Other | Admitting: Pulmonary Disease

## 2023-07-27 VITALS — BP 134/88 | HR 82 | Ht 69.0 in | Wt 185.2 lb

## 2023-07-27 DIAGNOSIS — K449 Diaphragmatic hernia without obstruction or gangrene: Secondary | ICD-10-CM | POA: Diagnosis not present

## 2023-07-27 DIAGNOSIS — J4520 Mild intermittent asthma, uncomplicated: Secondary | ICD-10-CM

## 2023-07-27 DIAGNOSIS — R0602 Shortness of breath: Secondary | ICD-10-CM | POA: Diagnosis not present

## 2023-07-27 DIAGNOSIS — K219 Gastro-esophageal reflux disease without esophagitis: Secondary | ICD-10-CM | POA: Diagnosis not present

## 2023-07-27 MED ORDER — BUDESONIDE 0.25 MG/2ML IN SUSP
0.2500 mg | Freq: Two times a day (BID) | RESPIRATORY_TRACT | 3 refills | Status: DC
Start: 2023-07-27 — End: 2023-10-25

## 2023-07-27 MED ORDER — BUDESONIDE 0.25 MG/2ML IN SUSP
0.2500 mg | Freq: Two times a day (BID) | RESPIRATORY_TRACT | 3 refills | Status: DC
Start: 2023-07-27 — End: 2023-07-27

## 2023-07-27 NOTE — Patient Instructions (Addendum)
 Continue pantoprazole 40mg  daily in the morning  Take 20mg  famotidine at bedtime  Start budesonide 0.25mg  nebs twice daily and monitor for improvement in your cough. Prescription sent to [facility].  - continue for at least 1 month before stopping  Your pneumonia appears to be clearing up  Follow up in 3 months, call sooner if needed

## 2023-07-27 NOTE — Progress Notes (Unsigned)
 Synopsis: Referred in December 2024 for dyspnea by Yates Decamp, MD  Subjective:   PATIENT ID: Raymond Stanley GENDER: male DOB: Dec 09, 1945, MRN: [id number]  HPI  Chief Complaint  Patient presents with   Acute Visit    Pt states SOB for over a month due to pneumonia believe its control now a lot better, but would like to be looked over due to up coming surgery   Raymond Stanley is a 78 year old male, never smoker with history of hypertension, CAD, GERD and pulmonary emboli who is referred to pulmonary clinic for shortness of breath.   Initial OV 05/12/2023 He was seen by Dr. Jacinto Halim 03/04/23, note reviewed, where he was noted to complain of dyspnea for several years. He reported exposure to asbestos in the past. He was evaluated by Dr. Marchelle Gearing of pulmonary in 2019. HRCT Chest did not indicate findings of ILD but did show air trapping. He had mild diffusion defect on PFTs in 2019. PFTs from 2023 at [facility] clinic are within normal limits. No ERV available for review. Most recent Echo 03/2023 shows LV EF 45-50% (has been 40% in past), Grade I diastolic dysfunction. Normal RV function and mildly enlarged RV. Dilated left and right atria.   The patient, with a history of hiatal hernia and recent heart attack, presents with a chief complaint of progressive shortness of breath over the past four to five years. Initially, the shortness of breath was only noticeable during physical exertion, such as working outside. However, it has progressively worsened to the point where even walking from the bedroom to the kitchen or up a flight of stairs causes significant breathlessness. The patient also reports increased frequency of acid reflux, despite taking omeprazole 40mg  daily, sometimes twice a day if anticipating a late or spicy meal. The patient sleeps with three pillows to alleviate reflux symptoms but still experiences episodes approximately once a week. The patient has seen multiple pulmonologists over the past  five years, with suggested causes for the symptoms ranging from acid reflux damage to the lungs to asbestosis. The patient believes the hiatal hernia is the primary cause of the shortness of breath.  OV 07/27/23 He experienced a respiratory illness towards the end of last month, initially diagnosed as bronchitis and a possible sinus infection at an urgent care visit. He was prescribed doxycycline for ten days, which did not alleviate his symptoms. Subsequently, he self-administered amoxicillin for a week without improvement. Upon visiting his primary care physician, he requested a chest x-ray, which revealed pneumonia based on lateral view. He was treated with a Z-Pak and another antibiotic, leading to improvement within two days.  He has a persistent dry cough, described as nonproductive and lingering. He notes a long-standing dry cough even before the pneumonia diagnosis. No episodes of choking or aspiration, and no fever or chills during the illness. His appetite is fair, but energy levels have been low for a long time. He experiences discomfort in his back along the shoulder blade and on the left side when coughing, but no pain on the right side.   He is preparing for surgery to address a hiatal hernia, which is expected to improve his breathing. He has been using albuterol through a nebulizer three times a day during the pneumonia episode, which provided some relief, especially at night. He has not used it in the last four to five days as he feels the pneumonia is under control. He is currently taking omeprazole 40 mg in the morning  and Pepcid at night, which has effectively managed his heartburn and reflux symptoms.  He mentions taking his grandson to a monster truck event, which he speculates might have contributed to his recent illness. He has a history of attending such events with his son, who has passed away, and now continues the tradition with his grandson.  Past Medical History:  Diagnosis  Date   Actinic keratoses    BPH (benign prostatic hypertrophy)    ED (erectile dysfunction)    Esophageal reflux    Hearing loss    Other and unspecified hyperlipidemia    Premature atrial contractions    Pulmonary emboli (HCC)    Pulmonary emboli (HCC)    Skin cancer    Unspecified essential hypertension      Family History  Problem Relation Age of Onset   Hypertension Mother    CAD Mother    Hypertension Father    CAD Father    Heart disease Brother    Hypertension Brother    Alzheimer's disease Brother    Hypertension Maternal Grandmother    CAD Maternal Grandmother    Asthma Daughter      Social History   Socioeconomic History   Marital status: Married    Spouse name: Not on file   Number of children: Not on file   Years of education: Not on file   Highest education level: Not on file  Occupational History   Not on file  Tobacco Use   Smoking status: Never    Passive exposure: Never   Smokeless tobacco: Never  Vaping Use   Vaping status: Never Used  Substance and Sexual Activity   Alcohol use: Yes    Comment: Rarely   Drug use: No   Sexual activity: Not on file  Other Topics Concern   Not on file  Social History Narrative   Not on file   Social Drivers of Health   Financial Resource Strain: Not on file  Food Insecurity: No Food Insecurity (02/23/2023)   Hunger Vital Sign    Worried About Running Out of Food in the Last Year: Never true    Ran Out of Food in the Last Year: Never true  Transportation Needs: No Transportation Needs (02/23/2023)   PRAPARE - Administrator, Civil Service (Medical): No    Lack of Transportation (Non-Medical): No  Physical Activity: Not on file  Stress: Not on file  Social Connections: Unknown (10/11/2021)   Received [facility]   Social Network    Social Network: Not on file  Intimate Partner Violence: Not At Risk (02/23/2023)   Humiliation, Afraid, Rape, and Kick questionnaire     Fear of Current or Ex-Partner: No    Emotionally Abused: No    Physically Abused: No    Sexually Abused: No     Allergies  Allergen Reactions   Ace Inhibitors    Crestor [Rosuvastatin]    Norvasc [Amlodipine Besylate]    Carac [Fluorouracil] Rash   Lipitor [Atorvastatin] Other (See Comments)    Stated it "tore his shoulders up" with pain     Outpatient Medications Prior to Visit  Medication Sig Dispense Refill   albuterol (VENTOLIN HFA) 108 (90 Base) MCG/ACT inhaler Inhale 2 puffs into the lungs every 6 (six) hours as needed for wheezing or shortness of breath. 8 g 2   amLODipine (NORVASC) 10 MG tablet Take 1 tablet (10 mg total) by mouth daily. (Patient taking differently: Take 5 mg by  mouth daily. Patient is taking 5mg ) 90 tablet 3   aspirin 81 MG tablet Take 81 mg by mouth daily.     Cholecalciferol (VITAMIN D3) 50 MCG (2000 UT) capsule Take 2,000 Units by mouth daily.     co-enzyme Q-10 30 MG capsule Take 30 mg by mouth 3 (three) times daily.     Cyanocobalamin (VITAMIN B12) 1000 MCG TBCR Take 1 capsule by mouth daily.     cyclobenzaprine (FLEXERIL) 10 MG tablet Take 10 mg by mouth 3 (three) times daily as needed for muscle spasms.     diclofenac sodium (VOLTAREN) 1 % GEL Apply 2-4 g topically 4 (four) times daily. (Patient taking differently: Apply 2-4 g topically daily as needed (artheritis).) 15 Tube 4   ezetimibe (ZETIA) 10 MG tablet Take 1 tablet (10 mg total) by mouth daily. 90 tablet 3   famotidine (PEPCID) 20 MG tablet Take 1 tablet (20 mg total) by mouth at bedtime. 90 tablet 1   HYDROcodone bit-homatropine (HYCODAN) 5-1.5 MG/5ML syrup Take 2.5 mLs by mouth at bedtime as needed for cough. 100 mL 0   losartan-hydrochlorothiazide (HYZAAR) 100-12.5 MG tablet Take 0.5 tablets by mouth in the morning.     metoprolol succinate (TOPROL-XL) 25 MG 24 hr tablet Take 1 tablet (25 mg total) by mouth daily. 90 tablet 3   nitroGLYCERIN (NITROSTAT) 0.4 MG SL tablet Place 1 tablet (0.4 mg  total) under the tongue every 5 (five) minutes as needed for chest pain. 25 tablet 1   omeprazole (PRILOSEC) 40 MG capsule Take 40 mg by mouth daily.     simvastatin (ZOCOR) 40 MG tablet Take 1 tablet (40 mg total) by mouth daily. 90 tablet 3   temazepam (RESTORIL) 15 MG capsule Take 15 mg by mouth at bedtime as needed for sleep.     temazepam (RESTORIL) 15 MG capsule Take 15 mg by mouth daily at 6 (six) AM.     terazosin (HYTRIN) 5 MG capsule Take 5 mg by mouth at bedtime.     ticagrelor (BRILINTA) 90 MG TABS tablet Take 1 tablet (90 mg total) by mouth 2 (two) times daily. 180 tablet 3   triamcinolone ointment (KENALOG) 0.5 % Apply 1 application topically 2 (two) times daily. 30 g 0   doxycycline (VIBRAMYCIN) 100 MG capsule Take 1 capsule (100 mg total) by mouth 2 (two) times daily. 20 capsule 0   famotidine (PEPCID) 20 MG tablet Take 1 tablet (20 mg total) by mouth 2 (two) times daily. 90 tablet 0   No facility-administered medications prior to visit.    Review of Systems  Constitutional:  Negative for chills, fever, malaise/fatigue and weight loss.  HENT:  Negative for congestion, sinus pain and sore throat.   Eyes: Negative.   Respiratory:  Positive for cough and shortness of breath. Negative for hemoptysis, sputum production and wheezing.   Cardiovascular:  Negative for chest pain, palpitations, orthopnea, claudication and leg swelling.  Gastrointestinal:  Negative for abdominal pain, heartburn, nausea and vomiting.  Genitourinary: Negative.   Musculoskeletal:  Negative for joint pain and myalgias.  Skin:  Negative for rash.  Neurological:  Negative for weakness.  Endo/Heme/Allergies: Negative.   Psychiatric/Behavioral: Negative.     Objective:   Vitals:   07/27/23 1305  BP: 134/88  Pulse: 82  SpO2: 97%  Weight: 185 lb 3.2 oz (84 kg)  Height: 5\' 9"  (1.753 m)    Physical Exam Constitutional:      General: He is not in acute distress.  Appearance: Normal appearance.   Eyes:     General: No scleral icterus.    Conjunctiva/sclera: Conjunctivae normal.  Cardiovascular:     Rate and Rhythm: Normal rate and regular rhythm.  Pulmonary:     Breath sounds: No wheezing, rhonchi or rales.  Musculoskeletal:     Right lower leg: No edema.     Left lower leg: No edema.  Skin:    General: Skin is warm and dry.  Neurological:     General: No focal deficit present.     CBC    Component Value Date/Time   WBC 5.8 03/25/2023 0912   WBC 7.9 02/24/2023 0252   RBC 4.46 03/25/2023 0912   RBC 4.36 02/24/2023 0252   HGB 13.3 03/25/2023 0912   HCT 41.7 03/25/2023 0912   PLT 156 03/25/2023 0912   MCV 94 03/25/2023 0912   MCH 29.8 03/25/2023 0912   MCH 29.8 02/24/2023 0252   MCHC 31.9 03/25/2023 0912   MCHC 32.7 02/24/2023 0252   RDW 13.2 03/25/2023 0912   LYMPHSABS 2.4 02/23/2023 1145   MONOABS 0.5 02/23/2023 1145   EOSABS 0.1 02/23/2023 1145   BASOSABS 0.0 02/23/2023 1145      Latest Ref Rng & Units 05/13/2023   10:22 AM 03/25/2023    9:12 AM 02/24/2023    2:52 AM  BMP  Glucose 70 - 99 mg/dL 161  096  045   BUN 8 - 27 mg/dL 16  18  14    Creatinine 0.76 - 1.27 mg/dL 4.09  8.11  9.14   BUN/Creat Ratio 10 - 24 16  12     Sodium 134 - 144 mmol/L 140  141  135   Potassium 3.5 - 5.2 mmol/L 4.0  3.9  3.9   Chloride 96 - 106 mmol/L 104  105  105   CO2 20 - 29 mmol/L 25  23  24    Calcium 8.6 - 10.2 mg/dL 9.8  9.8  8.8    Chest imaging: CXR 07/13/23 1. Density overlying the posterior lower lobes on the lateral view is not clearly located on the PA view. This could reflect atelectasis or pneumonia.  CT Chest 09/29/20 Mediastinum/Nodes: Thoracic inlet is within normal limits. No sizable hilar or mediastinal adenopathy is noted. The esophagus as visualized is within normal limits. A moderate-sized hiatal hernia is noted however.   Lungs/Pleura: Lungs are well aerated bilaterally. Mild right basilar atelectasis is seen. No focal confluent infiltrate is  noted. No sizable parenchymal nodule is seen. No effusions are seen.  PFT:    Latest Ref Rng & Units 02/15/2018   10:56 AM  PFT Results  FVC-Pre L 3.20   FVC-Predicted Pre % 76   FVC-Post L 3.06   FVC-Predicted Post % 73   Pre FEV1/FVC % % 76   Post FEV1/FCV % % 82   FEV1-Pre L 2.43   FEV1-Predicted Pre % 79   FEV1-Post L 2.50   DLCO uncorrected ml/min/mmHg 20.80   DLCO UNC% % 67   DLVA Predicted % 90   TLC L 6.08   TLC % Predicted % 89   RV % Predicted % 110    2023 [facility] SPIROMETRY: FVC was 3.22 liters, 83% of predicted FEV1 was 2.50, 86% of predicted FEV1 ratio was 77.61 FEF 25-75% liters per second was 102% of predicted  LUNG VOLUMES: TLC was 84% of predicted RV was 99% of predicted  DIFFUSION CAPACITY: DLCO was 97% of predicted DLCO/VA was 105% of predicted  Labs:  Path:  Echo 03/2023:  1. Left ventricular ejection fraction, by estimation, is 45 to 50%. The  left ventricle has mildly decreased function. The left ventricle  demonstrates regional wall motion abnormalities with inferoseptal and  inferior hypokinesis. Left ventricular  diastolic parameters are consistent with Grade I diastolic dysfunction  (impaired relaxation).   2. Right ventricular systolic function is normal. The right ventricular  size is mildly enlarged. There is normal pulmonary artery systolic  pressure. The estimated right ventricular systolic pressure is 27.6 mmHg.   3. Left atrial size was mild to moderately dilated.   4. Right atrial size was mildly dilated.   5. The mitral valve is normal in structure. Trivial mitral valve  regurgitation. No evidence of mitral stenosis.   6. The aortic valve is tricuspid. There is moderate calcification of the  aortic valve. Aortic valve regurgitation is not visualized. Aortic valve  sclerosis/calcification is present, without any evidence of aortic  stenosis.   7. The inferior vena cava is normal in size with greater than 50%   respiratory variability, suggesting right atrial pressure of 3 mmHg.   Heart Catheterization 01/2023: LVEDP 23 mmHg      Prox LAD to Mid LAD lesion is 50% stenosed.   Mid LAD lesion is 60% stenosed.   1st Sept lesion is 50% stenosed.   Ost Cx to Prox Cx lesion is 30% stenosed.   Mid Cx lesion is 40% stenosed.   3rd Mrg lesion is 40% stenosed.   RPDA-2 lesion is 100% stenosed.   Dist RCA lesion is 30% stenosed.   RPDA-1 lesion is 30% stenosed.   Mid RCA lesion is 85% stenosed.   Prox RCA to Mid RCA lesion is 75% stenosed.   Ost RCA lesion is 20% stenosed.   A drug-eluting stent was successfully placed.   Post intervention, there is a 80% residual stenosis.   Post intervention, there is a 0% residual stenosis.   Post intervention, there is a 0% residual stenosis.   Recommend uninterrupted dual antiplatelet therapy with Aspirin 81mg  daily and Ticagrelor 90mg  twice daily for a minimum of 12 months (ACS-Class I recommendation). Assessment & Plan:   Mild intermittent reactive airway disease without complication - Plan: budesonide (PULMICORT) 0.25 MG/2ML nebulizer solution, DISCONTINUED: budesonide (PULMICORT) 0.25 MG/2ML nebulizer solution  Shortness of breath  Gastroesophageal reflux disease without esophagitis  Hiatal hernia  Discussion: Raymond Stanley is a 78 year old male, never smoker with history of hypertension, CAD, GERD and pulmonary emboli who is referred to pulmonary clinic for shortness of breath.   Recent Pneumonia Reactive Airways Disease Patient has completed a course of antibiotics (Z-Pak and another unspecified antibiotic) and reports improvement in symptoms. However, a lingering dry cough remains. -Prescribe Budesonide nebulizer twice daily for one month to reduce inflammation. -Continue Albuterol nebulizer as needed. -Check in after one month to assess effectiveness of Budesonide.  Hiatal Hernia Patient is scheduled to meet with Dr. Cliffton Asters on 08/26/2023 to  schedule surgery. The patient has been advised to be in the best possible health for the surgery, with minimal coughing and no heavy lifting for six weeks post-surgery.  Gastroesophageal Reflux Disease (GERD) Patient reports no current heartburn or severe reflux symptoms. Currently on Omeprazole 40mg  in the morning and Pepcid at night. -Continue current medication regimen.  Post Myocardial Infarction Diastolic Heart Failure Pulmonary Hypertension, LVEDP - due to increased pulmonary venous pressure Recent heart attack with stent placement. Mild reduction in heart function and impaired relaxation  of the heart, possibly contributing to dyspnea.  -Continue current cardiac medications as prescribed by Dr. Jacinto Halim. -Consider cardiopulmonary exercise test to assess heart and lung function during activity.  Follow-up in three months  Melody Comas, MD [facility] Office: [phone]   Current Outpatient Medications:    albuterol (VENTOLIN HFA) 108 (90 Base) MCG/ACT inhaler, Inhale 2 puffs into the lungs every 6 (six) hours as needed for wheezing or shortness of breath., Disp: 8 g, Rfl: 2   amLODipine (NORVASC) 10 MG tablet, Take 1 tablet (10 mg total) by mouth daily. (Patient taking differently: Take 5 mg by mouth daily. Patient is taking 5mg ), Disp: 90 tablet, Rfl: 3   aspirin 81 MG tablet, Take 81 mg by mouth daily., Disp: , Rfl:    budesonide (PULMICORT) 0.25 MG/2ML nebulizer solution, Take 2 mLs (0.25 mg total) by nebulization in the morning and at bedtime., Disp: 360 mL, Rfl: 3   Cholecalciferol (VITAMIN D3) 50 MCG (2000 UT) capsule, Take 2,000 Units by mouth daily., Disp: , Rfl:    co-enzyme Q-10 30 MG capsule, Take 30 mg by mouth 3 (three) times daily., Disp: , Rfl:    Cyanocobalamin (VITAMIN B12) 1000 MCG TBCR, Take 1 capsule by mouth daily., Disp: , Rfl:    cyclobenzaprine (FLEXERIL) 10 MG tablet, Take 10 mg by mouth 3 (three) times daily as needed for  muscle spasms., Disp: , Rfl:    diclofenac sodium (VOLTAREN) 1 % GEL, Apply 2-4 g topically 4 (four) times daily. (Patient taking differently: Apply 2-4 g topically daily as needed (artheritis).), Disp: 15 Tube, Rfl: 4   ezetimibe (ZETIA) 10 MG tablet, Take 1 tablet (10 mg total) by mouth daily., Disp: 90 tablet, Rfl: 3   famotidine (PEPCID) 20 MG tablet, Take 1 tablet (20 mg total) by mouth at bedtime., Disp: 90 tablet, Rfl: 1   HYDROcodone bit-homatropine (HYCODAN) 5-1.5 MG/5ML syrup, Take 2.5 mLs by mouth at bedtime as needed for cough., Disp: 100 mL, Rfl: 0   losartan-hydrochlorothiazide (HYZAAR) 100-12.5 MG tablet, Take 0.5 tablets by mouth in the morning., Disp: , Rfl:    metoprolol succinate (TOPROL-XL) 25 MG 24 hr tablet, Take 1 tablet (25 mg total) by mouth daily., Disp: 90 tablet, Rfl: 3   nitroGLYCERIN (NITROSTAT) 0.4 MG SL tablet, Place 1 tablet (0.4 mg total) under the tongue every 5 (five) minutes as needed for chest pain., Disp: 25 tablet, Rfl: 1   omeprazole (PRILOSEC) 40 MG capsule, Take 40 mg by mouth daily., Disp: , Rfl:    simvastatin (ZOCOR) 40 MG tablet, Take 1 tablet (40 mg total) by mouth daily., Disp: 90 tablet, Rfl: 3   temazepam (RESTORIL) 15 MG capsule, Take 15 mg by mouth at bedtime as needed for sleep., Disp: , Rfl:    temazepam (RESTORIL) 15 MG capsule, Take 15 mg by mouth daily at 6 (six) AM., Disp: , Rfl:    terazosin (HYTRIN) 5 MG capsule, Take 5 mg by mouth at bedtime., Disp: , Rfl:    ticagrelor (BRILINTA) 90 MG TABS tablet, Take 1 tablet (90 mg total) by mouth 2 (two) times daily., Disp: 180 tablet, Rfl: 3   triamcinolone ointment (KENALOG) 0.5 %, Apply 1 application topically 2 (two) times daily., Disp: 30 g, Rfl: 0

## 2023-07-29 ENCOUNTER — Encounter: Payer: Self-pay | Admitting: Pulmonary Disease

## 2023-08-03 ENCOUNTER — Encounter: Payer: Self-pay | Admitting: Pulmonary Disease

## 2023-08-03 NOTE — Telephone Encounter (Signed)
 Patient already picked up this medication per pharmacy.

## 2023-08-26 ENCOUNTER — Other Ambulatory Visit: Payer: Self-pay | Admitting: *Deleted

## 2023-08-26 ENCOUNTER — Ambulatory Visit: Payer: Medicare Other | Admitting: Thoracic Surgery (Cardiothoracic Vascular Surgery)

## 2023-08-26 ENCOUNTER — Encounter: Payer: Self-pay | Admitting: Thoracic Surgery (Cardiothoracic Vascular Surgery)

## 2023-08-26 ENCOUNTER — Encounter: Payer: Self-pay | Admitting: *Deleted

## 2023-08-26 VITALS — BP 128/77 | HR 107 | Resp 20 | Ht 69.0 in | Wt 185.0 lb

## 2023-08-26 DIAGNOSIS — K449 Diaphragmatic hernia without obstruction or gangrene: Secondary | ICD-10-CM

## 2023-08-26 DIAGNOSIS — Z5181 Encounter for therapeutic drug level monitoring: Secondary | ICD-10-CM

## 2023-08-26 NOTE — H&P (View-Only) (Signed)
 [address]       [place] [address]             [id number]                                                   Raymond Stanley [facility] Medical Record #[id number] Date of Birth: 01/04/1946   Referring: Martina Sinner, MD Primary Care: Emilio Aspen, MD Primary Cardiologist: Yates Decamp, MD   Chief Complaint:        Chief Complaint  Patient presents with   Hiatal Hernia      CT chest 12/31      History of Present Illness:    Raymond Stanley 78 y.o. male presents for surgical evaluation of a hiatal hernia.  Earlier this fall, he was treated with PCI for CAD, but has had persistent exertional shortness of breath.  He has been evaluated by cardiology and pulmonary and it is felt that his ongoing shortness of breath is related to his hiatal hernia.     Of note, he has had progressive reflux that has been refractory to medical therapy.  He has also had recurrent upper respiratory infections.  He denies any dysphagia or odynophagia.   Since his last appointment he has been treated for pneumonia.  He continues to have dry cough.       Past Medical History:  Diagnosis Date   Actinic keratoses     BPH (benign prostatic hypertrophy)     ED (erectile dysfunction)     Esophageal reflux     Hearing loss     Other and unspecified hyperlipidemia     Premature atrial contractions     Pulmonary emboli (HCC)     Pulmonary emboli (HCC)     Skin cancer     Unspecified essential hypertension                 Past Surgical History:  Procedure Laterality Date   CORONARY/GRAFT ACUTE MI REVASCULARIZATION N/A 02/23/2023    Procedure: Coronary/Graft Acute MI Revascularization;  Surgeon: Lennette Bihari, MD;  Location: [facility];  Service: Cardiovascular;  Laterality: N/A;   EXCISION MASS NECK Right 05/08/2021    Procedure: EXCISION RIGHT SUPERFICIAL NECK MASS UNDER LOCAL ANESTHESIA;  Surgeon: Linus Salmons, MD;  Location: [facility];  Service:  ENT;  Laterality: Right;   INGUINAL HERNIA REPAIR Right                 Family History  Problem Relation Age of Onset   Hypertension Mother     CAD Mother     Hypertension Father     CAD Father     Heart disease Brother     Hypertension Brother     Alzheimer's disease Brother     Hypertension Maternal Grandmother     CAD Maternal Grandmother     Asthma Daughter              Tobacco Use History  Social History        Tobacco Use  Smoking Status Never   Passive exposure: Never  Smokeless Tobacco Never      Social History        Substance and Sexual Activity  Alcohol Use Yes    Comment: Rarely  Allergies       Allergies  Allergen Reactions   Ace Inhibitors     Crestor [Rosuvastatin]     Norvasc [Amlodipine Besylate]     Carac [Fluorouracil] Rash   Lipitor [Atorvastatin] Other (See Comments)      Stated it "tore his shoulders up" with pain              Current Outpatient Medications  Medication Sig Dispense Refill   albuterol (VENTOLIN HFA) 108 (90 Base) MCG/ACT inhaler Inhale 2 puffs into the lungs every 6 (six) hours as needed for wheezing or shortness of breath. 8 g 2   amLODipine (NORVASC) 10 MG tablet Take 1 tablet (10 mg total) by mouth daily. (Patient taking differently: Take 5 mg by mouth daily. Patient is taking 5mg ) 90 tablet 3   aspirin 81 MG tablet Take 81 mg by mouth daily.       Cholecalciferol (VITAMIN D3) 50 MCG (2000 UT) capsule Take 2,000 Units by mouth daily.       co-enzyme Q-10 30 MG capsule Take 30 mg by mouth 3 (three) times daily.       Cyanocobalamin (VITAMIN B12) 1000 MCG TBCR Take 1 capsule by mouth daily.       cyclobenzaprine (FLEXERIL) 10 MG tablet Take 10 mg by mouth 3 (three) times daily as needed for muscle spasms.       diclofenac sodium (VOLTAREN) 1 % GEL Apply 2-4 g topically 4 (four) times daily. (Patient taking differently: Apply 2-4 g topically daily as needed (artheritis).) 15 Tube 4   doxycycline  (VIBRAMYCIN) 100 MG capsule Take 1 capsule (100 mg total) by mouth 2 (two) times daily. 20 capsule 0   ezetimibe (ZETIA) 10 MG tablet Take 1 tablet (10 mg total) by mouth daily. 90 tablet 3   famotidine (PEPCID) 20 MG tablet Take 1 tablet (20 mg total) by mouth 2 (two) times daily. 90 tablet 0   famotidine (PEPCID) 20 MG tablet Take 1 tablet (20 mg total) by mouth at bedtime. 90 tablet 1   HYDROcodone bit-homatropine (HYCODAN) 5-1.5 MG/5ML syrup Take 2.5 mLs by mouth at bedtime as needed for cough. 100 mL 0   losartan-hydrochlorothiazide (HYZAAR) 100-12.5 MG tablet Take 0.5 tablets by mouth in the morning.       metoprolol succinate (TOPROL-XL) 25 MG 24 hr tablet Take 1 tablet (25 mg total) by mouth daily. 90 tablet 3   nitroGLYCERIN (NITROSTAT) 0.4 MG SL tablet Place 1 tablet (0.4 mg total) under the tongue every 5 (five) minutes as needed for chest pain. 25 tablet 1   omeprazole (PRILOSEC) 40 MG capsule Take 40 mg by mouth daily.       predniSONE (DELTASONE) 20 MG tablet Take 1 tablet (20 mg total) by mouth daily with breakfast for 5 days. 5 tablet 0   simvastatin (ZOCOR) 40 MG tablet Take 1 tablet (40 mg total) by mouth daily. 90 tablet 3   temazepam (RESTORIL) 15 MG capsule Take 15 mg by mouth at bedtime as needed for sleep.       temazepam (RESTORIL) 15 MG capsule Take 15 mg by mouth daily at 6 (six) AM.       terazosin (HYTRIN) 5 MG capsule Take 5 mg by mouth at bedtime.       ticagrelor (BRILINTA) 90 MG TABS tablet Take 1 tablet (90 mg total) by mouth 2 (two) times daily. 180 tablet 3   triamcinolone ointment (KENALOG) 0.5 % Apply 1 application topically 2 (  two) times daily. 30 g 0      No current facility-administered medications for this visit.        Review of Systems  Constitutional:  Negative for malaise/fatigue.  Respiratory:  Positive for cough and shortness of breath.   Cardiovascular:  Negative for chest pain.  Gastrointestinal:  Positive for heartburn. Negative for  abdominal pain and vomiting.  Neurological: Negative.         PHYSICAL EXAMINATION: BP (!) 152/87 (BP Location: Left Arm, Patient Position: Sitting, Cuff Size: Normal)   Pulse 77   Resp 20   Ht 5\' 9"  (1.753 m)   Wt 188 lb 3.2 oz (85.4 kg)   SpO2 98% Comment: RA  BMI 27.79 kg/m  Physical Exam Constitutional:      General: He is not in acute distress.    Appearance: He is not ill-appearing.  HENT:     Head: Normocephalic and atraumatic.  Eyes:     Extraocular Movements: Extraocular movements intact.  Cardiovascular:     Rate and Rhythm: Normal rate and regular rhythm.  Pulmonary:     Effort: Pulmonary effort is normal. No respiratory distress.  Abdominal:     General: Abdomen is flat. There is no distension.  Musculoskeletal:        General: Normal range of motion.     Cervical back: Normal range of motion.  Skin:    General: Skin is warm and dry.  Neurological:     General: No focal deficit present.     Mental Status: He is alert and oriented to person, place, and time.        Diagnostic Studies & Laboratory data:    CT Scan:                 I have independently reviewed the above radiology studies  and reviewed the findings with the patient.    Recent Lab Findings: Recent Labs       Lab Results  Component Value Date    WBC 5.8 03/25/2023    HGB 13.3 03/25/2023    HCT 41.7 03/25/2023    PLT 156 03/25/2023    GLUCOSE 110 (H) 05/13/2023    CHOL 125 03/25/2023    TRIG 95 03/25/2023    HDL 42 03/25/2023    LDLCALC 65 03/25/2023    ALT 15 02/23/2023    AST 20 02/23/2023    NA 140 05/13/2023    K 4.0 05/13/2023    CL 104 05/13/2023    CREATININE 1.02 05/13/2023    BUN 16 05/13/2023    CO2 25 05/13/2023    INR 1.1 02/23/2023    HGBA1C 6.0 (H) 02/23/2023            On brilinta     Assessment / Plan:   78yo male with CAD, and a moderate hiatal hernia.  I think that his shortness of breath may be a combination of mass effect, and silent  aspiration given his history of reflux.  He is willing to proceed with a hiatal hernia repair.  Will hold his Brilinta.     I  spent 60 minutes with the patient face to face counseling and coordination of care.     Corliss Skains 06/24/2023 5:52 PM

## 2023-08-26 NOTE — Progress Notes (Signed)
 [address]       [place] [address]             [id number]                                                   TANNEN VANDEZANDE [facility] Medical Record #[id number] Date of Birth: 01/04/1946   Referring: Martina Sinner, MD Primary Care: Emilio Aspen, MD Primary Cardiologist: Yates Decamp, MD   Chief Complaint:        Chief Complaint  Patient presents with   Hiatal Hernia      CT chest 12/31      History of Present Illness:    Raymond Stanley 78 y.o. male presents for surgical evaluation of a hiatal hernia.  Earlier this fall, he was treated with PCI for CAD, but has had persistent exertional shortness of breath.  He has been evaluated by cardiology and pulmonary and it is felt that his ongoing shortness of breath is related to his hiatal hernia.     Of note, he has had progressive reflux that has been refractory to medical therapy.  He has also had recurrent upper respiratory infections.  He denies any dysphagia or odynophagia.   Since his last appointment he has been treated for pneumonia.  He continues to have dry cough.       Past Medical History:  Diagnosis Date   Actinic keratoses     BPH (benign prostatic hypertrophy)     ED (erectile dysfunction)     Esophageal reflux     Hearing loss     Other and unspecified hyperlipidemia     Premature atrial contractions     Pulmonary emboli (HCC)     Pulmonary emboli (HCC)     Skin cancer     Unspecified essential hypertension                 Past Surgical History:  Procedure Laterality Date   CORONARY/GRAFT ACUTE MI REVASCULARIZATION N/A 02/23/2023    Procedure: Coronary/Graft Acute MI Revascularization;  Surgeon: Lennette Bihari, MD;  Location: [facility];  Service: Cardiovascular;  Laterality: N/A;   EXCISION MASS NECK Right 05/08/2021    Procedure: EXCISION RIGHT SUPERFICIAL NECK MASS UNDER LOCAL ANESTHESIA;  Surgeon: Linus Salmons, MD;  Location: [facility];  Service:  ENT;  Laterality: Right;   INGUINAL HERNIA REPAIR Right                 Family History  Problem Relation Age of Onset   Hypertension Mother     CAD Mother     Hypertension Father     CAD Father     Heart disease Brother     Hypertension Brother     Alzheimer's disease Brother     Hypertension Maternal Grandmother     CAD Maternal Grandmother     Asthma Daughter              Tobacco Use History  Social History        Tobacco Use  Smoking Status Never   Passive exposure: Never  Smokeless Tobacco Never      Social History        Substance and Sexual Activity  Alcohol Use Yes    Comment: Rarely  Allergies       Allergies  Allergen Reactions   Ace Inhibitors     Crestor [Rosuvastatin]     Norvasc [Amlodipine Besylate]     Carac [Fluorouracil] Rash   Lipitor [Atorvastatin] Other (See Comments)      Stated it "tore his shoulders up" with pain              Current Outpatient Medications  Medication Sig Dispense Refill   albuterol (VENTOLIN HFA) 108 (90 Base) MCG/ACT inhaler Inhale 2 puffs into the lungs every 6 (six) hours as needed for wheezing or shortness of breath. 8 g 2   amLODipine (NORVASC) 10 MG tablet Take 1 tablet (10 mg total) by mouth daily. (Patient taking differently: Take 5 mg by mouth daily. Patient is taking 5mg ) 90 tablet 3   aspirin 81 MG tablet Take 81 mg by mouth daily.       Cholecalciferol (VITAMIN D3) 50 MCG (2000 UT) capsule Take 2,000 Units by mouth daily.       co-enzyme Q-10 30 MG capsule Take 30 mg by mouth 3 (three) times daily.       Cyanocobalamin (VITAMIN B12) 1000 MCG TBCR Take 1 capsule by mouth daily.       cyclobenzaprine (FLEXERIL) 10 MG tablet Take 10 mg by mouth 3 (three) times daily as needed for muscle spasms.       diclofenac sodium (VOLTAREN) 1 % GEL Apply 2-4 g topically 4 (four) times daily. (Patient taking differently: Apply 2-4 g topically daily as needed (artheritis).) 15 Tube 4   doxycycline  (VIBRAMYCIN) 100 MG capsule Take 1 capsule (100 mg total) by mouth 2 (two) times daily. 20 capsule 0   ezetimibe (ZETIA) 10 MG tablet Take 1 tablet (10 mg total) by mouth daily. 90 tablet 3   famotidine (PEPCID) 20 MG tablet Take 1 tablet (20 mg total) by mouth 2 (two) times daily. 90 tablet 0   famotidine (PEPCID) 20 MG tablet Take 1 tablet (20 mg total) by mouth at bedtime. 90 tablet 1   HYDROcodone bit-homatropine (HYCODAN) 5-1.5 MG/5ML syrup Take 2.5 mLs by mouth at bedtime as needed for cough. 100 mL 0   losartan-hydrochlorothiazide (HYZAAR) 100-12.5 MG tablet Take 0.5 tablets by mouth in the morning.       metoprolol succinate (TOPROL-XL) 25 MG 24 hr tablet Take 1 tablet (25 mg total) by mouth daily. 90 tablet 3   nitroGLYCERIN (NITROSTAT) 0.4 MG SL tablet Place 1 tablet (0.4 mg total) under the tongue every 5 (five) minutes as needed for chest pain. 25 tablet 1   omeprazole (PRILOSEC) 40 MG capsule Take 40 mg by mouth daily.       predniSONE (DELTASONE) 20 MG tablet Take 1 tablet (20 mg total) by mouth daily with breakfast for 5 days. 5 tablet 0   simvastatin (ZOCOR) 40 MG tablet Take 1 tablet (40 mg total) by mouth daily. 90 tablet 3   temazepam (RESTORIL) 15 MG capsule Take 15 mg by mouth at bedtime as needed for sleep.       temazepam (RESTORIL) 15 MG capsule Take 15 mg by mouth daily at 6 (six) AM.       terazosin (HYTRIN) 5 MG capsule Take 5 mg by mouth at bedtime.       ticagrelor (BRILINTA) 90 MG TABS tablet Take 1 tablet (90 mg total) by mouth 2 (two) times daily. 180 tablet 3   triamcinolone ointment (KENALOG) 0.5 % Apply 1 application topically 2 (  two) times daily. 30 g 0      No current facility-administered medications for this visit.        Review of Systems  Constitutional:  Negative for malaise/fatigue.  Respiratory:  Positive for cough and shortness of breath.   Cardiovascular:  Negative for chest pain.  Gastrointestinal:  Positive for heartburn. Negative for  abdominal pain and vomiting.  Neurological: Negative.         PHYSICAL EXAMINATION: BP (!) 152/87 (BP Location: Left Arm, Patient Position: Sitting, Cuff Size: Normal)   Pulse 77   Resp 20   Ht 5\' 9"  (1.753 m)   Wt 188 lb 3.2 oz (85.4 kg)   SpO2 98% Comment: RA  BMI 27.79 kg/m  Physical Exam Constitutional:      General: He is not in acute distress.    Appearance: He is not ill-appearing.  HENT:     Head: Normocephalic and atraumatic.  Eyes:     Extraocular Movements: Extraocular movements intact.  Cardiovascular:     Rate and Rhythm: Normal rate and regular rhythm.  Pulmonary:     Effort: Pulmonary effort is normal. No respiratory distress.  Abdominal:     General: Abdomen is flat. There is no distension.  Musculoskeletal:        General: Normal range of motion.     Cervical back: Normal range of motion.  Skin:    General: Skin is warm and dry.  Neurological:     General: No focal deficit present.     Mental Status: He is alert and oriented to person, place, and time.        Diagnostic Studies & Laboratory data:    CT Scan:                 I have independently reviewed the above radiology studies  and reviewed the findings with the patient.    Recent Lab Findings: Recent Labs       Lab Results  Component Value Date    WBC 5.8 03/25/2023    HGB 13.3 03/25/2023    HCT 41.7 03/25/2023    PLT 156 03/25/2023    GLUCOSE 110 (H) 05/13/2023    CHOL 125 03/25/2023    TRIG 95 03/25/2023    HDL 42 03/25/2023    LDLCALC 65 03/25/2023    ALT 15 02/23/2023    AST 20 02/23/2023    NA 140 05/13/2023    K 4.0 05/13/2023    CL 104 05/13/2023    CREATININE 1.02 05/13/2023    BUN 16 05/13/2023    CO2 25 05/13/2023    INR 1.1 02/23/2023    HGBA1C 6.0 (H) 02/23/2023            On brilinta     Assessment / Plan:   78yo male with CAD, and a moderate hiatal hernia.  I think that his shortness of breath may be a combination of mass effect, and silent  aspiration given his history of reflux.  He is willing to proceed with a hiatal hernia repair.  Will hold his Brilinta.     I  spent 60 minutes with the patient face to face counseling and coordination of care.     Corliss Skains 06/24/2023 5:52 PM

## 2023-08-31 ENCOUNTER — Encounter: Payer: Self-pay | Admitting: Pulmonary Disease

## 2023-08-31 NOTE — Progress Notes (Signed)
 Surgical Instructions   Your procedure is scheduled on Monday, April 7, 25.  Report to [facility] Main Entrance "A" at 5:30 A.M., then check in with the Admitting office. Any questions or running late day of surgery: call [phone]  Questions prior to your surgery date: call [phone], Monday-Friday, 8am-4pm. If you experience any cold or flu symptoms such as cough, fever, chills, shortness of breath, etc. between now and your scheduled surgery, please notify us at the above number.     Remember:  Do not eat or drink after midnight the night before your surgery   Take these medicines the morning of surgery with A SIP OF WATER  amLODipine (NORVASC)  budesonide (PULMICORT)  ezetimibe (ZETIA)  metoprolol succinate (TOPROL-XL)  omeprazole (PRILOSEC)  simvastatin (ZOCOR)    May take these medicines IF NEEDED: albuterol (PROVENTIL) nebulizer nitroGLYCERIN (NITROSTAT)    Per your cardiologist's instruction, hold your ticagrelor (Brilinta) for 5 days prior to your surgery.  Your last dose should be Tuesday, April 1st.   Per your cardiologist's instruction, do not take your aspirin the morning of surgery.  Your last dose of Aspirin should be on Sunday, April 6th.   One week prior to surgery, STOP taking any Aleve, Naproxen, Ibuprofen, Motrin, Advil, Goody's, BC's, all herbal medications, fish oil, and non-prescription vitamins. This includes your diclofenac sodium (VOLTAREN) gel.                      Do NOT Smoke (Tobacco/Vaping) for 24 hours prior to your procedure.  If you use a CPAP at night, you may bring your mask/headgear for your overnight stay.   You will be asked to remove any contacts, glasses, piercing's, hearing aid's, dentures/partials prior to surgery. Please bring cases for these items if needed.    Patients discharged the day of surgery will not be allowed to drive home, and someone needs to stay with them for 24 hours.  SURGICAL WAITING ROOM  VISITATION Patients may have no more than 2 support people in the waiting area - these visitors may rotate.   Pre-op nurse will coordinate an appropriate time for 1 ADULT support person, who may not rotate, to accompany patient in pre-op.  Children under the age of 65 must have an adult with them who is not the patient and must remain in the main waiting area with an adult.  If the patient needs to stay at the hospital during part of their recovery, the visitor guidelines for inpatient rooms apply.  Please refer to the [facility] website for the visitor guidelines for any additional information.   If you received a COVID test during your pre-op visit  it is requested that you wear a mask when out in public, stay away from anyone that may not be feeling well and notify your surgeon if you develop symptoms. If you have been in contact with anyone that has tested positive in the last 10 days please notify you surgeon.      Pre-operative CHG Bathing Instructions   You can play a key role in reducing the risk of infection after surgery. Your skin needs to be as free of germs as possible. You can reduce the number of germs on your skin by washing with CHG (chlorhexidine gluconate) soap before surgery. CHG is an antiseptic soap that kills germs and continues to kill germs even after washing.   DO NOT use if you have an allergy to chlorhexidine/CHG or antibacterial soaps. If your  skin becomes reddened or irritated, stop using the CHG and notify one of our RNs at [phone].              TAKE A SHOWER THE NIGHT BEFORE SURGERY AND THE DAY OF SURGERY    Please keep in mind the following:  DO NOT shave, including legs and underarms, 48 hours prior to surgery.   You may shave your face before/day of surgery.  Place clean sheets on your bed the night before surgery Use a clean washcloth (not used since being washed) for each shower. DO NOT sleep with pet's night before surgery.  CHG Shower  Instructions:  Wash your face and private area with normal soap. If you choose to wash your hair, wash first with your normal shampoo.  After you use shampoo/soap, rinse your hair and body thoroughly to remove shampoo/soap residue.  Turn the water OFF and apply half the bottle of CHG soap to a CLEAN washcloth.  Apply CHG soap ONLY FROM YOUR NECK DOWN TO YOUR TOES (washing for 3-5 minutes)  DO NOT use CHG soap on face, private areas, open wounds, or sores.  Pay special attention to the area where your surgery is being performed.  If you are having back surgery, having someone wash your back for you may be helpful. Wait 2 minutes after CHG soap is applied, then you may rinse off the CHG soap.  Pat dry with a clean towel  Put on clean pajamas    Additional instructions for the day of surgery: DO NOT APPLY any lotions, deodorants, cologne, or perfumes.   Do not wear jewelry or makeup Do not wear nail polish, gel polish, artificial nails, or any other type of covering on natural nails (fingers and toes) Do not bring valuables to the hospital. [facility] is not responsible for valuables/personal belongings. Put on clean/comfortable clothes.  Please brush your teeth.  Ask your nurse before applying any prescription medications to the skin.

## 2023-09-01 ENCOUNTER — Encounter (HOSPITAL_COMMUNITY)
Admission: RE | Admit: 2023-09-01 | Discharge: 2023-09-01 | Disposition: A | Discharge status: Home / Self Care | Source: Ambulatory Visit | Attending: Thoracic Surgery (Cardiothoracic Vascular Surgery) | Admitting: Thoracic Surgery (Cardiothoracic Vascular Surgery)

## 2023-09-01 ENCOUNTER — Other Ambulatory Visit: Payer: Self-pay

## 2023-09-01 ENCOUNTER — Ambulatory Visit (HOSPITAL_COMMUNITY)
Admission: RE | Admit: 2023-09-01 | Discharge: 2023-09-01 | Disposition: A | Discharge status: Home / Self Care | Source: Ambulatory Visit | Attending: Thoracic Surgery (Cardiothoracic Vascular Surgery)

## 2023-09-01 ENCOUNTER — Encounter (HOSPITAL_COMMUNITY): Payer: Self-pay

## 2023-09-01 VITALS — BP 134/86 | HR 82 | Temp 98.1°F | Resp 18 | Ht 69.0 in | Wt 185.6 lb

## 2023-09-01 DIAGNOSIS — Z01818 Encounter for other preprocedural examination: Secondary | ICD-10-CM | POA: Diagnosis not present

## 2023-09-01 DIAGNOSIS — I10 Essential (primary) hypertension: Secondary | ICD-10-CM | POA: Insufficient documentation

## 2023-09-01 DIAGNOSIS — K449 Diaphragmatic hernia without obstruction or gangrene: Secondary | ICD-10-CM | POA: Insufficient documentation

## 2023-09-01 DIAGNOSIS — I252 Old myocardial infarction: Secondary | ICD-10-CM | POA: Diagnosis not present

## 2023-09-01 DIAGNOSIS — I358 Other nonrheumatic aortic valve disorders: Secondary | ICD-10-CM | POA: Diagnosis not present

## 2023-09-01 DIAGNOSIS — Z955 Presence of coronary angioplasty implant and graft: Secondary | ICD-10-CM | POA: Insufficient documentation

## 2023-09-01 DIAGNOSIS — I2510 Atherosclerotic heart disease of native coronary artery without angina pectoris: Secondary | ICD-10-CM | POA: Insufficient documentation

## 2023-09-01 DIAGNOSIS — R0602 Shortness of breath: Secondary | ICD-10-CM | POA: Diagnosis not present

## 2023-09-01 DIAGNOSIS — Z5181 Encounter for therapeutic drug level monitoring: Secondary | ICD-10-CM

## 2023-09-01 HISTORY — DX: Cardiac arrhythmia, unspecified: I49.9

## 2023-09-01 HISTORY — DX: Acute myocardial infarction, unspecified: I21.9

## 2023-09-01 LAB — COMPREHENSIVE METABOLIC PANEL WITH GFR
ALT: 27 U/L (ref 0–44)
AST: 20 U/L (ref 15–41)
Albumin: 3.6 g/dL (ref 3.5–5.0)
Alkaline Phosphatase: 75 U/L (ref 38–126)
Anion gap: 10 (ref 5–15)
BUN: 21 mg/dL (ref 8–23)
CO2: 26 mmol/L (ref 22–32)
Calcium: 9.1 mg/dL (ref 8.9–10.3)
Chloride: 105 mmol/L (ref 98–111)
Creatinine, Ser: 1.23 mg/dL (ref 0.61–1.24)
GFR, Estimated: >60 mL/min (ref >60)
Glucose, Bld: 102 mg/dL — ABNORMAL HIGH (ref 70–99)
Potassium: 4.1 mmol/L (ref 3.5–5.1)
Sodium: 141 mmol/L (ref 135–145)
Total Bilirubin: 1.1 mg/dL (ref 0.0–1.2)
Total Protein: 6.4 g/dL — ABNORMAL LOW (ref 6.5–8.1)

## 2023-09-01 LAB — TYPE AND SCREEN
ABO/RH(D): O POS
Antibody Screen: NEGATIVE

## 2023-09-01 LAB — URINALYSIS, ROUTINE W REFLEX MICROSCOPIC
Bilirubin Urine: NEGATIVE
Glucose, UA: NEGATIVE mg/dL
Hgb urine dipstick: NEGATIVE
Ketones, ur: NEGATIVE mg/dL
Leukocytes,Ua: NEGATIVE
Nitrite: NEGATIVE
Protein, ur: NEGATIVE mg/dL
Specific Gravity, Urine: 1.015 (ref 1.005–1.030)
pH: 6 (ref 5.0–8.0)

## 2023-09-01 LAB — SURGICAL PCR SCREEN
MRSA, PCR: NEGATIVE
Staphylococcus aureus: NEGATIVE

## 2023-09-01 LAB — PROTIME-INR
INR: 1 (ref 0.8–1.2)
Prothrombin Time: 13 s (ref 11.4–15.2)

## 2023-09-01 LAB — CBC
HCT: 42.5 % (ref 39.0–52.0)
Hemoglobin: 13.9 g/dL (ref 13.0–17.0)
MCH: 30.1 pg (ref 26.0–34.0)
MCHC: 32.7 g/dL (ref 30.0–36.0)
MCV: 92 fL (ref 80.0–100.0)
Platelets: 184 10*3/uL (ref 150–400)
RBC: 4.62 MIL/uL (ref 4.22–5.81)
RDW: 13.9 % (ref 11.5–15.5)
WBC: 9.2 10*3/uL (ref 4.0–10.5)
nRBC: 0 % (ref 0.0–0.2)

## 2023-09-01 LAB — APTT: aPTT: 29 s (ref 24–36)

## 2023-09-01 NOTE — Anesthesia Preprocedure Evaluation (Addendum)
 Anesthesia Evaluation  Patient identified by MRN, date of birth, ID band Patient awake    Reviewed: Allergy & Precautions, NPO status , Patient's Chart, lab work & pertinent test results  Airway Mallampati: II  TM Distance: >3 FB Neck ROM: Full    Dental no notable dental hx.    Pulmonary PE   Pulmonary exam normal        Cardiovascular hypertension, + Past MI and + Cardiac Stents (09/25)  + dysrhythmias  Rhythm:Regular Rate:Normal  TTE 03/25/2023: 1. Left ventricular ejection fraction, by estimation, is 45 to 50%. The  left ventricle has mildly decreased function. The left ventricle  demonstrates regional wall motion abnormalities with inferoseptal and  inferior hypokinesis. Left ventricular  diastolic parameters are consistent with Grade I diastolic dysfunction  (impaired relaxation).   2. Right ventricular systolic function is normal. The right ventricular  size is mildly enlarged. There is normal pulmonary artery systolic  pressure. The estimated right ventricular systolic pressure is 27.6 mmHg.   3. Left atrial size was mild to moderately dilated.   4. Right atrial size was mildly dilated.   5. The mitral valve is normal in structure. Trivial mitral valve  regurgitation. No evidence of mitral stenosis.   6. The aortic valve is tricuspid. There is moderate calcification of the  aortic valve. Aortic valve regurgitation is not visualized. Aortic valve  sclerosis/calcification is present, without any evidence of aortic  stenosis.   7. The inferior vena cava is normal in size with greater than 50%  respiratory variability, suggesting right atrial pressure of 3 mmHg.     Neuro/Psych negative neurological ROS  negative psych ROS   GI/Hepatic Neg liver ROS, hiatal hernia,GERD  ,,  Endo/Other  negative endocrine ROS    Renal/GU negative Renal ROS  negative genitourinary   Musculoskeletal  (+) Arthritis ,  Osteoarthritis,    Abdominal Normal abdominal exam  (+)   Peds  Hematology Lab Results      Component                Value               Date                      WBC                      9.2                 09/01/2023                HGB                      13.9                09/01/2023                HCT                      42.5                09/01/2023                MCV                      92.0                09/01/2023  PLT                      184                 09/01/2023              Anesthesia Other Findings   Reproductive/Obstetrics                             Anesthesia Physical Anesthesia Plan  ASA: 3  Anesthesia Plan: General   Post-op Pain Management: Tylenol PO (pre-op)*   Induction: Intravenous  PONV Risk Score and Plan: 2 and Ondansetron, Dexamethasone and Treatment may vary due to age or medical condition  Airway Management Planned: Mask and Double Lumen EBT  Additional Equipment: ClearSight  Intra-op Plan:   Post-operative Plan: Extubation in OR  Informed Consent: I have reviewed the patients History and Physical, chart, labs and discussed the procedure including the risks, benefits and alternatives for the proposed anesthesia with the patient or authorized representative who has indicated his/her understanding and acceptance.     Dental advisory given  Plan Discussed with: CRNA  Anesthesia Plan Comments: (PAT note by Antionette Poles, PA-C: 78 year old male follows with cardiologist Dr. Jacinto Halim for history of HTN, HLD, CAD s/p acute inferior STEMI treated with stenting to the RCA in 01/2024.  He is also noted to have moderate disease in the LAD.  Echo 03/25/2023 showed EF 45 to 50% with inferoseptal and inferior hypokinesis, grade 1 DD, moderate aortic valve calcification without stenosis or regurgitation.  He was last seen by Dr. Jacinto Halim on 05/13/2023.  No angina at that time.  Also discussed that he was recently  found to have a large hiatal hernia causing shortness of breath and acid reflux as well as possible compression of the right inferior pulmonary vein.  Dr. Jacinto Halim recommended patient to follow-up with Dr. Cliffton Asters regarding surgical intervention with the caveat that he should wait least 6 months after MI before proceeding.  Last seen by pulmonologist Dr. Francine Graven on 07/21/2023 and at that time noted to have reactive airway following recent pneumonia.  He was prescribed budesonide nebulizer twice a day for 1 month as well as as needed albuterol.  Patient reports last dose of Brilinta on 08/30/2023.  Preop labs reviewed, unremarkable.  EKG 09/01/23: Sinus rhythm with occasional Premature ventricular complexes. Rate 85. Left axis deviation. Minimal voltage criteria for LVH, may be normal variant  TTE 03/25/2023: 1. Left ventricular ejection fraction, by estimation, is 45 to 50%. The  left ventricle has mildly decreased function. The left ventricle  demonstrates regional wall motion abnormalities with inferoseptal and  inferior hypokinesis. Left ventricular  diastolic parameters are consistent with Grade I diastolic dysfunction  (impaired relaxation).  2. Right ventricular systolic function is normal. The right ventricular  size is mildly enlarged. There is normal pulmonary artery systolic  pressure. The estimated right ventricular systolic pressure is 27.6 mmHg.  3. Left atrial size was mild to moderately dilated.  4. Right atrial size was mildly dilated.  5. The mitral valve is normal in structure. Trivial mitral valve  regurgitation. No evidence of mitral stenosis.  6. The aortic valve is tricuspid. There is moderate calcification of the  aortic valve. Aortic valve regurgitation is not visualized. Aortic valve  sclerosis/calcification is present, without any evidence of aortic  stenosis.  7. The inferior vena cava is normal in size with greater than 50%  respiratory variability, suggesting  right atrial pressure of 3 mmHg.   Cath/PCI 02/23/2023:    Prox LAD to Mid LAD lesion is 50% stenosed.   Mid LAD lesion is 60% stenosed.   1st Sept lesion is 50% stenosed.   Ost Cx to Prox Cx lesion is 30% stenosed.   Mid Cx lesion is 40% stenosed.   3rd Mrg lesion is 40% stenosed.   RPDA-2 lesion is 100% stenosed.   Dist RCA lesion is 30% stenosed.   RPDA-1 lesion is 30% stenosed.   Mid RCA lesion is 85% stenosed.   Prox RCA to Mid RCA lesion is 75% stenosed.   Ost RCA lesion is 20% stenosed.   A drug-eluting stent was successfully placed.   Post intervention, there is a 80% residual stenosis.   Post intervention, there is a 0% residual stenosis.   Post intervention, there is a 0% residual stenosis.   Recommend uninterrupted dual antiplatelet therapy with Aspirin 81mg  daily and Ticagrelor 90mg  twice daily for a minimum of 12 months (ACS-Class I recommendation).  Acute ST segment elevation myocardial infarction secondary to total mid distal PDA occlusion in a dominant RCA with high-grade mid stenoses.  Severe multivessel coronary calcification with 50 and 60% proximal LAD stenoses, irregularity of the left circumflex vessel with 30 to 40% stenoses.  Dominant RCA with posterior takeoff with 20% ostial narrowing followed by diffuse 75% to mid stenoses with 85% focal eccentric stenosis before the acute margin, 30 and 40% distal stenoses with total occlusion of the mid PDA.  Successful PCI to the RCA with PTCA of the totally occluded mid distal PDA with restoration of TIMI-3 flow and diffusely narrowed 80% small caliber vessel stenting to the apex.  Successful PCI to the segmental 75 and 85% mid RCA stenoses with ultimate insertion of a 3.5 x 38 mm Onyx frontier stent postdilated to 3.78 mm with the stenoses being reduced to 0%.  Difficult procedure secondary to aorta takeoff and posterior takeoff of the RCA.  LVEDP 23 mmHg  RECOMMENDATION: DAPT with  aspirin/Brilinta for minimum of 1 year.  Medical therapy for concomitant CAD.  Will try to rechallenge statin, if intolerant consider PCSK9 inhibition.  2D echo Doppler study.  Guideline directed medical therapy for HFrEF.  )        Anesthesia Quick Evaluation

## 2023-09-01 NOTE — Progress Notes (Signed)
 PCP - Dr. Eleanora Neighbor Cardiologist - Yates Decamp LOV 05-13-23 w/follow up in 6 months Pulmonology Dr. Melody Comas LOV 07-27-23 with follow up in 3 months  PPM/ICD - Denies Device Orders - n/a Rep Notified - n/a  Chest x-ray - 09-01-23 EKG - 09-01-23 Stress Test - 01-31-18 ECHO - 03-25-23 Cardiac Cath - 02-23-23  Sleep Study - Denies CPAP - n/a  NON-diabetic  Last dose of GLP1 agonist-  Denies GLP1 instructions: n/a  Blood Thinner Instructions: Brilinta - per cardiology hold for 5 days. Last dose on 08-30-23 Aspirin Instructions: per physician Hold the day of surgery.  Per patient Last dose was on 08-31-23.  ERAS Protcol - NPO PRE-SURGERY Ensure or G2- n/a  COVID TEST- No   Anesthesia review: yes, HTN, PAC, hx of PE, recent MI w/stent placement.   Patient denies shortness of breath, fever, cough and chest pain at PAT appointment. Patient denies any respiratory issues at this time.  States he is no longer coughing and does not feel any congestion.    All instructions explained to the patient, with a verbal understanding of the material. Patient agrees to go over the instructions while at home for a better understanding. Patient also instructed to self quarantine after being tested for COVID-19. The opportunity to ask questions was provided.

## 2023-09-01 NOTE — Progress Notes (Signed)
 Anesthesia Chart Review:  78 year old male follows with cardiologist Dr. Jacinto Halim for history of HTN, HLD, CAD s/p acute inferior STEMI treated with stenting to the RCA in 01/2024.  He is also noted to have moderate disease in the LAD.  Echo 03/25/2023 showed EF 45 to 50% with inferoseptal and inferior hypokinesis, grade 1 DD, moderate aortic valve calcification without stenosis or regurgitation.  He was last seen by Dr. Jacinto Halim on 05/13/2023.  No angina at that time.  Also discussed that he was recently found to have a large hiatal hernia causing shortness of breath and acid reflux as well as possible compression of the right inferior pulmonary vein.  Dr. Jacinto Halim recommended patient to follow-up with Dr. Cliffton Asters regarding surgical intervention with the caveat that he should wait least 6 months after MI before proceeding.  Last seen by pulmonologist Dr. Francine Graven on 07/21/2023 and at that time noted to have reactive airway following recent pneumonia.  He was prescribed budesonide nebulizer twice a day for 1 month as well as as needed albuterol.  Patient reports last dose of Brilinta on 08/30/2023.  Preop labs reviewed, unremarkable.  EKG 09/01/23: Sinus rhythm with occasional Premature ventricular complexes. Rate 85. Left axis deviation. Minimal voltage criteria for LVH, may be normal variant  TTE 03/25/2023: 1. Left ventricular ejection fraction, by estimation, is 45 to 50%. The  left ventricle has mildly decreased function. The left ventricle  demonstrates regional wall motion abnormalities with inferoseptal and  inferior hypokinesis. Left ventricular  diastolic parameters are consistent with Grade I diastolic dysfunction  (impaired relaxation).   2. Right ventricular systolic function is normal. The right ventricular  size is mildly enlarged. There is normal pulmonary artery systolic  pressure. The estimated right ventricular systolic pressure is 27.6 mmHg.   3. Left atrial size was mild to moderately  dilated.   4. Right atrial size was mildly dilated.   5. The mitral valve is normal in structure. Trivial mitral valve  regurgitation. No evidence of mitral stenosis.   6. The aortic valve is tricuspid. There is moderate calcification of the  aortic valve. Aortic valve regurgitation is not visualized. Aortic valve  sclerosis/calcification is present, without any evidence of aortic  stenosis.   7. The inferior vena cava is normal in size with greater than 50%  respiratory variability, suggesting right atrial pressure of 3 mmHg.   Cath/PCI 02/23/2023:    Prox LAD to Mid LAD lesion is 50% stenosed.   Mid LAD lesion is 60% stenosed.   1st Sept lesion is 50% stenosed.   Ost Cx to Prox Cx lesion is 30% stenosed.   Mid Cx lesion is 40% stenosed.   3rd Mrg lesion is 40% stenosed.   RPDA-2 lesion is 100% stenosed.   Dist RCA lesion is 30% stenosed.   RPDA-1 lesion is 30% stenosed.   Mid RCA lesion is 85% stenosed.   Prox RCA to Mid RCA lesion is 75% stenosed.   Ost RCA lesion is 20% stenosed.   A drug-eluting stent was successfully placed.   Post intervention, there is a 80% residual stenosis.   Post intervention, there is a 0% residual stenosis.   Post intervention, there is a 0% residual stenosis.   Recommend uninterrupted dual antiplatelet therapy with Aspirin 81mg  daily and Ticagrelor 90mg  twice daily for a minimum of 12 months (ACS-Class I recommendation).   Acute ST segment elevation myocardial infarction secondary to total mid distal PDA occlusion in a dominant RCA with high-grade mid stenoses.  Severe multivessel coronary calcification with 50 and 60% proximal LAD stenoses, irregularity of the left circumflex vessel with 30 to 40% stenoses.   Dominant RCA with posterior takeoff with 20% ostial narrowing followed by diffuse 75% to mid stenoses with 85% focal eccentric stenosis before the acute margin, 30 and 40% distal stenoses with total occlusion of the mid PDA.   Successful PCI  to the RCA with PTCA of the totally occluded mid distal PDA with restoration of TIMI-3 flow and diffusely narrowed 80% small caliber vessel stenting to the apex.  Successful PCI to the segmental 75 and 85% mid RCA stenoses with ultimate insertion of a 3.5 x 38 mm Onyx frontier stent postdilated to 3.78 mm with the stenoses being reduced to 0%.   Difficult procedure secondary to aorta takeoff and posterior takeoff of the RCA.   LVEDP 23 mmHg   RECOMMENDATION: DAPT with aspirin/Brilinta for minimum of 1 year.  Medical therapy for concomitant CAD.  Will try to rechallenge statin, if intolerant consider PCSK9 inhibition.  2D echo Doppler study.  Guideline directed medical therapy for HFrEF.    Zannie Cove [facility]/Anesthesiology Phone [phone] 09/01/2023 3:23 PM

## 2023-09-05 ENCOUNTER — Other Ambulatory Visit: Payer: Self-pay

## 2023-09-05 ENCOUNTER — Ambulatory Visit (HOSPITAL_COMMUNITY): Payer: Self-pay | Admitting: Certified Registered Nurse Anesthetist

## 2023-09-05 ENCOUNTER — Encounter (HOSPITAL_COMMUNITY): Payer: Self-pay | Admitting: Thoracic Surgery (Cardiothoracic Vascular Surgery)

## 2023-09-05 ENCOUNTER — Ambulatory Visit (HOSPITAL_COMMUNITY): Payer: Self-pay | Admitting: Physician Assistant

## 2023-09-05 ENCOUNTER — Inpatient Hospital Stay (HOSPITAL_COMMUNITY)
Admission: RE | Admit: 2023-09-05 | Discharge: 2023-09-07 | DRG: 328 | Disposition: A | Discharge status: Home / Self Care | Attending: Thoracic Surgery (Cardiothoracic Vascular Surgery) | Admitting: Thoracic Surgery (Cardiothoracic Vascular Surgery)

## 2023-09-05 ENCOUNTER — Observation Stay (HOSPITAL_COMMUNITY)

## 2023-09-05 ENCOUNTER — Encounter (HOSPITAL_COMMUNITY)
Admission: RE | Disposition: A | Discharge status: Home / Self Care | Payer: Self-pay | Source: Home / Self Care | Attending: Thoracic Surgery (Cardiothoracic Vascular Surgery)

## 2023-09-05 DIAGNOSIS — E785 Hyperlipidemia, unspecified: Secondary | ICD-10-CM | POA: Diagnosis not present

## 2023-09-05 DIAGNOSIS — Z8719 Personal history of other diseases of the digestive system: Secondary | ICD-10-CM

## 2023-09-05 DIAGNOSIS — R0989 Other specified symptoms and signs involving the circulatory and respiratory systems: Secondary | ICD-10-CM | POA: Diagnosis not present

## 2023-09-05 DIAGNOSIS — I2510 Atherosclerotic heart disease of native coronary artery without angina pectoris: Secondary | ICD-10-CM | POA: Diagnosis present

## 2023-09-05 DIAGNOSIS — Z79899 Other long term (current) drug therapy: Secondary | ICD-10-CM | POA: Diagnosis not present

## 2023-09-05 DIAGNOSIS — Z4682 Encounter for fitting and adjustment of non-vascular catheter: Secondary | ICD-10-CM | POA: Diagnosis not present

## 2023-09-05 DIAGNOSIS — Z951 Presence of aortocoronary bypass graft: Secondary | ICD-10-CM | POA: Diagnosis not present

## 2023-09-05 DIAGNOSIS — I10 Essential (primary) hypertension: Secondary | ICD-10-CM | POA: Diagnosis present

## 2023-09-05 DIAGNOSIS — I517 Cardiomegaly: Secondary | ICD-10-CM | POA: Diagnosis not present

## 2023-09-05 DIAGNOSIS — J9811 Atelectasis: Secondary | ICD-10-CM | POA: Diagnosis not present

## 2023-09-05 DIAGNOSIS — I252 Old myocardial infarction: Secondary | ICD-10-CM | POA: Diagnosis not present

## 2023-09-05 DIAGNOSIS — K449 Diaphragmatic hernia without obstruction or gangrene: Principal | ICD-10-CM

## 2023-09-05 DIAGNOSIS — K219 Gastro-esophageal reflux disease without esophagitis: Secondary | ICD-10-CM | POA: Diagnosis present

## 2023-09-05 DIAGNOSIS — E875 Hyperkalemia: Secondary | ICD-10-CM | POA: Diagnosis not present

## 2023-09-05 DIAGNOSIS — Z7982 Long term (current) use of aspirin: Secondary | ICD-10-CM | POA: Diagnosis not present

## 2023-09-05 DIAGNOSIS — Z8249 Family history of ischemic heart disease and other diseases of the circulatory system: Secondary | ICD-10-CM

## 2023-09-05 DIAGNOSIS — Z7902 Long term (current) use of antithrombotics/antiplatelets: Secondary | ICD-10-CM

## 2023-09-05 DIAGNOSIS — N400 Enlarged prostate without lower urinary tract symptoms: Secondary | ICD-10-CM | POA: Diagnosis present

## 2023-09-05 DIAGNOSIS — K5900 Constipation, unspecified: Secondary | ICD-10-CM | POA: Diagnosis not present

## 2023-09-05 DIAGNOSIS — J939 Pneumothorax, unspecified: Secondary | ICD-10-CM | POA: Diagnosis not present

## 2023-09-05 DIAGNOSIS — K224 Dyskinesia of esophagus: Secondary | ICD-10-CM | POA: Diagnosis not present

## 2023-09-05 DIAGNOSIS — I700 Atherosclerosis of aorta: Secondary | ICD-10-CM | POA: Diagnosis not present

## 2023-09-05 HISTORY — PX: XI ROBOTIC ASSISTED PARAESOPHAGEAL HERNIA REPAIR: SHX6871

## 2023-09-05 HISTORY — PX: ESOPHAGOGASTRODUODENOSCOPY: SHX5428

## 2023-09-05 LAB — CBC
HCT: 39 % (ref 39.0–52.0)
Hemoglobin: 12.9 g/dL — ABNORMAL LOW (ref 13.0–17.0)
MCH: 30.2 pg (ref 26.0–34.0)
MCHC: 33.1 g/dL (ref 30.0–36.0)
MCV: 91.3 fL (ref 80.0–100.0)
Platelets: 160 10*3/uL (ref 150–400)
RBC: 4.27 MIL/uL (ref 4.22–5.81)
RDW: 13.9 % (ref 11.5–15.5)
WBC: 8.7 10*3/uL (ref 4.0–10.5)
nRBC: 0 % (ref 0.0–0.2)

## 2023-09-05 LAB — ABO/RH: ABO/RH(D): O POS

## 2023-09-05 SURGERY — REPAIR, HERNIA, PARAESOPHAGEAL, ROBOT-ASSISTED
Anesthesia: General | Site: Chest

## 2023-09-05 MED ORDER — ALBUTEROL SULFATE (2.5 MG/3ML) 0.083% IN NEBU: 2.5000 mg | INHALATION_SOLUTION | Freq: Two times a day (BID) | RESPIRATORY_TRACT | Status: DC

## 2023-09-05 MED ORDER — 0.9 % SODIUM CHLORIDE (POUR BTL) OPTIME
TOPICAL | Status: DC | PRN
  Administered 2023-09-05: 2000 mL

## 2023-09-05 MED ORDER — EPHEDRINE SULFATE-NACL 50-0.9 MG/10ML-% IV SOSY
PREFILLED_SYRINGE | INTRAVENOUS | Status: DC | PRN
  Administered 2023-09-05: 10 mg via INTRAVENOUS
  Administered 2023-09-05: 5 mg via INTRAVENOUS
  Administered 2023-09-05: 10 mg via INTRAVENOUS

## 2023-09-05 MED ORDER — HYDROMORPHONE HCL 1 MG/ML IJ SOLN
INTRAMUSCULAR | Status: AC
  Filled 2023-09-05: qty 1

## 2023-09-05 MED ORDER — PHENYLEPHRINE HCL-NACL 20-0.9 MG/250ML-% IV SOLN
INTRAVENOUS | Status: DC | PRN
  Administered 2023-09-05: 20 ug/min via INTRAVENOUS

## 2023-09-05 MED ORDER — LACTATED RINGERS IV SOLN: INTRAVENOUS | Status: DC | PRN

## 2023-09-05 MED ORDER — PROPOFOL 10 MG/ML IV BOLUS
INTRAVENOUS | Status: AC
  Filled 2023-09-05: qty 20

## 2023-09-05 MED ORDER — LIDOCAINE 2% (20 MG/ML) 5 ML SYRINGE
INTRAMUSCULAR | Status: AC
  Filled 2023-09-05: qty 5

## 2023-09-05 MED ORDER — CEFAZOLIN SODIUM-DEXTROSE 2-4 GM/100ML-% IV SOLN
2.0000 g | INTRAVENOUS | Status: AC
  Administered 2023-09-05: 2 g via INTRAVENOUS
  Filled 2023-09-05: qty 100

## 2023-09-05 MED ORDER — HYDRALAZINE HCL 20 MG/ML IJ SOLN
10.0000 mg | Freq: Four times a day (QID) | INTRAMUSCULAR | Status: DC | PRN
  Administered 2023-09-06: 10 mg via INTRAVENOUS
  Filled 2023-09-05: qty 1

## 2023-09-05 MED ORDER — ACETAMINOPHEN 500 MG PO TABS: 1000.0000 mg | ORAL_TABLET | Freq: Once | ORAL | Status: DC

## 2023-09-05 MED ORDER — ROCURONIUM BROMIDE 10 MG/ML (PF) SYRINGE
PREFILLED_SYRINGE | INTRAVENOUS | Status: DC | PRN
  Administered 2023-09-05: 40 mg via INTRAVENOUS
  Administered 2023-09-05: 60 mg via INTRAVENOUS
  Administered 2023-09-05: 50 mg via INTRAVENOUS
  Administered 2023-09-05: 10 mg via INTRAVENOUS

## 2023-09-05 MED ORDER — ORAL CARE MOUTH RINSE: 15.0000 mL | Freq: Once | OROMUCOSAL | Status: AC

## 2023-09-05 MED ORDER — DEXAMETHASONE SODIUM PHOSPHATE 10 MG/ML IJ SOLN
INTRAMUSCULAR | Status: AC
  Filled 2023-09-05: qty 1

## 2023-09-05 MED ORDER — PROPOFOL 10 MG/ML IV BOLUS
INTRAVENOUS | Status: DC | PRN
  Administered 2023-09-05: 100 mg via INTRAVENOUS

## 2023-09-05 MED ORDER — FENTANYL CITRATE (PF) 250 MCG/5ML IJ SOLN
INTRAMUSCULAR | Status: DC | PRN
Start: 2023-09-05 — End: 2023-09-05
  Administered 2023-09-05: 50 ug via INTRAVENOUS
  Administered 2023-09-05: 100 ug via INTRAVENOUS
  Administered 2023-09-05: 50 ug via INTRAVENOUS

## 2023-09-05 MED ORDER — ACETAMINOPHEN 650 MG RE SUPP: 325.0000 mg | RECTAL | Status: DC | PRN

## 2023-09-05 MED ORDER — SODIUM CHLORIDE 0.9% FLUSH: 3.0000 mL | INTRAVENOUS | Status: DC | PRN

## 2023-09-05 MED ORDER — CHLORHEXIDINE GLUCONATE 0.12 % MT SOLN
15.0000 mL | Freq: Once | OROMUCOSAL | Status: AC
  Administered 2023-09-05: 15 mL via OROMUCOSAL
  Filled 2023-09-05: qty 15

## 2023-09-05 MED ORDER — DEXAMETHASONE SODIUM PHOSPHATE 10 MG/ML IJ SOLN
INTRAMUSCULAR | Status: DC | PRN
  Administered 2023-09-05: 10 mg via INTRAVENOUS

## 2023-09-05 MED ORDER — SUGAMMADEX SODIUM 200 MG/2ML IV SOLN
INTRAVENOUS | Status: DC | PRN
  Administered 2023-09-05: 168.4 mg via INTRAVENOUS

## 2023-09-05 MED ORDER — ONDANSETRON HCL 4 MG/2ML IJ SOLN
INTRAMUSCULAR | Status: AC
  Filled 2023-09-05: qty 2

## 2023-09-05 MED ORDER — ACETAMINOPHEN 10 MG/ML IV SOLN
INTRAVENOUS | Status: AC
  Filled 2023-09-05: qty 100

## 2023-09-05 MED ORDER — FENTANYL CITRATE (PF) 250 MCG/5ML IJ SOLN
INTRAMUSCULAR | Status: AC
  Filled 2023-09-05: qty 5

## 2023-09-05 MED ORDER — ROCURONIUM BROMIDE 10 MG/ML (PF) SYRINGE
PREFILLED_SYRINGE | INTRAVENOUS | Status: AC
  Filled 2023-09-05: qty 10

## 2023-09-05 MED ORDER — METOCLOPRAMIDE HCL 5 MG/ML IJ SOLN
INTRAMUSCULAR | Status: AC
  Filled 2023-09-05: qty 2

## 2023-09-05 MED ORDER — PHENYLEPHRINE 80 MCG/ML (10ML) SYRINGE FOR IV PUSH (FOR BLOOD PRESSURE SUPPORT)
PREFILLED_SYRINGE | INTRAVENOUS | Status: DC | PRN
  Administered 2023-09-05 (×3): 80 ug via INTRAVENOUS

## 2023-09-05 MED ORDER — VASOPRESSIN 20 UNIT/ML IV SOLN
INTRAVENOUS | Status: AC
  Filled 2023-09-05: qty 1

## 2023-09-05 MED ORDER — ALBUMIN HUMAN 5 % IV SOLN: INTRAVENOUS | Status: DC | PRN

## 2023-09-05 MED ORDER — ONDANSETRON HCL 4 MG/2ML IJ SOLN
INTRAMUSCULAR | Status: DC | PRN
  Administered 2023-09-05: 4 mg via INTRAVENOUS

## 2023-09-05 MED ORDER — MORPHINE SULFATE (PF) 2 MG/ML IV SOLN
2.0000 mg | INTRAVENOUS | Status: DC | PRN
  Administered 2023-09-05 – 2023-09-06 (×8): 2 mg via INTRAVENOUS
  Filled 2023-09-05 (×8): qty 1

## 2023-09-05 MED ORDER — DIPHENHYDRAMINE HCL 50 MG/ML IJ SOLN
INTRAMUSCULAR | Status: AC
  Filled 2023-09-05: qty 1

## 2023-09-05 MED ORDER — GLYCOPYRROLATE PF 0.2 MG/ML IJ SOSY
PREFILLED_SYRINGE | INTRAMUSCULAR | Status: AC
  Filled 2023-09-05: qty 1

## 2023-09-05 MED ORDER — SODIUM CHLORIDE 0.9% FLUSH: 3.0000 mL | Freq: Two times a day (BID) | INTRAVENOUS | Status: DC

## 2023-09-05 MED ORDER — BUPIVACAINE LIPOSOME 1.3 % IJ SUSP: INTRAMUSCULAR | Status: DC | PRN

## 2023-09-05 MED ORDER — GLYCOPYRROLATE PF 0.2 MG/ML IJ SOSY
PREFILLED_SYRINGE | INTRAMUSCULAR | Status: DC | PRN
  Administered 2023-09-05: .2 mg via INTRAVENOUS

## 2023-09-05 MED ORDER — BUDESONIDE 0.25 MG/2ML IN SUSP
0.2500 mg | Freq: Two times a day (BID) | RESPIRATORY_TRACT | Status: DC
  Administered 2023-09-05 – 2023-09-07 (×4): 0.25 mg via RESPIRATORY_TRACT
  Filled 2023-09-05 (×4): qty 2

## 2023-09-05 MED ORDER — ALBUTEROL SULFATE (2.5 MG/3ML) 0.083% IN NEBU
2.5000 mg | INHALATION_SOLUTION | Freq: Four times a day (QID) | RESPIRATORY_TRACT | Status: DC | PRN
Start: 2023-09-05 — End: 2023-09-07

## 2023-09-05 MED ORDER — ACETAMINOPHEN 10 MG/ML IV SOLN
INTRAVENOUS | Status: DC | PRN
  Administered 2023-09-05: 1000 mg via INTRAVENOUS

## 2023-09-05 MED ORDER — BUPIVACAINE LIPOSOME 1.3 % IJ SUSP
INTRAMUSCULAR | Status: AC
  Filled 2023-09-05: qty 20

## 2023-09-05 MED ORDER — EPINEPHRINE 1 MG/10ML IJ SOSY
PREFILLED_SYRINGE | INTRAMUSCULAR | Status: DC | PRN
Start: 2023-09-05 — End: 2023-09-05
  Administered 2023-09-05: .5 mg via INTRAVENOUS

## 2023-09-05 MED ORDER — CEFAZOLIN SODIUM-DEXTROSE 2-4 GM/100ML-% IV SOLN
2.0000 g | Freq: Three times a day (TID) | INTRAVENOUS | Status: AC
  Administered 2023-09-05: 2 g via INTRAVENOUS
  Filled 2023-09-05: qty 100

## 2023-09-05 MED ORDER — ENOXAPARIN SODIUM 40 MG/0.4ML IJ SOSY
40.0000 mg | PREFILLED_SYRINGE | INTRAMUSCULAR | Status: DC
  Administered 2023-09-06 – 2023-09-07 (×2): 40 mg via SUBCUTANEOUS
  Filled 2023-09-05 (×2): qty 0.4

## 2023-09-05 MED ORDER — ONDANSETRON HCL 4 MG/2ML IJ SOLN
4.0000 mg | Freq: Four times a day (QID) | INTRAMUSCULAR | Status: DC
  Administered 2023-09-05 – 2023-09-06 (×3): 4 mg via INTRAVENOUS
  Filled 2023-09-05 (×3): qty 2

## 2023-09-05 MED ORDER — DIPHENHYDRAMINE HCL 50 MG/ML IJ SOLN
INTRAMUSCULAR | Status: DC | PRN
  Administered 2023-09-05: 12.5 mg via INTRAVENOUS

## 2023-09-05 MED ORDER — BUPIVACAINE HCL (PF) 0.5 % IJ SOLN
INTRAMUSCULAR | Status: AC
  Filled 2023-09-05: qty 30

## 2023-09-05 MED ORDER — HYDROMORPHONE HCL 1 MG/ML IJ SOLN
0.2500 mg | INTRAMUSCULAR | Status: DC | PRN
  Administered 2023-09-05 (×5): 0.25 mg via INTRAVENOUS

## 2023-09-05 MED ORDER — LIDOCAINE 2% (20 MG/ML) 5 ML SYRINGE
INTRAMUSCULAR | Status: DC | PRN
  Administered 2023-09-05: 60 mg via INTRAVENOUS

## 2023-09-05 SURGICAL SUPPLY — 67 items
BLADE SURG 11 STRL SS (BLADE) ×2 IMPLANT
BUTTON OLYMPUS DEFENDO 5 PIECE (MISCELLANEOUS) ×2 IMPLANT
CANISTER SUCT 3000ML PPV (MISCELLANEOUS) ×4 IMPLANT
CNTNR URN SCR LID CUP LEK RST (MISCELLANEOUS) ×2 IMPLANT
DEFOGGER SCOPE WARMER CLEARIFY (MISCELLANEOUS) ×2 IMPLANT
DERMABOND ADVANCED .7 DNX12 (GAUZE/BANDAGES/DRESSINGS) ×2 IMPLANT
DEVICE SUTURE ENDOST 10MM (ENDOMECHANICALS) IMPLANT
DRAPE ARM DVNC X/XI (DISPOSABLE) ×8 IMPLANT
DRAPE COLUMN DVNC XI (DISPOSABLE) ×2 IMPLANT
DRAPE CV SPLIT W-CLR ANES SCRN (DRAPES) ×2 IMPLANT
DRAPE INCISE IOBAN 66X45 STRL (DRAPES) IMPLANT
DRAPE SURG ORHT 6 SPLT 77X108 (DRAPES) ×2 IMPLANT
DRIVER NDL LRG 8 DVNC XI (INSTRUMENTS) IMPLANT
DRIVER NDL MEGA SUTCUT DVNCXI (INSTRUMENTS) IMPLANT
DRIVER NDLE LRG 8 DVNC XI (INSTRUMENTS) ×2 IMPLANT
DRIVER NDLE MEGA SUTCUT DVNCXI (INSTRUMENTS) ×2 IMPLANT
ELECT REM PT RETURN 9FT ADLT (ELECTROSURGICAL) ×2 IMPLANT
ELECTRODE REM PT RTRN 9FT ADLT (ELECTROSURGICAL) ×2 IMPLANT
FELT TEFLON 1X6 (MISCELLANEOUS) IMPLANT
FORCEPS BPLR LNG DVNC XI (INSTRUMENTS) IMPLANT
FORCEPS CADIERE DVNC XI (FORCEP) IMPLANT
GAUZE SPONGE 4X4 12PLY STRL (GAUZE/BANDAGES/DRESSINGS) ×2 IMPLANT
GLOVE BIO SURGEON STRL SZ7 (GLOVE) ×2 IMPLANT
GLOVE BIO SURGEON STRL SZ7.5 (GLOVE) ×6 IMPLANT
GOWN STRL REUS W/ TWL LRG LVL3 (GOWN DISPOSABLE) ×2 IMPLANT
GOWN STRL REUS W/ TWL XL LVL3 (GOWN DISPOSABLE) ×4 IMPLANT
GOWN STRL REUS W/TWL 2XL LVL3 (GOWN DISPOSABLE) ×2 IMPLANT
GRASPER TIP-UP FEN DVNC XI (INSTRUMENTS) IMPLANT
HEMOSTAT SURGICEL 2X14 (HEMOSTASIS) ×2 IMPLANT
IV NS 1000ML BAXH (IV SOLUTION) IMPLANT
KIT BASIN OR (CUSTOM PROCEDURE TRAY) ×2 IMPLANT
KIT TURNOVER KIT B (KITS) ×2 IMPLANT
MARKER SKIN DUAL TIP RULER LAB (MISCELLANEOUS) ×2 IMPLANT
MESH OVITEX 1S RESORB 6X10 6L (Mesh General) IMPLANT
NDL HYPO 22X1.5 SAFETY MO (MISCELLANEOUS) ×2 IMPLANT
NEEDLE HYPO 22X1.5 SAFETY MO (MISCELLANEOUS) ×2 IMPLANT
NS IRRIG 1000ML POUR BTL (IV SOLUTION) ×4 IMPLANT
OBTURATOR OPTICAL STND 8 DVNC (TROCAR) ×2 IMPLANT
OBTURATOR OPTICALSTD 8 DVNC (TROCAR) ×2 IMPLANT
OIL SILICONE PENTAX (PARTS (SERVICE/REPAIRS)) IMPLANT
PACK CHEST (CUSTOM PROCEDURE TRAY) ×2 IMPLANT
PAD ARMBOARD POSITIONER FOAM (MISCELLANEOUS) ×4 IMPLANT
PORT ACCESS TROCAR AIRSEAL 12 (TROCAR) IMPLANT
SEAL UNIV 5-12 XI (MISCELLANEOUS) ×8 IMPLANT
SEALER SYNCHRO 8 IS4000 DVNC (MISCELLANEOUS) IMPLANT
SET TRI-LUMEN FLTR TB AIRSEAL (TUBING) ×2 IMPLANT
SUT ETHIBOND 0 36 GRN (SUTURE) ×4 IMPLANT
SUT SILK 1 MH (SUTURE) ×2 IMPLANT
SUT SURGIDAC NAB ES-9 0 48 120 (SUTURE) IMPLANT
SUT VIC AB 2-0 CT1 18 (SUTURE) ×2 IMPLANT
SUT VIC AB 3-0 SH 27X BRD (SUTURE) ×4 IMPLANT
SUT VICRYL 0 UR6 27IN ABS (SUTURE) ×4 IMPLANT
SUT VLOC 180 2-0 6IN GS21 (SUTURE) IMPLANT
SUT VLOC 180 2-0 9IN GS21 (SUTURE) IMPLANT
SYR 20ML ECCENTRIC (SYRINGE) ×2 IMPLANT
SYSTEM SAHARA CHEST DRAIN ATS (WOUND CARE) IMPLANT
TOWEL GREEN STERILE (TOWEL DISPOSABLE) ×2 IMPLANT
TOWEL GREEN STERILE FF (TOWEL DISPOSABLE) ×2 IMPLANT
TRAY FOLEY MTR SLVR 16FR STAT (SET/KITS/TRAYS/PACK) ×2 IMPLANT
TRAY WAYNE PNEUMOTHORAX 14X18 (TRAY / TRAY PROCEDURE) IMPLANT
TROCAR PORT AIRSEAL 8X120 (TROCAR) IMPLANT
TROCAR XCEL BLADELESS 5X75MML (TROCAR) ×2 IMPLANT
TROCAR XCEL NON-BLD 5MMX100MML (ENDOMECHANICALS) IMPLANT
TUBE CONNECTING 20X1/4 (TUBING) ×2 IMPLANT
TUBING ENDO SMARTCAP (MISCELLANEOUS) ×2 IMPLANT
UNDERPAD 30X36 HEAVY ABSORB (UNDERPADS AND DIAPERS) ×2 IMPLANT
WATER STERILE IRR 1000ML POUR (IV SOLUTION) ×2 IMPLANT

## 2023-09-05 NOTE — Interval H&P Note (Signed)
 History and Physical Interval Note:  09/05/2023 7:36 AM  Raymond Stanley  has presented today for surgery, with the diagnosis of PEH.  The various methods of treatment have been discussed with the patient and family. After consideration of risks, benefits and other options for treatment, the patient has consented to  Procedure(s): REPAIR, HERNIA, PARAESOPHAGEAL, ROBOT-ASSISTED (N/A) EGD (ESOPHAGOGASTRODUODENOSCOPY) (N/A) as a surgical intervention.  The patient's history has been reviewed, patient examined, no change in status, stable for surgery.  I have reviewed the patient's chart and labs.  Questions were answered to the patient's satisfaction.     Annaleigha Woo Keane Scrape

## 2023-09-05 NOTE — Op Note (Signed)
 [address]       [place] [address]             [phone]        09/05/2023  Patient:  Raymond Stanley Pre-Op Dx: paraesophageal hernia   Post-op Dx:  same Procedure: - Esophagoscopy - Robotic assisted laparoscopy - Paraesophageal hernia repair with Ovitex mesh pledgets  - Nissen fundoplication - 64F Right pigtail chest tube placement   Surgeon and Role:      * Shontay Wallner, Eliezer Lofts, MD - Primary  Assistant: Jaclyn Prime, PA-C  An experienced assistant was required given the complexity of this surgery and the standard of surgical care. The assistant was needed for exposure, dissection, suctioning, retraction of delicate tissues and sutures, instrument exchange and for overall help during this procedure.   Anesthesia  general EBL:  13ml Blood Administration: none Specimen:  none   Counts: correct   Indications: 78yo male with CAD, and a moderate hiatal hernia.  I think that his shortness of breath may be a combination of mass effect, and silent aspiration given his history of reflux.  He is willing to proceed with a hiatal hernia repair.  Will hold his Brilinta.  Findings: Large defect with stomach and omentum.  4 posterior, 1 anterior stitch.  R pleura was entered.  64F pigtail chest tube placed.    Operative Technique: After the risks, benefits and alternatives were thoroughly discussed, the patient was brought to the operative theatre.  Anesthesia was induced, and the esophagoscope was passed through the oropharynx down to the stomach.  The scope was retroflexed and the hiatal hernia was clearly evident.  The scope was pulled back and the mucosal surface of the esophagus was visualized.    The scope was then parked at 25 cm from the incisors.  The patient was then prepped and draped in normal sterile fashion.  An appropriate surgical pause was performed, and pre-operative antibiotics were dosed accordingly.  We began with a 1 cm incision 15 cm caudad  from the xiphoid and slightly lateral to the umbilicus.  Using an Optiview we entered the peritoneal space.  The abdomen was then insufflated with CO2.  3 other robotic ports were placed to triangulate the hiatus.  Another 12 mm port was placed in place at the level of the umbilicus laterally for an assistant port and another 5 mm trocar was placed in the right lower quadrant for liver retractor.  The patient was then placed in steep reverse Trendelenburg and the liver was elevated to expose the esophageal hiatus.  And then the robot was docked.  We began by dividing the gastrohepatic ligament to expose the right diaphragmatic crus and then dissected the hernia sac in a clockwise fashion to mobilize there the stomach and esophagus.  We then divided the short gastrics and moved towards the right crus and completed our dissection along the esophageal hiatus.  A Penrose drain was then used to encircle the the esophagus and we continued our dissection up into the mediastinum.  The right pleura was entered, and a 64F chest tube was placed.  Once we had achieved 3 to 4 cm of intra-abdominal esophagus we then proceeded to reapproximate the crura with 0 Ethibond sutures in an interrupted fashion.  The gastroscope was passed down through the lower esophageal sphincter into the stomach and would act as our bougie during this repair.  Next the stomach was passed posterior to the esophagus and Nissen  fundoplication was performed.  An air leak test was performed using the gastroscope.  No leak was evident.  The liver retractor was removed and all ports were removed under direct visualization.  The skin and soft tissue were closed with absorbable suture    The patient tolerated the procedure without any immediate complications, and was transferred to the PACU in stable condition.  Letticia Bhattacharyya Keane Scrape

## 2023-09-05 NOTE — Hospital Course (Addendum)
 History of Present Illness:  Raymond Stanley 78 y.o. male presents for surgical evaluation of a hiatal hernia.  Earlier this fall, he was treated with PCI for CAD, but has had persistent exertional shortness of breath.  He has been evaluated by cardiology and pulmonary and it is felt that his ongoing shortness of breath is related to his hiatal hernia. Of note, he has had progressive reflux that has been refractory to medical therapy.  He has also had recurrent upper respiratory infections.  He denies any dysphagia or odynophagia.  He was evaluated by Dr. Cliffton Asters who felt the patient would benefit from repair of paraesophageal hernia.  The risks and benefits of the procedure were explained to the patient and he was agreeable to proceed.  Hospital Course:  Jhamari Markowicz presented to [facility] on 09/05/2023.  The patient was taken to the operating room and underwent Robotic Assisted Paraesophageal Hernia Repair, Fundoplication, and placement of right sided pigtail catheter.  The patient tolerated the procedure without difficulty, was extubated, and taken to the PACU in stable condition.

## 2023-09-05 NOTE — Transfer of Care (Signed)
 Immediate Anesthesia Transfer of Care Note  Patient: Raymond Stanley  Procedure(s) Performed: REPAIR, HERNIA, PARAESOPHAGEAL, ROBOT-ASSISTED USING OVITEX REINFORCED TISSUE MATRIX (Chest) EGD (ESOPHAGOGASTRODUODENOSCOPY)  Patient Location: PACU  Anesthesia Type:General  Level of Consciousness: awake, alert , and oriented  Airway & Oxygen Therapy: Patient Spontanous Breathing and Patient connected to nasal cannula oxygen  Post-op Assessment: Report given to RN, Post -op Vital signs reviewed and stable, Patient moving all extremities X 4, and Patient able to stick tongue midline  Post vital signs: Reviewed and stable  Last Vitals:  Vitals Value Taken Time  BP 113/66 09/05/23 1053  Temp 98.6   Pulse 85 09/05/23 1059  Resp 18 09/05/23 1059  SpO2 91 % 09/05/23 1059  Vitals shown include unfiled device data.  Last Pain:  Vitals:   09/05/23 0620  TempSrc: Tympanic  PainSc:          Complications: No notable events documented.

## 2023-09-05 NOTE — Plan of Care (Signed)
  Problem: Clinical Measurements: Goal: Will remain free from infection Outcome: Progressing   Problem: Activity: Goal: Risk for activity intolerance will decrease Outcome: Progressing   Problem: Nutrition: Goal: Adequate nutrition will be maintained Outcome: Progressing   Problem: Pain Managment: Goal: General experience of comfort will improve and/or be controlled Outcome: Progressing

## 2023-09-05 NOTE — Brief Op Note (Signed)
 09/05/2023  10:42 AM  PATIENT:  Raymond Stanley  78 y.o. male  PRE-OPERATIVE DIAGNOSIS:  PARESOPHAGEAL HERNA  POST-OPERATIVE DIAGNOSIS:  PARESOPHAGEAL HERNA  PROCEDURE:  Procedure(s):  REPAIR, HERNIA, PARAESOPHAGEAL, ROBOT-ASSISTED USING OVITEX REINFORCED TISSUE MATRIX (N/A) EGD (ESOPHAGOGASTRODUODENOSCOPY) (N/A)  SURGEON:  Surgeons and Role:    * Lightfoot, Eliezer Lofts, MD - Primary  PHYSICIAN ASSISTANT: Lowella Dandy PA-C, Samir Halalou PA-Student  ASSISTANTS: none   ANESTHESIA:   general  EBL:  10 mL   BLOOD ADMINISTERED:none  DRAINS:  Pigtail Catheter RIght Chest    LOCAL MEDICATIONS USED:  BUPIVICAINE   SPECIMEN:  No Specimen  DISPOSITION OF SPECIMEN:  N/A  COUNTS:  YES  TOURNIQUET:  * No tourniquets in log *  DICTATION: .Dragon Dictation  PLAN OF CARE: Admit to inpatient   PATIENT DISPOSITION:  PACU - hemodynamically stable.   Delay start of Pharmacological VTE agent (>24hrs) due to surgical blood loss or risk of bleeding: no

## 2023-09-05 NOTE — Anesthesia Procedure Notes (Signed)
 Procedure Name: Intubation Date/Time: 09/05/2023 7:55 AM  Performed by: Cy Blamer, CRNAPre-anesthesia Checklist: Patient identified, Emergency Drugs available, Suction available and Patient being monitored Patient Re-evaluated:Patient Re-evaluated prior to induction Oxygen Delivery Method: Circle system utilized Preoxygenation: Pre-oxygenation with 100% oxygen Induction Type: IV induction Ventilation: Mask ventilation without difficulty Laryngoscope Size: Miller and 2 Grade View: Grade I Tube type: Oral Tube size: 7.5 mm Number of attempts: 1 Airway Equipment and Method: Stylet Placement Confirmation: ETT inserted through vocal cords under direct vision, positive ETCO2 and breath sounds checked- equal and bilateral Secured at: 22 cm Tube secured with: Tape Dental Injury: Teeth and Oropharynx as per pre-operative assessment

## 2023-09-06 ENCOUNTER — Encounter (HOSPITAL_COMMUNITY): Payer: Self-pay | Admitting: Thoracic Surgery (Cardiothoracic Vascular Surgery)

## 2023-09-06 ENCOUNTER — Observation Stay (HOSPITAL_COMMUNITY)

## 2023-09-06 DIAGNOSIS — I517 Cardiomegaly: Secondary | ICD-10-CM | POA: Diagnosis not present

## 2023-09-06 DIAGNOSIS — K449 Diaphragmatic hernia without obstruction or gangrene: Secondary | ICD-10-CM | POA: Diagnosis present

## 2023-09-06 DIAGNOSIS — Z951 Presence of aortocoronary bypass graft: Secondary | ICD-10-CM | POA: Diagnosis not present

## 2023-09-06 DIAGNOSIS — Z4682 Encounter for fitting and adjustment of non-vascular catheter: Secondary | ICD-10-CM | POA: Diagnosis not present

## 2023-09-06 DIAGNOSIS — I700 Atherosclerosis of aorta: Secondary | ICD-10-CM | POA: Diagnosis not present

## 2023-09-06 DIAGNOSIS — I2510 Atherosclerotic heart disease of native coronary artery without angina pectoris: Secondary | ICD-10-CM | POA: Diagnosis present

## 2023-09-06 DIAGNOSIS — Z79899 Other long term (current) drug therapy: Secondary | ICD-10-CM | POA: Diagnosis not present

## 2023-09-06 DIAGNOSIS — K5900 Constipation, unspecified: Secondary | ICD-10-CM | POA: Diagnosis not present

## 2023-09-06 DIAGNOSIS — K219 Gastro-esophageal reflux disease without esophagitis: Secondary | ICD-10-CM | POA: Diagnosis present

## 2023-09-06 DIAGNOSIS — E875 Hyperkalemia: Secondary | ICD-10-CM | POA: Diagnosis not present

## 2023-09-06 DIAGNOSIS — Z7982 Long term (current) use of aspirin: Secondary | ICD-10-CM | POA: Diagnosis not present

## 2023-09-06 DIAGNOSIS — Z7902 Long term (current) use of antithrombotics/antiplatelets: Secondary | ICD-10-CM | POA: Diagnosis not present

## 2023-09-06 DIAGNOSIS — Z8249 Family history of ischemic heart disease and other diseases of the circulatory system: Secondary | ICD-10-CM | POA: Diagnosis not present

## 2023-09-06 DIAGNOSIS — I10 Essential (primary) hypertension: Secondary | ICD-10-CM | POA: Diagnosis present

## 2023-09-06 DIAGNOSIS — N400 Enlarged prostate without lower urinary tract symptoms: Secondary | ICD-10-CM | POA: Diagnosis present

## 2023-09-06 DIAGNOSIS — K224 Dyskinesia of esophagus: Secondary | ICD-10-CM | POA: Diagnosis not present

## 2023-09-06 DIAGNOSIS — I252 Old myocardial infarction: Secondary | ICD-10-CM | POA: Diagnosis not present

## 2023-09-06 LAB — BASIC METABOLIC PANEL WITH GFR
Anion gap: 11 (ref 5–15)
BUN: 20 mg/dL (ref 8–23)
CO2: 24 mmol/L (ref 22–32)
Calcium: 9.7 mg/dL (ref 8.9–10.3)
Chloride: 102 mmol/L (ref 98–111)
Creatinine, Ser: 1.02 mg/dL (ref 0.61–1.24)
GFR, Estimated: >60 mL/min (ref >60)
Glucose, Bld: 142 mg/dL — ABNORMAL HIGH (ref 70–99)
Potassium: 5.9 mmol/L — ABNORMAL HIGH (ref 3.5–5.1)
Sodium: 137 mmol/L (ref 135–145)

## 2023-09-06 MED ORDER — SODIUM ZIRCONIUM CYCLOSILICATE 5 G PO PACK: 5.0000 g | PACK | Freq: Every day | ORAL | Status: DC

## 2023-09-06 MED ORDER — OXYCODONE HCL 5 MG/5ML PO SOLN: 5.0000 mg | ORAL | Status: DC | PRN

## 2023-09-06 MED ORDER — ONDANSETRON HCL 4 MG/2ML IJ SOLN: 4.0000 mg | Freq: Four times a day (QID) | INTRAMUSCULAR | Status: DC | PRN

## 2023-09-06 MED ORDER — IOHEXOL 300 MG/ML  SOLN
100.0000 mL | Freq: Once | INTRAMUSCULAR | Status: DC | PRN
Start: 2023-09-06 — End: 2023-09-07

## 2023-09-06 MED ORDER — PANTOPRAZOLE SODIUM 40 MG PO TBEC
80.0000 mg | DELAYED_RELEASE_TABLET | Freq: Every day | ORAL | Status: DC
  Administered 2023-09-07: 80 mg via ORAL
  Filled 2023-09-06: qty 2

## 2023-09-06 MED ORDER — FAMOTIDINE 20 MG PO TABS
20.0000 mg | ORAL_TABLET | Freq: Every day | ORAL | Status: DC
Start: 2023-09-06 — End: 2023-09-07
  Administered 2023-09-06: 20 mg via ORAL
  Filled 2023-09-06: qty 1

## 2023-09-06 MED ORDER — SODIUM ZIRCONIUM CYCLOSILICATE 5 G PO PACK
5.0000 g | PACK | Freq: Once | ORAL | Status: AC
  Administered 2023-09-06: 5 g via ORAL
  Filled 2023-09-06: qty 1

## 2023-09-06 MED ORDER — ACETAMINOPHEN 160 MG/5ML PO SOLN
650.0000 mg | Freq: Four times a day (QID) | ORAL | Status: DC | PRN
Start: 2023-09-06 — End: 2023-09-07
  Administered 2023-09-06 – 2023-09-07 (×3): 650 mg via ORAL
  Filled 2023-09-06 (×3): qty 20.3

## 2023-09-06 MED ORDER — AMLODIPINE BESYLATE 10 MG PO TABS
10.0000 mg | ORAL_TABLET | Freq: Every day | ORAL | Status: DC
Start: 2023-09-06 — End: 2023-09-07
  Administered 2023-09-06 – 2023-09-07 (×2): 10 mg via ORAL
  Filled 2023-09-06 (×2): qty 1

## 2023-09-06 MED ORDER — TEMAZEPAM 15 MG PO CAPS
15.0000 mg | ORAL_CAPSULE | Freq: Every evening | ORAL | Status: DC | PRN
  Administered 2023-09-06: 15 mg via ORAL
  Filled 2023-09-06: qty 1

## 2023-09-06 NOTE — Discharge Summary (Signed)
 Physician Discharge Summary  Patient ID: Raymond Stanley MRN: [id number] DOB/AGE: Oct 18, 1945 78 y.o.  Admit date: 09/05/2023 Discharge date: 09/07/2023  Admission Diagnoses:  Patient Active Problem List   Diagnosis Date Noted   Paraesophageal hernia 09/05/2023   BPH (benign prostatic hyperplasia) 06/22/2023   HTN (hypertension) 06/22/2023   Hypertension 02/25/2023   Hyperlipidemia 02/25/2023   STEMI involving right coronary artery (HCC) 02/23/2023   Left ventricular dysfunction with reduced left ventricular function 10/30/2020   Neck mass 08/19/2020   GERD (gastroesophageal reflux disease) 07/10/2020   Hiatal hernia 07/10/2020   Pleuritic pain 07/10/2020   Aortic atherosclerosis (HCC) 06/05/2020   Left-sided chest wall pain 06/05/2020   Unilateral primary osteoarthritis, left knee 09/22/2016   Discharge Diagnoses:   Patient Active Problem List   Diagnosis Date Noted   Paraesophageal hernia 09/05/2023   S/P repair of paraesophageal hernia 09/05/2023   BPH (benign prostatic hyperplasia) 06/22/2023   HTN (hypertension) 06/22/2023   Hypertension 02/25/2023   Hyperlipidemia 02/25/2023   STEMI involving right coronary artery (HCC) 02/23/2023   Left ventricular dysfunction with reduced left ventricular function 10/30/2020   Neck mass 08/19/2020   GERD (gastroesophageal reflux disease) 07/10/2020   Hiatal hernia 07/10/2020   Pleuritic pain 07/10/2020   Aortic atherosclerosis (HCC) 06/05/2020   Left-sided chest wall pain 06/05/2020   Unilateral primary osteoarthritis, left knee 09/22/2016   Discharged Condition: good  History of Present Illness:  Raymond Stanley 78 y.o. male presents for surgical evaluation of a hiatal hernia.  Earlier this fall, he was treated with PCI for CAD, but has had persistent exertional shortness of breath.  He has been evaluated by cardiology and pulmonary and it is felt that his ongoing shortness of breath is related to his hiatal hernia. Of note, he has  had progressive reflux that has been refractory to medical therapy.  He has also had recurrent upper respiratory infections.  He denies any dysphagia or odynophagia.  He was evaluated by Dr. Cliffton Asters who felt the patient would benefit from repair of paraesophageal hernia.  The risks and benefits of the procedure were explained to the patient and he was agreeable to proceed.  Hospital Course:  Jasmin Trumbull presented to [facility] on 09/05/2023.  The patient was taken to the operating room and underwent Robotic Assisted Paraesophageal Hernia Repair, Fundoplication, and placement of right sided pigtail catheter.  The patient tolerated the procedure without difficulty, was extubated, and taken to the PACU in stable condition.  His chest xray was free from pneumothorax.  His pigtail catheter did not exhibit an air leak and was removed on POD #1.  Esophogram was performed and showed delayed esophageal motility.  He was started on a clear liquid diet.  He was hyperkalemic likely due to IV fluid administration and was given a dose of Lokelma.  His home antihypertensive medications were resumed as needed.  He is tolerating diet without difficulty.  He was advanced to a dysphagia diet and instructed on importance of continuing soft diet until Dr. Cliffton Asters advances.  He is passing gas and denies N/V.  He is ambulating.  His surgical incisions are free from evidence of infection.  He is medically stable for discharge home today.  Consults: None  Significant Diagnostic Studies:   Radiology:  Barium Esophogram  EXAM: ESOPHAGUS/BARIUM SWALLOW   TECHNIQUE: Single contrast examination was performed using water soluble contrast. This exam was performed by Sherlene Shams, PA-C, and was supervised and interpreted by Dr. Jacqualine Code.  FLUOROSCOPY: Radiation Exposure Index (as provided by the fluoroscopic device): 60.4 mGy Kerma   COMPARISON:  CT CHEST - 05/31/23   DG UGI W DOUBLE CM - 07/16/20    FINDINGS: Pharynx: Unremarkable.   Esophageal motility: Delayed passage of the contrast from the esophagus into the stomach with intermittent tertiary contractions and proximal escape of contrast.   Hiatal Hernia: None visualized. No extravasation visualized in the area of the fundoplication.   Gastroesophageal reflux: None visualized.   Other: None.   IMPRESSION: Delayed passage of contrast from the esophagus to the stomach with no visible extravasation.   Esophageal dysmotility.     Electronically Signed   By: Richarda Overlie M.D.   On: 09/06/2023 11:17  Treatments: surgery:   09/05/2023   Patient:  Raymond Stanley Pre-Op Dx: paraesophageal hernia   Post-op Dx:  same Procedure: - Esophagoscopy - Robotic assisted laparoscopy - Paraesophageal hernia repair with Ovitex mesh pledgets  - Nissen fundoplication - 65F Right pigtail chest tube placement     Surgeon and Role:      * Lightfoot, Eliezer Lofts, MD - Primary   Assistant: Jaclyn Prime, PA-C  An experienced assistant was required given the complexity of this surgery and the standard of surgical care. The assistant was needed for exposure, dissection, suctioning, retraction of delicate tissues and sutures, instrument exchange and for overall help during this procedure.    PATHOLOGY:  Discharge Exam: Blood pressure (!) 152/96, pulse 79, temperature 98.3 F (36.8 C), temperature source Oral, resp. rate 19, height 5\' 9"  (1.753 m), weight 84.2 kg, SpO2 94%.  Gen: NAD Heart: RRR Lungs; CTA bilaterally Abd; Soft non-tender, non-distended, + BS Incisions: C/D/I  Discharge disposition: 01-Home or Self Care   Allergies as of 09/07/2023       Reactions   Ace Inhibitors Anaphylaxis   Throat swelling   Crestor [rosuvastatin]    Shoulder pain   Carac [fluorouracil] Rash   Lipitor [atorvastatin] Other (See Comments)   Stated it "tore his shoulders up" with pain        Medication List     TAKE these medications     acetaminophen 325 MG tablet Commonly known as: Tylenol Take 2 tablets (650 mg total) by mouth every 4 (four) hours as needed.   albuterol (2.5 MG/3ML) 0.083% nebulizer solution Commonly known as: PROVENTIL Take 2.5 mg by nebulization 2 (two) times daily.   amLODipine 10 MG tablet Commonly known as: NORVASC Take 1 tablet (10 mg total) by mouth daily. What changed: how much to take   aspirin 81 MG tablet Take 81 mg by mouth daily.   budesonide 0.25 MG/2ML nebulizer solution Commonly known as: Pulmicort Take 2 mLs (0.25 mg total) by nebulization in the morning and at bedtime.   cholecalciferol 25 MCG (1000 UNIT) tablet Commonly known as: VITAMIN D3 Take 1,000 Units by mouth daily.   Coenzyme Q10 300 MG Caps Take 300 mg by mouth daily.   diclofenac sodium 1 % Gel Commonly known as: VOLTAREN Apply 2-4 g topically 4 (four) times daily. What changed:  when to take this reasons to take this   ezetimibe 10 MG tablet Commonly known as: ZETIA Take 1 tablet (10 mg total) by mouth daily.   famotidine 20 MG tablet Commonly known as: PEPCID Take 1 tablet (20 mg total) by mouth at bedtime.   losartan-hydrochlorothiazide 100-12.5 MG tablet Commonly known as: HYZAAR Take 0.5 tablets by mouth in the morning.   metoprolol succinate 25 MG 24  hr tablet Commonly known as: TOPROL-XL Take 1 tablet (25 mg total) by mouth daily.   nitroGLYCERIN 0.4 MG SL tablet Commonly known as: NITROSTAT Place 1 tablet (0.4 mg total) under the tongue every 5 (five) minutes as needed for chest pain.   omeprazole 40 MG capsule Commonly known as: PRILOSEC Take 40 mg by mouth daily.   ondansetron 4 MG tablet Commonly known as: Zofran Take 1 tablet (4 mg total) by mouth every 8 (eight) hours as needed for nausea or vomiting.   oxyCODONE 5 MG immediate release tablet Commonly known as: Roxicodone Take 1 tablet (5 mg total) by mouth every 4 (four) hours as needed for severe pain (pain score  7-10).   simvastatin 40 MG tablet Commonly known as: ZOCOR Take 1 tablet (40 mg total) by mouth daily.   temazepam 15 MG capsule Commonly known as: RESTORIL Take 15 mg by mouth at bedtime as needed for sleep.   terazosin 5 MG capsule Commonly known as: HYTRIN Take 5 mg by mouth at bedtime.   ticagrelor 90 MG Tabs tablet Commonly known as: BRILINTA Take 1 tablet (90 mg total) by mouth 2 (two) times daily.   Vitamin B12 1000 MCG Tbcr Take 1 capsule by mouth daily.        Follow-up Information     Corliss Skains, MD Follow up on 09/19/2023.   Specialty: Cardiothoracic Surgery Why: This a virtual/telephone appointment.  Dr. Cliffton Asters will call you at 4:00 Contact information: [address] [place] [address] [phone]                 Signed: Lowella Dandy, PA-C  09/07/2023, 7:43 AM

## 2023-09-06 NOTE — Progress Notes (Signed)
 Pt's K+ 5.9 this a.m. Notified Lightfoot, MD; no new orders at this time.   Bari Edward, RN

## 2023-09-06 NOTE — Progress Notes (Addendum)
      [address]       [place] [address]             [phone]       1 Day Post-Op Procedure(s) (LRB): REPAIR, HERNIA, PARAESOPHAGEAL, ROBOT-ASSISTED USING OVITEX REINFORCED TISSUE MATRIX (N/A) EGD (ESOPHAGOGASTRODUODENOSCOPY) (N/A)  Subjective:  Patient receiving breathing treatment.  Pain control could be better.  Denies N/V  Objective: Vital signs in last 24 hours: Temp:  [97.4 F (36.3 C)-98.2 F (36.8 C)] 98.2 F (36.8 C) (04/08 0800) Pulse Rate:  [81-99] 90 (04/08 0800) Cardiac Rhythm: Normal sinus rhythm (04/07 1900) Resp:  [11-20] 14 (04/08 0800) BP: (108-166)/(66-103) 139/79 (04/08 0800) SpO2:  [91 %-94 %] 94 % (04/08 0800)  Intake/Output from previous day: 04/07 0701 - 04/08 0700 In: 2000 [I.V.:1300; IV Piggyback:700] Out: 1368 [Urine:1350; Blood:10; Chest Tube:8] Intake/Output this shift: Total I/O In: -  Out: 120 [Urine:120]  General appearance: alert, cooperative, and no distress Heart: regular rate and rhythm Lungs: clear to auscultation bilaterally Abdomen: soft, non-tender; bowel sounds normal; no masses,  no organomegaly Extremities: extremities normal, atraumatic, no cyanosis or edema Wound: clean and dry  Lab Results: Recent Labs    09/05/23 1802  WBC 8.7  HGB 12.9*  HCT 39.0  PLT 160   BMET:  Recent Labs    09/06/23 0214  NA 137  K 5.9*  CL 102  CO2 24  GLUCOSE 142*  BUN 20  CREATININE 1.02  CALCIUM 9.7    PT/INR: No results for input(s): "LABPROT", "INR" in the last 72 hours. ABG    Component Value Date/Time   TCO2 20 (L) 02/23/2023 1212   CBG (last 3)  No results for input(s): "GLUCAP" in the last 72 hours.  Assessment/Plan: S/P Procedure(s) (LRB): REPAIR, HERNIA, PARAESOPHAGEAL, ROBOT-ASSISTED USING OVITEX REINFORCED TISSUE MATRIX (N/A) EGD (ESOPHAGOGASTRODUODENOSCOPY) (N/A)  CV- NSR, H/O HTN- continue prn Hydralazine, will resume oral medications if swallow study is clear Pulm- home  nebulizers ordered, pigtail in place with minimal bloody drainage no air leak.. CXR w/o pneumothorax,will d/c today Renal- creatinine remains stable, received IV hydration yesterday in OR, like cause of potassium being slightly high at 5.9, will give 1 dose of Lokelma once cleared by swallow study GI- NPO for now, swallow study ordered, will advance to dysphagia diet if swallow is cleared Activty- up and ambulating as tolerated   Dispo- swallow today, pain control should improve when we can add oral agents and chest tube is out... will plan to d/c in AM if pain control is improved and he is tolerating oral intake pending swallow   LOS: 0 days    Lowella Dandy, PA-C 09/06/2023

## 2023-09-06 NOTE — Anesthesia Postprocedure Evaluation (Signed)
 Anesthesia Post Note  Patient: ANDREA COLGLAZIER  Procedure(s) Performed: REPAIR, HERNIA, PARAESOPHAGEAL, ROBOT-ASSISTED USING OVITEX REINFORCED TISSUE MATRIX (Chest) EGD (ESOPHAGOGASTRODUODENOSCOPY)     Patient location during evaluation: PACU Anesthesia Type: General Level of consciousness: awake and alert Pain management: pain level controlled Vital Signs Assessment: post-procedure vital signs reviewed and stable Respiratory status: spontaneous breathing, nonlabored ventilation, respiratory function stable and patient connected to nasal cannula oxygen Cardiovascular status: blood pressure returned to baseline and stable Postop Assessment: no apparent nausea or vomiting Anesthetic complications: no   No notable events documented.  Last Vitals:  Vitals:   09/06/23 0431 09/06/23 0500  BP: (!) 165/98 128/76  Pulse:    Resp:    Temp:    SpO2:      Last Pain:  Vitals:   09/06/23 0624  TempSrc:   PainSc: 7                  Zuzanna Maroney P Asiel Chrostowski

## 2023-09-06 NOTE — TOC Initial Note (Addendum)
 Transition of Care [facility]) - Initial/Assessment Note    Patient Details  Name: Raymond Stanley MRN: [id number] Date of Birth: Jul 03, 1945  Transition of Care [facility]) CM/SW Contact:    Marliss Coots, LCSW Phone Number: 09/06/2023, 12:03 PM  Clinical Narrative:                  12:03 PM CSW introduced self and role to patient at bedside. Patient's daughter, Clydie Braun, was also present at bedside. Patient consented CSW to speak in front of Clydie Braun. CSW offered support and resources as needed. Patient confirmed he resides in a one story home with spouse. Patient confirmed family reside near residence and would be able to provide home support and transportation at discharge. No TOC needs identified at this time.  Expected Discharge Plan: Home/Self Care Barriers to Discharge: Continued Medical Work up   Patient Goals and CMS Choice Patient states their goals for this hospitalization and ongoing recovery are:: to return home          Expected Discharge Plan and Services       Living arrangements for the past 2 months: Single Family Home                                      Prior Living Arrangements/Services Living arrangements for the past 2 months: Single Family Home Lives with:: Spouse Patient language and need for interpreter reviewed:: Yes Do you feel safe going back to the place where you live?: Yes            Criminal Activity/Legal Involvement Pertinent to Current Situation/Hospitalization: No - Comment as needed  Activities of Daily Living   ADL Screening (condition at time of admission) Independently performs ADLs?: Yes (appropriate for developmental age) Is the patient deaf or have difficulty hearing?: Yes Does the patient have difficulty seeing, even when wearing glasses/contacts?: No Does the patient have difficulty concentrating, remembering, or making decisions?: No  Permission Sought/Granted Permission sought to share information with : Family  Supports Permission granted to share information with : Yes, Verbal Permission Granted  Share Information with NAME: Talyn Dessert     Permission granted to share info w Relationship: Daughter  Permission granted to share info w Contact Information: [phone]  Emotional Assessment Appearance:: Appears stated age Attitude/Demeanor/Rapport: Engaged Affect (typically observed): Accepting, Adaptable, Stable, Calm, Pleasant, Appropriate Orientation: : Oriented to Self, Oriented to Place, Oriented to  Time, Oriented to Situation Alcohol / Substance Use: Not Applicable Psych Involvement: No (comment)  Admission diagnosis:  Paraesophageal hernia [K44.9] S/P repair of paraesophageal hernia [W29.562, Z87.19] Patient Active Problem List   Diagnosis Date Noted   Paraesophageal hernia 09/05/2023   S/P repair of paraesophageal hernia 09/05/2023   BPH (benign prostatic hyperplasia) 06/22/2023   HTN (hypertension) 06/22/2023   Hypertension 02/25/2023   Hyperlipidemia 02/25/2023   STEMI involving right coronary artery (HCC) 02/23/2023   Left ventricular dysfunction with reduced left ventricular function 10/30/2020   Neck mass 08/19/2020   GERD (gastroesophageal reflux disease) 07/10/2020   Hiatal hernia 07/10/2020   Pleuritic pain 07/10/2020   Aortic atherosclerosis (HCC) 06/05/2020   Left-sided chest wall pain 06/05/2020   Unilateral primary osteoarthritis, left knee 09/22/2016   PCP:  Emilio Aspen, MD Pharmacy:   [id number] [address] ([address]), [place] - [address] [place] ([place]) [place] [address] Phone: [phone] Fax: [phone]  [facility] - [address] [place] [address] Phone: [phone] Fax: [phone]  [facility] [address] [place] [address] Phone: [phone] Fax: [phone]  [facility] - [place] - [address] [place] [address] Phone: [phone] Fax: [phone]     Social Drivers of Health (SDOH) Social History: SDOH Screenings   Food Insecurity: No Food Insecurity (09/05/2023)  Housing: Low Risk  (09/05/2023)  Transportation Needs: No Transportation Needs (09/05/2023)  Utilities: Not At Risk (09/05/2023)  Social Connections: Moderately Integrated (09/05/2023)  Tobacco Use: Low Risk  (09/05/2023)   SDOH Interventions:     Readmission Risk Interventions     No data to display

## 2023-09-06 NOTE — Discharge Instructions (Signed)
 Discharge Instructions:   DIET: Please stay on soft/babyfood consistency diet for minimum of 1 week..  1. You may shower, please wash incisions daily with soap and water and keep dry.  If you wish to cover wounds with dressing you may do so but please keep clean and change daily.  No tub baths or swimming until incisions have completely healed.  If your incisions become red or develop any drainage please call our office at [phone]  2. No Driving until cleared by Dr. Lucilla Lame office and you are no longer using narcotic pain medications  3. Fever of 101.5 for at least 24 hours with no source, please contact our office at [phone]  4. Activity- up as tolerated, please walk at least 3 times per day.  Avoid strenuous activity  5. If any questions or concerns arise, please do not hesitate to contact our office at [phone]

## 2023-09-07 ENCOUNTER — Inpatient Hospital Stay (HOSPITAL_COMMUNITY)

## 2023-09-07 ENCOUNTER — Other Ambulatory Visit (HOSPITAL_COMMUNITY): Payer: Self-pay

## 2023-09-07 LAB — BASIC METABOLIC PANEL WITH GFR
Anion gap: 8 (ref 5–15)
BUN: 17 mg/dL (ref 8–23)
CO2: 28 mmol/L (ref 22–32)
Calcium: 9.3 mg/dL (ref 8.9–10.3)
Chloride: 102 mmol/L (ref 98–111)
Creatinine, Ser: 0.85 mg/dL (ref 0.61–1.24)
GFR, Estimated: >60 mL/min
Glucose, Bld: 106 mg/dL — ABNORMAL HIGH (ref 70–99)
Potassium: 4.2 mmol/L (ref 3.5–5.1)
Sodium: 138 mmol/L (ref 135–145)

## 2023-09-07 MED ORDER — ACETAMINOPHEN 325 MG PO TABS: 650.0000 mg | ORAL_TABLET | ORAL | Status: AC | PRN

## 2023-09-07 MED ORDER — ONDANSETRON HCL 4 MG PO TABS
4.0000 mg | ORAL_TABLET | Freq: Three times a day (TID) | ORAL | 0 refills | Status: AC | PRN
  Filled 2023-09-07: qty 60, 20d supply, fill #0

## 2023-09-07 MED ORDER — OXYCODONE HCL 5 MG PO TABS
5.0000 mg | ORAL_TABLET | ORAL | 0 refills | Status: AC | PRN
  Filled 2023-09-07: qty 30, 5d supply, fill #0

## 2023-09-07 NOTE — Plan of Care (Signed)

## 2023-09-07 NOTE — TOC Transition Note (Signed)
 Transition of Care [facility]) - Discharge Note   Patient Details  Name: Raymond Stanley MRN: [id number] Date of Birth: 08/04/1945  Transition of Care [facility]) CM/SW Contact:  Harriet Masson, RN Phone Number: 09/07/2023, 12:32 PM   Clinical Narrative:    Patient stable to discharge home.  No TOC needs at this time.    Final next level of care: Home/Self Care Barriers to Discharge: Barriers Resolved   Patient Goals and CMS Choice Patient states their goals for this hospitalization and ongoing recovery are:: to return home          Discharge Placement  Home                     Discharge Plan and Services Additional resources added to the After Visit Summary for                                       Social Drivers of Health (SDOH) Interventions SDOH Screenings   Food Insecurity: No Food Insecurity (09/05/2023)  Housing: Low Risk  (09/05/2023)  Transportation Needs: No Transportation Needs (09/05/2023)  Utilities: Not At Risk (09/05/2023)  Social Connections: Moderately Integrated (09/05/2023)  Tobacco Use: Low Risk  (09/05/2023)     Readmission Risk Interventions     No data to display

## 2023-09-07 NOTE — Progress Notes (Addendum)
 2 Days Post-Op Procedure(s) (LRB): REPAIR, HERNIA, PARAESOPHAGEAL, ROBOT-ASSISTED USING OVITEX REINFORCED TISSUE MATRIX (N/A) EGD (ESOPHAGOGASTRODUODENOSCOPY) (N/A) Subjective: Mr> Dasaro is alert and oriented sitting upright on his bed. He states his pain is well controlled with a complaint of slight cough. He tolerated his liquid diet and has been upgraded to a dysphagia 1 diet which he is tolerating well also with no complains of n/v.  He has states he has not has BM yet but has passed gas.  Objective: Vital signs in last 24 hours: Temp:  [97.6 F (36.4 C)-98.5 F (36.9 C)] 98.3 F (36.8 C) (04/09 0400) Pulse Rate:  [69-93] 79 (04/09 0400) Cardiac Rhythm: Normal sinus rhythm (04/08 1900) Resp:  [14-20] 19 (04/09 0400) BP: (139-157)/(79-96) 152/96 (04/09 0400) SpO2:  [91 %-96 %] 94 % (04/09 0400)  Hemodynamic parameters for last 24 hours:    Intake/Output from previous day: 04/08 0701 - 04/09 0700 In: -  Out: 3260 [Urine:3260] Intake/Output this shift: No intake/output data recorded.  General appearance: alert, cooperative, and no distress Neurologic: intact Heart: regular rate and rhythm Lungs: clear to auscultation bilaterally Abdomen: soft, non-tender; bowel sounds normal; no masses,  no organomegaly Extremities: extremities normal, atraumatic, no cyanosis or edema Wound: no signs of infection   Lab Results: Recent Labs    09/05/23 1802  WBC 8.7  HGB 12.9*  HCT 39.0  PLT 160   BMET:  Recent Labs    09/06/23 0214 09/07/23 0217  NA 137 138  K 5.9* 4.2  CL 102 102  CO2 24 28  GLUCOSE 142* 106*  BUN 20 17  CREATININE 1.02 0.85  CALCIUM 9.7 9.3    PT/INR: No results for input(s): "LABPROT", "INR" in the last 72 hours. ABG    Component Value Date/Time   TCO2 20 (L) 02/23/2023 1212   CBG (last 3)  No results for input(s): "GLUCAP" in the last 72 hours.  Assessment/Plan: S/P Procedure(s) (LRB): REPAIR, HERNIA, PARAESOPHAGEAL, ROBOT-ASSISTED USING  OVITEX REINFORCED TISSUE MATRIX (N/A) EGD (ESOPHAGOGASTRODUODENOSCOPY) (N/A)  Neuro: he is alert and oriented with no distress Cardio: NSR, history of HTN, restarted on his home amlodipine Pulm: on room air, pigtail removed, CXR with no sign of pneumothorax. He can take OTC cough medicine for sx management.   GI: upgraded to dysphagia 1 diet, no n/v. He has not had a BM yet which is expected but has passed gas. He can take stool softeners PRN for his constipation  Renal: potassium have trended down from yesterday from 5.9 - 4.2. other electrolytes are WNL Wound: his incision site are healing well with no signs of infection or erythema Dispo: plan to discharge him today.    LOS: 1 day    [facility] 09/07/2023  Patient doing well.  He is tolerating oral intake w/o nausea and vomiting.  He isn't sleeping very well and is really wants to go home today.  Gen: NAD Heart: RRR Lungs; CTA bilaterally Abd; Soft non-tender, non-distended, + BS Incisions: C/D/I  A/P:  CV- NSR, H/O HTN- resuming home medications Pulm- CXR w/o pneumothorax post chest tube removal Hyperkalemia- resolved after Lokelma, down to 4.2 GI- advance to dysphagia diet, patient educated on importance of sticking to diet until advanced by Dr. Mady Gemma- patient stable, will d/c home today  Agree with Above PA Student  Lowella Dandy, PA-C 8:07 AM 09/07/23

## 2023-09-19 ENCOUNTER — Ambulatory Visit (INDEPENDENT_AMBULATORY_CARE_PROVIDER_SITE_OTHER): Payer: Self-pay | Admitting: Thoracic Surgery (Cardiothoracic Vascular Surgery)

## 2023-09-19 DIAGNOSIS — K449 Diaphragmatic hernia without obstruction or gangrene: Secondary | ICD-10-CM

## 2023-09-19 NOTE — Progress Notes (Signed)
     [address]       [place] [address]             [phone]       Patient: Home Provider: Office Consent for Telemedicine visit obtained.  Today's visit was completed via a real-time telehealth (see specific modality noted below). The patient/authorized person provided oral consent at the time of the visit to engage in a telemedicine encounter with the present provider at [facility]. The patient/authorized person was informed of the potential benefits, limitations, and risks of telemedicine. The patient/authorized person expressed understanding that the laws that protect confidentiality also apply to telemedicine. The patient/authorized person acknowledged understanding that telemedicine does not provide emergency services and that he or she would need to call 911 or proceed to the nearest hospital for help if such a need arose.   Total time spent in the clinical discussion 10 minutes.  Telehealth Modality: Phone visit (audio only)  I had a telephone visit with [name].  Overall, he is doing well.  He denies any dysphagia or reflux, but he has been taking his ant-acids.  He is tolerating a regular diet without any problems.  He will follow-up in 1 month.  Lanea Vankirk [place]

## 2023-09-23 ENCOUNTER — Encounter: Payer: Self-pay | Admitting: Pulmonary Disease

## 2023-10-06 DIAGNOSIS — E559 Vitamin D deficiency, unspecified: Secondary | ICD-10-CM | POA: Diagnosis not present

## 2023-10-06 DIAGNOSIS — J479 Bronchiectasis, uncomplicated: Secondary | ICD-10-CM | POA: Diagnosis not present

## 2023-10-06 DIAGNOSIS — I10 Essential (primary) hypertension: Secondary | ICD-10-CM | POA: Diagnosis not present

## 2023-10-06 DIAGNOSIS — Z125 Encounter for screening for malignant neoplasm of prostate: Secondary | ICD-10-CM | POA: Diagnosis not present

## 2023-10-06 DIAGNOSIS — G4700 Insomnia, unspecified: Secondary | ICD-10-CM | POA: Diagnosis not present

## 2023-10-06 DIAGNOSIS — R053 Chronic cough: Secondary | ICD-10-CM | POA: Diagnosis not present

## 2023-10-06 DIAGNOSIS — E7800 Pure hypercholesterolemia, unspecified: Secondary | ICD-10-CM | POA: Diagnosis not present

## 2023-10-06 DIAGNOSIS — N400 Enlarged prostate without lower urinary tract symptoms: Secondary | ICD-10-CM | POA: Diagnosis not present

## 2023-10-06 DIAGNOSIS — K9089 Other intestinal malabsorption: Secondary | ICD-10-CM | POA: Diagnosis not present

## 2023-10-06 DIAGNOSIS — I2510 Atherosclerotic heart disease of native coronary artery without angina pectoris: Secondary | ICD-10-CM | POA: Diagnosis not present

## 2023-10-06 DIAGNOSIS — R0602 Shortness of breath: Secondary | ICD-10-CM | POA: Diagnosis not present

## 2023-10-06 DIAGNOSIS — Z0000 Encounter for general adult medical examination without abnormal findings: Secondary | ICD-10-CM | POA: Diagnosis not present

## 2023-10-06 DIAGNOSIS — Z1211 Encounter for screening for malignant neoplasm of colon: Secondary | ICD-10-CM | POA: Diagnosis not present

## 2023-10-06 DIAGNOSIS — Z79899 Other long term (current) drug therapy: Secondary | ICD-10-CM | POA: Diagnosis not present

## 2023-10-06 DIAGNOSIS — K449 Diaphragmatic hernia without obstruction or gangrene: Secondary | ICD-10-CM | POA: Diagnosis not present

## 2023-10-17 DIAGNOSIS — Z1212 Encounter for screening for malignant neoplasm of rectum: Secondary | ICD-10-CM | POA: Diagnosis not present

## 2023-10-17 DIAGNOSIS — Z1211 Encounter for screening for malignant neoplasm of colon: Secondary | ICD-10-CM | POA: Diagnosis not present

## 2023-10-18 ENCOUNTER — Other Ambulatory Visit: Payer: Self-pay | Admitting: Thoracic Surgery (Cardiothoracic Vascular Surgery)

## 2023-10-18 DIAGNOSIS — K449 Diaphragmatic hernia without obstruction or gangrene: Secondary | ICD-10-CM

## 2023-10-21 ENCOUNTER — Encounter: Payer: Self-pay | Admitting: Thoracic Surgery (Cardiothoracic Vascular Surgery)

## 2023-10-21 ENCOUNTER — Ambulatory Visit (INDEPENDENT_AMBULATORY_CARE_PROVIDER_SITE_OTHER): Payer: Self-pay | Admitting: Thoracic Surgery (Cardiothoracic Vascular Surgery)

## 2023-10-21 ENCOUNTER — Ambulatory Visit
Admission: RE | Admit: 2023-10-21 | Discharge: 2023-10-21 | Disposition: A | Discharge status: Home / Self Care | Payer: Self-pay | Source: Ambulatory Visit | Attending: Thoracic Surgery (Cardiothoracic Vascular Surgery) | Admitting: Thoracic Surgery (Cardiothoracic Vascular Surgery)

## 2023-10-21 VITALS — BP 112/75 | HR 85 | Resp 20 | Ht 69.0 in | Wt 175.7 lb

## 2023-10-21 DIAGNOSIS — I771 Stricture of artery: Secondary | ICD-10-CM | POA: Diagnosis not present

## 2023-10-21 DIAGNOSIS — Z8719 Personal history of other diseases of the digestive system: Secondary | ICD-10-CM | POA: Insufficient documentation

## 2023-10-21 DIAGNOSIS — K449 Diaphragmatic hernia without obstruction or gangrene: Secondary | ICD-10-CM | POA: Diagnosis not present

## 2023-10-21 DIAGNOSIS — Z9889 Other specified postprocedural states: Secondary | ICD-10-CM

## 2023-10-21 NOTE — Progress Notes (Signed)
      [address]       [place] [address]             [phone]        KRISTION HOLIFIELD [facility] Medical Record #[id number] Date of Birth: 02-17-1946  Referring: Wilfredo Hanly, MD Primary Care: Benedetta Bradley, MD Primary Cardiologist:Jay Berry Bristol, MD  Reason for visit:   follow-up  History of Present Illness:     Raymond Stanley present for his 1 month follow-up appointment.  He states that he is doing well.  He is eating normal food, and denies any dysphagia or reflux.  Physical Exam: BP 112/75 (BP Location: Left Arm, Patient Position: Sitting, Cuff Size: Normal)   Pulse 85   Resp 20   Ht 5\' 9"  (1.753 m)   Wt 175 lb 11.2 oz (79.7 kg)   SpO2 96% Comment: RA  BMI 25.95 kg/m   Alert NAD Abdomen, ND No peripheral edema   Diagnostic Studies & Laboratory data: CXR:  Clear     Assessment / Plan:   78 y.o. male s/p paraesophageal hernia repair with fundoplication.  Overall he is doing well.  He will follow-up as needed.   Hilarie Lovely 10/21/2023 3:06 PM

## 2023-10-23 ENCOUNTER — Other Ambulatory Visit: Payer: Self-pay | Admitting: Pulmonary Disease

## 2023-10-25 ENCOUNTER — Ambulatory Visit (INDEPENDENT_AMBULATORY_CARE_PROVIDER_SITE_OTHER): Payer: Medicare Other | Admitting: Pulmonary Disease

## 2023-10-25 ENCOUNTER — Encounter: Payer: Self-pay | Admitting: Pulmonary Disease

## 2023-10-25 VITALS — BP 139/92 | HR 70 | Ht 69.0 in | Wt 174.0 lb

## 2023-10-25 DIAGNOSIS — K449 Diaphragmatic hernia without obstruction or gangrene: Secondary | ICD-10-CM | POA: Diagnosis not present

## 2023-10-25 DIAGNOSIS — K219 Gastro-esophageal reflux disease without esophagitis: Secondary | ICD-10-CM

## 2023-10-25 DIAGNOSIS — J4530 Mild persistent asthma, uncomplicated: Secondary | ICD-10-CM | POA: Diagnosis not present

## 2023-10-25 DIAGNOSIS — R0602 Shortness of breath: Secondary | ICD-10-CM | POA: Diagnosis not present

## 2023-10-25 DIAGNOSIS — J4520 Mild intermittent asthma, uncomplicated: Secondary | ICD-10-CM | POA: Diagnosis not present

## 2023-10-25 NOTE — Patient Instructions (Addendum)
 Ok to stop budesonide  nebulizer treatments  Continue albuterol  nebulizer as needed, every 4-6 hours  Monitor for return of cough or wheezing after stopping budesonide   Follow up in 6 months, call sooner if needed

## 2023-10-25 NOTE — Progress Notes (Addendum)
 Synopsis: Referred in December 2024 for dyspnea by Gordy Bergamo, MD  Subjective:   PATIENT ID: Raymond Stanley GENDER: male DOB: Feb 28, 1946, MRN: [id number]  HPI  Chief Complaint  Patient presents with   Follow-up    F/U - No symptoms   Raymond Stanley is a 78 year old male, never smoker with history of hypertension, CAD, GERD and pulmonary emboli who returns to pulmonary clinic for shortness of breath.   Initial OV 05/12/2023 He was seen by Dr. Bergamo 03/04/23, note reviewed, where he was noted to complain of dyspnea for several years. He reported exposure to asbestos in the past. He was evaluated by Dr. Geronimo of pulmonary in 2019. HRCT Chest did not indicate findings of ILD but did show air trapping. He had mild diffusion defect on PFTs in 2019. PFTs from 2023 at [facility] clinic are within normal limits. No ERV available for review. Most recent Echo 03/2023 shows LV EF 45-50% (has been 40% in past), Grade I diastolic dysfunction. Normal RV function and mildly enlarged RV. Dilated left and right atria.   The patient, with a history of hiatal hernia and recent heart attack, presents with a chief complaint of progressive shortness of breath over the past four to five years. Initially, the shortness of breath was only noticeable during physical exertion, such as working outside. However, it has progressively worsened to the point where even walking from the bedroom to the kitchen or up a flight of stairs causes significant breathlessness. The patient also reports increased frequency of acid reflux, despite taking omeprazole 40mg  daily, sometimes twice a day if anticipating a late or spicy meal. The patient sleeps with three pillows to alleviate reflux symptoms but still experiences episodes approximately once a week. The patient has seen multiple pulmonologists over the past five years, with suggested causes for the symptoms ranging from acid reflux damage to the lungs to asbestosis. The patient believes  the hiatal hernia is the primary cause of the shortness of breath.  OV 07/27/23 He experienced a respiratory illness towards the end of last month, initially diagnosed as bronchitis and a possible sinus infection at an urgent care visit. He was prescribed doxycycline  for ten days, which did not alleviate his symptoms. Subsequently, he self-administered amoxicillin for a week without improvement. Upon visiting his primary care physician, he requested a chest x-ray, which revealed pneumonia based on lateral view. He was treated with a Z-Pak and another antibiotic, leading to improvement within two days.  He has a persistent dry cough, described as nonproductive and lingering. He notes a long-standing dry cough even before the pneumonia diagnosis. No episodes of choking or aspiration, and no fever or chills during the illness. His appetite is fair, but energy levels have been low for a long time. He experiences discomfort in his back along the shoulder blade and on the left side when coughing, but no pain on the right side.   He is preparing for surgery to address a hiatal hernia, which is expected to improve his breathing. He has been using albuterol  through a nebulizer three times a day during the pneumonia episode, which provided some relief, especially at night. He has not used it in the last four to five days as he feels the pneumonia is under control. He is currently taking omeprazole 40 mg in the morning and Pepcid  at night, which has effectively managed his heartburn and reflux symptoms.  He mentions taking his grandson to a monster truck event, which he speculates  might have contributed to his recent illness. He has a history of attending such events with his son, who has passed away, and now continues the tradition with his grandson.  OV 10/25/23 He experiences improved but some persistent shortness of breath since surgery. Severe dyspnea with minimal exertion has resolved. He uses nebulizer  treatments with budesonide  and albuterol , with a two-week supply remaining, but lacks an albuterol  inhaler. No wheezing is present, and his nonproductive cough has resolved. During hospitalization, a chest tube was placed. A swallow study indicated material not descending as expected, leading to a sensation of food or drink getting stuck. He manages this by taking small sips and remaining upright after meals.    Past Medical History:  Diagnosis Date   Actinic keratoses    BPH (benign prostatic hypertrophy)    Dysrhythmia    ED (erectile dysfunction)    Esophageal reflux    Hearing loss    Myocardial infarction (HCC)    Other and unspecified hyperlipidemia    Premature atrial contractions    Pulmonary emboli (HCC)    Skin cancer    Unspecified essential hypertension      Family History  Problem Relation Age of Onset   Hypertension Mother    CAD Mother    Hypertension Father    CAD Father    Heart disease Brother    Hypertension Brother    Alzheimer's disease Brother    Hypertension Maternal Grandmother    CAD Maternal Grandmother    Asthma Daughter      Social History   Socioeconomic History   Marital status: Married    Spouse name: Not on file   Number of children: Not on file   Years of education: Not on file   Highest education level: Not on file  Occupational History   Not on file  Tobacco Use   Smoking status: Never    Passive exposure: Never   Smokeless tobacco: Never  Vaping Use   Vaping status: Never Used  Substance and Sexual Activity   Alcohol use: Yes    Comment: Rarely   Drug use: No   Sexual activity: Not on file  Other Topics Concern   Not on file  Social History Narrative   Wife has dementia   Social Drivers of Corporate investment banker Strain: Not on file  Food Insecurity: No Food Insecurity (09/05/2023)   Hunger Vital Sign    Worried About Running Out of Food in the Last Year: Never true    Ran Out of Food in the Last Year: Never true   Transportation Needs: No Transportation Needs (09/05/2023)   PRAPARE - Administrator, Civil Service (Medical): No    Lack of Transportation (Non-Medical): No  Physical Activity: Not on file  Stress: Not on file  Social Connections: Moderately Integrated (09/05/2023)   Social Connection and Isolation Panel    Frequency of Communication with Friends and Family: More than three times a week    Frequency of Social Gatherings with Friends and Family: Three times a week    Attends Religious Services: More than 4 times per year    Active Member of Clubs or Organizations: No    Attends Banker Meetings: Never    Marital Status: Married  Catering manager Violence: Not At Risk (09/05/2023)   Humiliation, Afraid, Rape, and Kick questionnaire    Fear of Current or Ex-Partner: No    Emotionally Abused: No    Physically Abused:  No    Sexually Abused: No     Allergies  Allergen Reactions   Ace Inhibitors Anaphylaxis    Throat swelling   Crestor [Rosuvastatin]     Shoulder pain   Carac [Fluorouracil] Rash   Lipitor [Atorvastatin ] Other (See Comments)    Stated it tore his shoulders up with pain     Outpatient Medications Prior to Visit  Medication Sig Dispense Refill   acetaminophen  (TYLENOL ) 325 MG tablet Take 2 tablets (650 mg total) by mouth every 4 (four) hours as needed.     amLODipine  (NORVASC ) 10 MG tablet Take 1 tablet (10 mg total) by mouth daily. (Patient taking differently: Take 5 mg by mouth daily.) 90 tablet 3   aspirin  81 MG tablet Take 81 mg by mouth daily.     cholecalciferol (VITAMIN D3) 25 MCG (1000 UNIT) tablet Take 1,000 Units by mouth daily.     Coenzyme Q10 300 MG CAPS Take 300 mg by mouth daily.     Cyanocobalamin  (VITAMIN B12) 1000 MCG TBCR Take 1 capsule by mouth daily.     diclofenac  sodium (VOLTAREN ) 1 % GEL Apply 2-4 g topically 4 (four) times daily. (Patient taking differently: Apply 2-4 g topically daily as needed (artheritis).) 15 Tube 4    ezetimibe  (ZETIA ) 10 MG tablet Take 1 tablet (10 mg total) by mouth daily. 90 tablet 3   famotidine  (PEPCID ) 20 MG tablet TAKE 1 TABLET BY MOUTH AT  BEDTIME 90 tablet 3   losartan -hydrochlorothiazide (HYZAAR) 100-12.5 MG tablet Take 0.5 tablets by mouth in the morning.     metoprolol  succinate (TOPROL -XL) 25 MG 24 hr tablet Take 1 tablet (25 mg total) by mouth daily. 90 tablet 3   nitroGLYCERIN  (NITROSTAT ) 0.4 MG SL tablet Place 1 tablet (0.4 mg total) under the tongue every 5 (five) minutes as needed for chest pain. 25 tablet 1   ondansetron  (ZOFRAN ) 4 MG tablet Take 1 tablet (4 mg total) by mouth every 8 (eight) hours as needed for nausea or vomiting. 90 tablet 0   oxyCODONE  (ROXICODONE ) 5 MG immediate release tablet Take 1 tablet (5 mg total) by mouth every 4 (four) hours as needed for severe pain (pain score 7-10). 30 tablet 0   simvastatin  (ZOCOR ) 40 MG tablet Take 1 tablet (40 mg total) by mouth daily. 90 tablet 3   temazepam  (RESTORIL ) 15 MG capsule Take 15 mg by mouth at bedtime as needed for sleep.     terazosin (HYTRIN) 5 MG capsule Take 5 mg by mouth at bedtime.     ticagrelor  (BRILINTA ) 90 MG TABS tablet Take 1 tablet (90 mg total) by mouth 2 (two) times daily. 180 tablet 3   albuterol  (PROVENTIL ) (2.5 MG/3ML) 0.083% nebulizer solution Take 2.5 mg by nebulization 2 (two) times daily.     budesonide  (PULMICORT ) 0.25 MG/2ML nebulizer solution Take 2 mLs (0.25 mg total) by nebulization in the morning and at bedtime. 360 mL 3   No facility-administered medications prior to visit.    Review of Systems  Constitutional:  Negative for chills, fever, malaise/fatigue and weight loss.  HENT:  Negative for congestion, sinus pain and sore throat.   Eyes: Negative.   Respiratory:  Positive for shortness of breath. Negative for cough, hemoptysis, sputum production and wheezing.   Cardiovascular:  Negative for chest pain, palpitations, orthopnea, claudication and leg swelling.   Gastrointestinal:  Negative for abdominal pain, heartburn, nausea and vomiting.  Genitourinary: Negative.   Musculoskeletal:  Negative for joint pain and  myalgias.  Skin:  Negative for rash.  Neurological:  Negative for weakness.  Endo/Heme/Allergies: Negative.   Psychiatric/Behavioral: Negative.     Objective:   Vitals:   10/25/23 1309  BP: (!) 139/92  Pulse: 70  SpO2: 99%  Weight: 174 lb (78.9 kg)  Height: 5' 9 (1.753 m)    Physical Exam Constitutional:      General: He is not in acute distress.    Appearance: Normal appearance.   Eyes:     General: No scleral icterus.    Conjunctiva/sclera: Conjunctivae normal.    Cardiovascular:     Rate and Rhythm: Normal rate and regular rhythm.  Pulmonary:     Breath sounds: No wheezing, rhonchi or rales.   Musculoskeletal:     Right lower leg: No edema.     Left lower leg: No edema.   Skin:    General: Skin is warm and dry.   Neurological:     General: No focal deficit present.     CBC    Component Value Date/Time   WBC 8.7 09/05/2023 1802   RBC 4.27 09/05/2023 1802   HGB 12.9 (L) 09/05/2023 1802   HGB 13.3 03/25/2023 0912   HCT 39.0 09/05/2023 1802   HCT 41.7 03/25/2023 0912   PLT 160 09/05/2023 1802   PLT 156 03/25/2023 0912   MCV 91.3 09/05/2023 1802   MCV 94 03/25/2023 0912   MCH 30.2 09/05/2023 1802   MCHC 33.1 09/05/2023 1802   RDW 13.9 09/05/2023 1802   RDW 13.2 03/25/2023 0912   LYMPHSABS 2.4 02/23/2023 1145   MONOABS 0.5 02/23/2023 1145   EOSABS 0.1 02/23/2023 1145   BASOSABS 0.0 02/23/2023 1145      Latest Ref Rng & Units 09/07/2023    2:17 AM 09/06/2023    2:14 AM 09/01/2023    1:57 PM  BMP  Glucose 70 - 99 mg/dL 893  857  897   BUN 8 - 23 mg/dL 17  20  21    Creatinine 0.61 - 1.24 mg/dL 9.14  8.97  8.76   Sodium 135 - 145 mmol/L 138  137  141   Potassium 3.5 - 5.1 mmol/L 4.2  5.9  4.1   Chloride 98 - 111 mmol/L 102  102  105   CO2 22 - 32 mmol/L 28  24  26    Calcium  8.9 - 10.3 mg/dL 9.3   9.7  9.1    Chest imaging: CXR 07/13/23 1. Density overlying the posterior lower lobes on the lateral view is not clearly located on the PA view. This could reflect atelectasis or pneumonia.  CT Chest 09/29/20 Mediastinum/Nodes: Thoracic inlet is within normal limits. No sizable hilar or mediastinal adenopathy is noted. The esophagus as visualized is within normal limits. A moderate-sized hiatal hernia is noted however.   Lungs/Pleura: Lungs are well aerated bilaterally. Mild right basilar atelectasis is seen. No focal confluent infiltrate is noted. No sizable parenchymal nodule is seen. No effusions are seen.  PFT:    Latest Ref Rng & Units 02/15/2018   10:56 AM  PFT Results  FVC-Pre L 3.20   FVC-Predicted Pre % 76   FVC-Post L 3.06   FVC-Predicted Post % 73   Pre FEV1/FVC % % 76   Post FEV1/FCV % % 82   FEV1-Pre L 2.43   FEV1-Predicted Pre % 79   FEV1-Post L 2.50   DLCO uncorrected ml/min/mmHg 20.80   DLCO UNC% % 67   DLVA Predicted % 90  TLC L 6.08   TLC % Predicted % 89   RV % Predicted % 110    2023 [facility] SPIROMETRY: FVC was 3.22 liters, 83% of predicted FEV1 was 2.50, 86% of predicted FEV1 ratio was 77.61 FEF 25-75% liters per second was 102% of predicted  LUNG VOLUMES: TLC was 84% of predicted RV was 99% of predicted  DIFFUSION CAPACITY: DLCO was 97% of predicted DLCO/VA was 105% of predicted   Labs:  Path:  Echo 03/2023:  1. Left ventricular ejection fraction, by estimation, is 45 to 50%. The  left ventricle has mildly decreased function. The left ventricle  demonstrates regional wall motion abnormalities with inferoseptal and  inferior hypokinesis. Left ventricular  diastolic parameters are consistent with Grade I diastolic dysfunction  (impaired relaxation).   2. Right ventricular systolic function is normal. The right ventricular  size is mildly enlarged. There is normal pulmonary artery systolic  pressure. The estimated right  ventricular systolic pressure is 27.6 mmHg.   3. Left atrial size was mild to moderately dilated.   4. Right atrial size was mildly dilated.   5. The mitral valve is normal in structure. Trivial mitral valve  regurgitation. No evidence of mitral stenosis.   6. The aortic valve is tricuspid. There is moderate calcification of the  aortic valve. Aortic valve regurgitation is not visualized. Aortic valve  sclerosis/calcification is present, without any evidence of aortic  stenosis.   7. The inferior vena cava is normal in size with greater than 50%  respiratory variability, suggesting right atrial pressure of 3 mmHg.   Heart Catheterization 01/2023: LVEDP 23 mmHg      Prox LAD to Mid LAD lesion is 50% stenosed.   Mid LAD lesion is 60% stenosed.   1st Sept lesion is 50% stenosed.   Ost Cx to Prox Cx lesion is 30% stenosed.   Mid Cx lesion is 40% stenosed.   3rd Mrg lesion is 40% stenosed.   RPDA-2 lesion is 100% stenosed.   Dist RCA lesion is 30% stenosed.   RPDA-1 lesion is 30% stenosed.   Mid RCA lesion is 85% stenosed.   Prox RCA to Mid RCA lesion is 75% stenosed.   Ost RCA lesion is 20% stenosed.   A drug-eluting stent was successfully placed.   Post intervention, there is a 80% residual stenosis.   Post intervention, there is a 0% residual stenosis.   Post intervention, there is a 0% residual stenosis.   Recommend uninterrupted dual antiplatelet therapy with Aspirin  81mg  daily and Ticagrelor  90mg  twice daily for a minimum of 12 months (ACS-Class I recommendation). Assessment & Plan:   Mild persistent asthma without complication  Gastroesophageal reflux disease without esophagitis  Hiatal hernia  Shortness of breath  Discussion: Raymond Stanley is a 78 year old male, never smoker with history of hypertension, CAD, GERD and pulmonary emboli who returns to pulmonary clinic for shortness of breath.   Mild Persistent Asthma - Use budesonide  nebulizer as needed  Shortness of  breath Shortness of breath significantly improved post-surgery. Discontinue budesonide  nebulizer; continue albuterol  nebulizer as needed. - Continue albuterol  nebulizer as needed for dyspnea.  Hiatal Hernia s/p repair - GERD and cough are much improved  Post-surgical esophageal dysmotility Post-surgical esophageal dysmotility with dysphagia and delayed esophageal transit.  - Advise small sips or bites and allow time between swallows. - Instruct to remain upright for at least 30 minutes postprandially. - Contact surgeon if reflux or regurgitation symptoms worsen.  Follow up in 6  months  Dorn Chill, MD [facility] Office: [phone]   Current Outpatient Medications:    acetaminophen  (TYLENOL ) 325 MG tablet, Take 2 tablets (650 mg total) by mouth every 4 (four) hours as needed., Disp: , Rfl:    amLODipine  (NORVASC ) 10 MG tablet, Take 1 tablet (10 mg total) by mouth daily. (Patient taking differently: Take 5 mg by mouth daily.), Disp: 90 tablet, Rfl: 3   aspirin  81 MG tablet, Take 81 mg by mouth daily., Disp: , Rfl:    cholecalciferol (VITAMIN D3) 25 MCG (1000 UNIT) tablet, Take 1,000 Units by mouth daily., Disp: , Rfl:    Coenzyme Q10 300 MG CAPS, Take 300 mg by mouth daily., Disp: , Rfl:    Cyanocobalamin  (VITAMIN B12) 1000 MCG TBCR, Take 1 capsule by mouth daily., Disp: , Rfl:    diclofenac  sodium (VOLTAREN ) 1 % GEL, Apply 2-4 g topically 4 (four) times daily. (Patient taking differently: Apply 2-4 g topically daily as needed (artheritis).), Disp: 15 Tube, Rfl: 4   ezetimibe  (ZETIA ) 10 MG tablet, Take 1 tablet (10 mg total) by mouth daily., Disp: 90 tablet, Rfl: 3   famotidine  (PEPCID ) 20 MG tablet, TAKE 1 TABLET BY MOUTH AT  BEDTIME, Disp: 90 tablet, Rfl: 3   losartan -hydrochlorothiazide (HYZAAR) 100-12.5 MG tablet, Take 0.5 tablets by mouth in the morning., Disp: , Rfl:    metoprolol  succinate (TOPROL -XL) 25 MG 24 hr tablet, Take 1 tablet (25 mg total) by  mouth daily., Disp: 90 tablet, Rfl: 3   nitroGLYCERIN  (NITROSTAT ) 0.4 MG SL tablet, Place 1 tablet (0.4 mg total) under the tongue every 5 (five) minutes as needed for chest pain., Disp: 25 tablet, Rfl: 1   ondansetron  (ZOFRAN ) 4 MG tablet, Take 1 tablet (4 mg total) by mouth every 8 (eight) hours as needed for nausea or vomiting., Disp: 90 tablet, Rfl: 0   oxyCODONE  (ROXICODONE ) 5 MG immediate release tablet, Take 1 tablet (5 mg total) by mouth every 4 (four) hours as needed for severe pain (pain score 7-10)., Disp: 30 tablet, Rfl: 0   simvastatin  (ZOCOR ) 40 MG tablet, Take 1 tablet (40 mg total) by mouth daily., Disp: 90 tablet, Rfl: 3   temazepam  (RESTORIL ) 15 MG capsule, Take 15 mg by mouth at bedtime as needed for sleep., Disp: , Rfl:    terazosin (HYTRIN) 5 MG capsule, Take 5 mg by mouth at bedtime., Disp: , Rfl:    ticagrelor  (BRILINTA ) 90 MG TABS tablet, Take 1 tablet (90 mg total) by mouth 2 (two) times daily., Disp: 180 tablet, Rfl: 3   albuterol  (PROVENTIL ) (2.5 MG/3ML) 0.083% nebulizer solution, Take 3 mLs (2.5 mg total) by nebulization 2 (two) times daily., Disp: 75 mL, Rfl: 3

## 2023-10-26 LAB — COLOGUARD: COLOGUARD: NEGATIVE

## 2023-11-02 ENCOUNTER — Encounter: Payer: Self-pay | Admitting: Pulmonary Disease

## 2023-11-04 ENCOUNTER — Encounter: Payer: Self-pay | Admitting: Pulmonary Disease

## 2023-11-14 ENCOUNTER — Other Ambulatory Visit: Payer: Self-pay

## 2023-11-14 MED ORDER — ALBUTEROL SULFATE (2.5 MG/3ML) 0.083% IN NEBU
2.5000 mg | INHALATION_SOLUTION | Freq: Two times a day (BID) | RESPIRATORY_TRACT | 3 refills | Status: AC
Start: 2023-11-14 — End: ?

## 2023-11-16 ENCOUNTER — Telehealth: Payer: Self-pay

## 2023-11-16 NOTE — Telephone Encounter (Signed)
 Copied from CRM [id number]. Topic: Medical Record Request - Records Request >> Nov 16, 2023 12:24 PM Corean Deutscher wrote: Reason for CRM: Jearlean Mince with [facility] called requesting clinical notes from the patient last office visit on May 27th. Jearlean Mince stated the medical notes needs to include diagnosis codes J41-J70.9 that includes either/or asthma, COPD, or chronic bronchitis. Jearlean Mince stated it can be faxed to [phone]. Jearlean Mince was thankful and verbalized understanding.  Dr Diania Fortes, can you make an addendum for this.

## 2023-11-22 NOTE — Telephone Encounter (Signed)
 Office note has been faxed.  Nothing further needed.

## 2023-11-23 ENCOUNTER — Telehealth: Payer: Self-pay

## 2023-11-23 NOTE — Telephone Encounter (Signed)
 OV notes faxed to [facility]

## 2023-12-14 DIAGNOSIS — Z1211 Encounter for screening for malignant neoplasm of colon: Secondary | ICD-10-CM | POA: Diagnosis not present

## 2023-12-14 DIAGNOSIS — R972 Elevated prostate specific antigen [PSA]: Secondary | ICD-10-CM | POA: Diagnosis not present

## 2023-12-31 ENCOUNTER — Other Ambulatory Visit: Payer: Self-pay | Admitting: Cardiology

## 2024-01-23 ENCOUNTER — Other Ambulatory Visit: Payer: Self-pay | Admitting: Cardiology

## 2024-02-07 DIAGNOSIS — H6983 Other specified disorders of Eustachian tube, bilateral: Secondary | ICD-10-CM | POA: Diagnosis not present

## 2024-02-07 DIAGNOSIS — H903 Sensorineural hearing loss, bilateral: Secondary | ICD-10-CM | POA: Diagnosis not present

## 2024-02-23 DIAGNOSIS — J479 Bronchiectasis, uncomplicated: Secondary | ICD-10-CM | POA: Diagnosis not present

## 2024-02-23 DIAGNOSIS — I10 Essential (primary) hypertension: Secondary | ICD-10-CM | POA: Diagnosis not present

## 2024-02-23 DIAGNOSIS — J189 Pneumonia, unspecified organism: Secondary | ICD-10-CM | POA: Diagnosis not present

## 2024-03-19 ENCOUNTER — Telehealth: Payer: Self-pay

## 2024-03-19 NOTE — Telephone Encounter (Signed)
 Copied from CRM #[id number]. Topic: Clinical - Medical Advice >> Mar 19, 2024  9:29 AM Celestine FALCON wrote: Reason for CRM: Pt stated he's having sx of COVID and his family may have passed it to him recently. Patient stated he's having chills, feels under the weather, a little bit of a cough, and he's been taking over the counter meds.   Pt stated he would like some medication called into this preferred pharmacy [id number] - [place], [place] - [address] if possible.  Pt's phone number is [phone] ok to leave a vm.  Routing to Lauraine to advise as Dr Kara will not be in until Wednesday.

## 2024-03-20 ENCOUNTER — Telehealth: Payer: Self-pay

## 2024-03-20 ENCOUNTER — Other Ambulatory Visit: Payer: Self-pay | Admitting: Pulmonary Disease

## 2024-03-20 NOTE — Telephone Encounter (Signed)
 Patient did call another provider to have situation resolved  I spoke with him and apologized for not being able to facilitate an intervention sooner

## 2024-03-20 NOTE — Telephone Encounter (Signed)
 Called and spoke to patient. Unable to speak with patient's daughter, Richardson, as there is not a DPR on file.   He stated that he tested positive for covid yesterday after a weekend trip with his daughter and grandchild. Pt stated that he called in for recommendations on 03/19/2024 and was advised that he would receive a call back from our office around 3:00p. He stated that he waited until 3:00p yesterday and called back and was advised by our office that he would receive a call back before the office closes at 5:00. Pt stated that he did not receive a call back from our office yesterday.   Please refer to 03/19/2024 phone note and SG's recommendations. It appears that Sarah's response was sent back directly to CMA who sent the message and not the triage pool. This resulted in patient not being contacted on 03/19/2024.  Patient stated that he called PCP today and was prescribed medication.  Advised patient that re education would be provided to staff.   Pt stated that he would make his daughter, Richardson aware of our conversation.

## 2024-03-20 NOTE — Telephone Encounter (Signed)
 This was handled by margie. NFN

## 2024-03-20 NOTE — Telephone Encounter (Signed)
 Please send to DOD or patient's primary pulmonary physician.

## 2024-03-20 NOTE — Telephone Encounter (Signed)
 Copied from CRM #[id number]. Topic: General - Other >> Mar 20, 2024 12:08 PM Devaughn RAMAN wrote: Reason for CRM: Patient's daughter Richardson called to discuss pt has Covid-19 and pt's oxygen in 92. Myles Richardson of message per Lauraine Lites regarding instructions for pt protocol to f/u regarding covid. Richardson stated Pt is Covid-19 positive, Richardson would like pt to be contacted regarding Covid-19 positive test.

## 2024-03-20 NOTE — Telephone Encounter (Signed)
 Copied from CRM [id number]. Topic: General - Other >> Mar 20, 2024  2:05 PM Raymond Stanley wrote: Reason for CRM: Patient and patients daughter Raymond Stanley states she very upset with [facility] and would like a call back from a supervisor. Richardson declined to provide any further information.   Richardson is requesting a call back at  [phone]

## 2024-04-19 ENCOUNTER — Ambulatory Visit (INDEPENDENT_AMBULATORY_CARE_PROVIDER_SITE_OTHER)

## 2024-04-19 ENCOUNTER — Encounter: Payer: Self-pay | Admitting: Pulmonary Disease

## 2024-04-19 ENCOUNTER — Ambulatory Visit (INDEPENDENT_AMBULATORY_CARE_PROVIDER_SITE_OTHER): Admitting: Pulmonary Disease

## 2024-04-19 ENCOUNTER — Ambulatory Visit: Payer: Self-pay | Admitting: Pulmonary Disease

## 2024-04-19 VITALS — BP 148/80 | HR 59 | Temp 97.8°F | Ht 69.0 in | Wt 175.0 lb

## 2024-04-19 DIAGNOSIS — K219 Gastro-esophageal reflux disease without esophagitis: Secondary | ICD-10-CM | POA: Diagnosis not present

## 2024-04-19 DIAGNOSIS — R053 Chronic cough: Secondary | ICD-10-CM

## 2024-04-19 DIAGNOSIS — R059 Cough, unspecified: Secondary | ICD-10-CM | POA: Diagnosis not present

## 2024-04-19 DIAGNOSIS — G4700 Insomnia, unspecified: Secondary | ICD-10-CM

## 2024-04-19 DIAGNOSIS — R5383 Other fatigue: Secondary | ICD-10-CM | POA: Diagnosis not present

## 2024-04-19 DIAGNOSIS — J4530 Mild persistent asthma, uncomplicated: Secondary | ICD-10-CM

## 2024-04-19 DIAGNOSIS — I700 Atherosclerosis of aorta: Secondary | ICD-10-CM | POA: Diagnosis not present

## 2024-04-19 LAB — CBC WITH DIFFERENTIAL/PLATELET
Basophils Absolute: 0 K/uL (ref 0.0–0.1)
Basophils Relative: 0.3 % (ref 0.0–3.0)
Eosinophils Absolute: 0.1 K/uL (ref 0.0–0.7)
Eosinophils Relative: 1.4 % (ref 0.0–5.0)
HCT: 43.8 % (ref 39.0–52.0)
Hemoglobin: 14.8 g/dL (ref 13.0–17.0)
Lymphocytes Relative: 24.7 % (ref 12.0–46.0)
Lymphs Abs: 1.5 K/uL (ref 0.7–4.0)
MCHC: 33.7 g/dL (ref 30.0–36.0)
MCV: 91.8 fl (ref 78.0–100.0)
Monocytes Absolute: 0.6 K/uL (ref 0.1–1.0)
Monocytes Relative: 10.5 % (ref 3.0–12.0)
Neutro Abs: 3.8 K/uL (ref 1.4–7.7)
Neutrophils Relative %: 63.1 % (ref 43.0–77.0)
Platelets: 177 K/uL (ref 150.0–400.0)
RBC: 4.77 Mil/uL (ref 4.22–5.81)
RDW: 14.4 % (ref 11.5–15.5)
WBC: 6 K/uL (ref 4.0–10.5)

## 2024-04-19 LAB — COMPREHENSIVE METABOLIC PANEL WITH GFR
ALT: 17 U/L (ref 0–53)
AST: 19 U/L (ref 0–37)
Albumin: 4.2 g/dL (ref 3.5–5.2)
Alkaline Phosphatase: 71 U/L (ref 39–117)
BUN: 14 mg/dL (ref 6–23)
CO2: 28 meq/L (ref 19–32)
Calcium: 9.9 mg/dL (ref 8.4–10.5)
Chloride: 104 meq/L (ref 96–112)
Creatinine, Ser: 0.83 mg/dL (ref 0.40–1.50)
GFR: 84.14 mL/min (ref >60.00)
Glucose, Bld: 102 mg/dL — ABNORMAL HIGH (ref 70–99)
Potassium: 3.8 meq/L (ref 3.5–5.1)
Sodium: 139 meq/L (ref 135–145)
Total Bilirubin: 0.7 mg/dL (ref 0.2–1.2)
Total Protein: 7 g/dL (ref 6.0–8.3)

## 2024-04-19 LAB — FOLATE: Folate: 12.9 ng/mL (ref >5.9)

## 2024-04-19 LAB — VITAMIN B12: Vitamin B-12: 1057 pg/mL — ABNORMAL HIGH (ref 211–911)

## 2024-04-19 MED ORDER — ESZOPICLONE 1 MG PO TABS: 1.0000 mg | ORAL_TABLET | Freq: Every evening | ORAL | 0 refills | Status: DC | PRN

## 2024-04-19 MED ORDER — BUDESONIDE 0.25 MG/2ML IN SUSP: 0.2500 mg | Freq: Every day | RESPIRATORY_TRACT | 12 refills | Status: AC

## 2024-04-19 NOTE — Patient Instructions (Addendum)
 Resume budesonide  nebulizer treatments twice daily  Continue albuterol  nebulizer as needed, every 4-6 hours  Resume famotidine  1 tab at bedtime for reflux  Start lunesta  1mg  tablet at bedtime for insomnia. Please message me if you feel that it is not helping and we can direct you to take 2mg  at bedtime and monitor for effect.  Schedule a follow up appointment with Dr. Theodoro or Dr. Olena for Insomnia  We will check labs today regarding your fatigue  Follow up in 3 months, call sooner if needed

## 2024-04-19 NOTE — Assessment & Plan Note (Addendum)
 SABRA

## 2024-04-19 NOTE — Progress Notes (Signed)
 Established Patient Pulmonology Office Visit   Subjective:  Patient ID: Raymond Stanley, male    DOB: 1945/08/10  MRN: [id number]  CC:  Chief Complaint  Patient presents with   Asthma    6 month follow up asthma    Discussed the use of AI scribe software for clinical note transcription with the patient, who gave verbal consent to proceed.  History of Present Illness Raymond Stanley is a 78 year old male, never smoker with history of hypertension, CAD, GERD and pulmonary emboli who returns to pulmonary clinic for shortness of breath.    Initial OV 05/12/2023 He was seen by Dr. Ladona 03/04/23, note reviewed, where he was noted to complain of dyspnea for several years. He reported exposure to asbestos in the past. He was evaluated by Dr. Geronimo of pulmonary in 2019. HRCT Chest did not indicate findings of ILD but did show air trapping. He had mild diffusion defect on PFTs in 2019. PFTs from 2023 at [facility] clinic are within normal limits. No ERV available for review. Most recent Echo 03/2023 shows LV EF 45-50% (has been 40% in past), Grade I diastolic dysfunction. Normal RV function and mildly enlarged RV. Dilated left and right atria.    The patient, with a history of hiatal hernia and recent heart attack, presents with a chief complaint of progressive shortness of breath over the past four to five years. Initially, the shortness of breath was only noticeable during physical exertion, such as working outside. However, it has progressively worsened to the point where even walking from the bedroom to the kitchen or up a flight of stairs causes significant breathlessness. The patient also reports increased frequency of acid reflux, despite taking omeprazole 40mg  daily, sometimes twice a day if anticipating a late or spicy meal. The patient sleeps with three pillows to alleviate reflux symptoms but still experiences episodes approximately once a week. The patient has seen multiple pulmonologists over  the past five years, with suggested causes for the symptoms ranging from acid reflux damage to the lungs to asbestosis. The patient believes the hiatal hernia is the primary cause of the shortness of breath.   OV 07/27/23 He experienced a respiratory illness towards the end of last month, initially diagnosed as bronchitis and a possible sinus infection at an urgent care visit. He was prescribed doxycycline  for ten days, which did not alleviate his symptoms. Subsequently, he self-administered amoxicillin for a week without improvement. Upon visiting his primary care physician, he requested a chest x-ray, which revealed pneumonia based on lateral view. He was treated with a Z-Pak and another antibiotic, leading to improvement within two days.   He has a persistent dry cough, described as nonproductive and lingering. He notes a long-standing dry cough even before the pneumonia diagnosis. No episodes of choking or aspiration, and no fever or chills during the illness. His appetite is fair, but energy levels have been low for a long time. He experiences discomfort in his back along the shoulder blade and on the left side when coughing, but no pain on the right side.    He is preparing for surgery to address a hiatal hernia, which is expected to improve his breathing. He has been using albuterol  through a nebulizer three times a day during the pneumonia episode, which provided some relief, especially at night. He has not used it in the last four to five days as he feels the pneumonia is under control. He is currently taking omeprazole 40 mg in  the morning and Pepcid  at night, which has effectively managed his heartburn and reflux symptoms.   He mentions taking his grandson to a monster truck event, which he speculates might have contributed to his recent illness. He has a history of attending such events with his son, who has passed away, and now continues the tradition with his grandson.   OV 10/25/23 He  experiences improved but some persistent shortness of breath since surgery. Severe dyspnea with minimal exertion has resolved. He uses nebulizer treatments with budesonide  and albuterol , with a two-week supply remaining, but lacks an albuterol  inhaler. No wheezing is present, and his nonproductive cough has resolved. During hospitalization, a chest tube was placed. A swallow study indicated material not descending as expected, leading to a sensation of food or drink getting stuck. He manages this by taking small sips and remaining upright after meals.   OV 04/19/24 He has had pneumonia three times since his last visit, with episodes in February, June, and September. Treatment was effective in February, but no chest x-rays were performed in June and September, leading to questions about the accuracy of those diagnoses. His breathing has worsened, and a dry cough has returned.  He has been using albuterol  and budesonide  twice daily since September but stopped two weeks ago due to uncertainty about his necessity. There was no significant improvement with these medications. He continues to experience shortness of breath but has not reduced his activities, although he takes breaks due to fatigue. No sinus congestion, drainage, heartburn, reflux, mucus production, wheezing, or leg swelling.  He worked for [employer] and mentioned potential asbestos exposure.        ROS    Current Outpatient Medications:    acetaminophen  (TYLENOL ) 325 MG tablet, Take 2 tablets (650 mg total) by mouth every 4 (four) hours as needed., Disp: , Rfl:    albuterol  (PROVENTIL ) (2.5 MG/3ML) 0.083% nebulizer solution, Take 3 mLs (2.5 mg total) by nebulization 2 (two) times daily., Disp: 75 mL, Rfl: 3   amLODipine  (NORVASC ) 10 MG tablet, Take 1 tablet (10 mg total) by mouth daily. (Patient taking differently: Take 5 mg by mouth daily.), Disp: 90 tablet, Rfl: 3   aspirin  81 MG tablet, Take 81 mg by mouth daily., Disp: , Rfl:     budesonide  (PULMICORT ) 0.25 MG/2ML nebulizer solution, Take 2 mLs (0.25 mg total) by nebulization daily., Disp: 60 mL, Rfl: 12   cholecalciferol (VITAMIN D3) 25 MCG (1000 UNIT) tablet, Take 1,000 Units by mouth daily., Disp: , Rfl:    Cyanocobalamin  (VITAMIN B12) 1000 MCG TBCR, Take 1 capsule by mouth daily., Disp: , Rfl:    diclofenac  sodium (VOLTAREN ) 1 % GEL, Apply 2-4 g topically 4 (four) times daily. (Patient taking differently: Apply 2-4 g topically daily as needed (artheritis).), Disp: 15 Tube, Rfl: 4   eszopiclone (LUNESTA) 1 MG TABS tablet, Take 1 tablet (1 mg total) by mouth at bedtime as needed for sleep. Take immediately before bedtime, Disp: 30 tablet, Rfl: 0   ezetimibe  (ZETIA ) 10 MG tablet, TAKE 1 TABLET BY MOUTH DAILY, Disp: 90 tablet, Rfl: 2   famotidine  (PEPCID ) 20 MG tablet, TAKE 1 TABLET BY MOUTH AT  BEDTIME, Disp: 90 tablet, Rfl: 3   losartan -hydrochlorothiazide (HYZAAR) 100-12.5 MG tablet, TAKE 1 TABLET BY MOUTH IN THE  MORNING, Disp: 90 tablet, Rfl: 2   metoprolol  succinate (TOPROL -XL) 25 MG 24 hr tablet, TAKE 1 TABLET BY MOUTH DAILY, Disp: 90 tablet, Rfl: 2   nitroGLYCERIN  (NITROSTAT ) 0.4 MG  SL tablet, Place 1 tablet (0.4 mg total) under the tongue every 5 (five) minutes as needed for chest pain., Disp: 25 tablet, Rfl: 1   ondansetron  (ZOFRAN ) 4 MG tablet, Take 1 tablet (4 mg total) by mouth every 8 (eight) hours as needed for nausea or vomiting., Disp: 90 tablet, Rfl: 0   oxyCODONE  (ROXICODONE ) 5 MG immediate release tablet, Take 1 tablet (5 mg total) by mouth every 4 (four) hours as needed for severe pain (pain score 7-10)., Disp: 30 tablet, Rfl: 0   simvastatin  (ZOCOR ) 40 MG tablet, TAKE 1 TABLET BY MOUTH DAILY, Disp: 90 tablet, Rfl: 3   temazepam  (RESTORIL ) 15 MG capsule, Take 15 mg by mouth at bedtime as needed for sleep., Disp: , Rfl:    terazosin (HYTRIN) 5 MG capsule, Take 5 mg by mouth at bedtime., Disp: , Rfl:    ticagrelor  (BRILINTA ) 90 MG TABS tablet, TAKE 1 TABLET  BY MOUTH TWICE  DAILY, Disp: 180 tablet, Rfl: 2      Objective:  BP (!) 148/80   Pulse (!) 59   Temp 97.8 F (36.6 C) (Oral)   Ht 5' 9 (1.753 m)   Wt 175 lb (79.4 kg)   SpO2 99%   BMI 25.84 kg/m     Physical Exam Constitutional:      General: He is not in acute distress.    Appearance: Normal appearance.  Eyes:     General: No scleral icterus.    Conjunctiva/sclera: Conjunctivae normal.  Cardiovascular:     Rate and Rhythm: Normal rate and regular rhythm.  Pulmonary:     Breath sounds: No wheezing, rhonchi or rales.  Musculoskeletal:     Right lower leg: No edema.     Left lower leg: No edema.  Skin:    General: Skin is warm and dry.  Neurological:     General: No focal deficit present.      Diagnostic Review:       Assessment & Plan:   Assessment & Plan Mild persistent asthma without complication     Gastroesophageal reflux disease without esophagitis     Chronic cough  Orders:   DG Chest 2 View; Future  Insomnia, unspecified type  Orders:   eszopiclone (LUNESTA) 1 MG TABS tablet; Take 1 tablet (1 mg total) by mouth at bedtime as needed for sleep. Take immediately before bedtime  Other fatigue  Orders:   CBC with Differential/Platelet; Future   Comp Met (CMET); Future   B12; Future   Folate; Future   Pulmonary Function Test; Future   Assessment and Plan Assessment & Plan Chronic cough and shortness of breath in the setting of mild persistent asthma Recurrent pneumonia and COVID-19 with worsening dry cough and dyspnea. - Ordered chest x-ray. - Ordered repeat breathing tests. - Restart famotidine  at bedtime. - Use albuterol  as needed. - If no improvement with famotidine , restart budesonide  nebulizer for at least one month.  Insomnia Chronic insomnia with difficulty initiating and maintaining sleep. Restoril  effective but limited by regulations. Open to non-addictive alternatives. - Prescribed Lunesta 1 mg, increase to 2 mg if  needed. - Referred to sleep specialist. - Instructed to avoid concurrent use of Restoril  and Lunesta.  Fatigue Persistent fatigue possibly related to insomnia and respiratory issues. - Ordered lab work to assess underlying causes.  Gastroesophageal reflux disease Previous improvement with famotidine . - Restart famotidine  at bedtime.      Return in about 3 months (around 07/20/2024) for f/u visit Dr. Kara.  Dorn KATHEE Chill, MD

## 2024-04-25 ENCOUNTER — Ambulatory Visit (INDEPENDENT_AMBULATORY_CARE_PROVIDER_SITE_OTHER)

## 2024-04-25 DIAGNOSIS — R5383 Other fatigue: Secondary | ICD-10-CM | POA: Diagnosis not present

## 2024-04-25 LAB — PULMONARY FUNCTION TEST
DL/VA % pred: 87 %
DL/VA: 3.44 ml/min/mmHg/L
DLCO cor % pred: 79 %
DLCO cor: 19.07 ml/min/mmHg
DLCO unc % pred: 79 %
DLCO unc: 19.18 ml/min/mmHg
FEF 25-75 Pre: 2.49 L/s
FEF2575-%Pred-Pre: 122 %
FEV1-%Pred-Pre: 91 %
FEV1-Pre: 2.61 L
FEV1FVC-%Pred-Pre: 109 %
FEV6-%Pred-Pre: 87 %
FEV6-Pre: 3.27 L
FEV6FVC-%Pred-Pre: 106 %
FVC-%Pred-Pre: 82 %
FVC-Pre: 3.3 L
Pre FEV1/FVC ratio: 79 %
Pre FEV6/FVC Ratio: 99 %
RV % pred: 124 %
RV: 3.16 L
TLC % pred: 95 %
TLC: 6.51 L

## 2024-04-25 NOTE — Patient Instructions (Signed)
 Full pft w/o post spiro

## 2024-04-25 NOTE — Progress Notes (Signed)
 Full pft w/o post spiro

## 2024-05-01 ENCOUNTER — Ambulatory Visit

## 2024-05-01 VITALS — BP 132/84 | HR 91 | Ht 69.0 in | Wt 176.0 lb

## 2024-05-01 DIAGNOSIS — R5383 Other fatigue: Secondary | ICD-10-CM | POA: Diagnosis not present

## 2024-05-01 DIAGNOSIS — F5104 Psychophysiologic insomnia: Secondary | ICD-10-CM | POA: Diagnosis not present

## 2024-05-01 DIAGNOSIS — G4700 Insomnia, unspecified: Secondary | ICD-10-CM

## 2024-05-01 MED ORDER — ESZOPICLONE 2 MG PO TABS: 2.0000 mg | ORAL_TABLET | Freq: Every evening | ORAL | 0 refills | Status: DC | PRN

## 2024-05-01 NOTE — Progress Notes (Signed)
 Pulmonology Office Visit   Subjective:  Patient ID: Raymond Stanley, male    DOB: 17-Jul-1945  MRN: [id number]  Referred by: Charlott Dorn LABOR, *  CC:  Chief Complaint  Patient presents with   Insomnia    Having  this going for about 15 years , he wants to discuss possible treatment    HPI Raymond Stanley is a 79 y.o. male with hypertension, STEMI, GERD, hyperlipidemia presents for evaluation of insomnia.  Follows Dr Kara. Last seen in 04/19/24> mild persistent asthma and cough. Insomnia>lunesta  2mg . Was on restoril  before.   Respective notes from provider reviewed as appropriate to gather relevant information for patient care.   Discussed the use of AI scribe software for clinical note transcription with the patient, who gave verbal consent to proceed.  History of Present Illness   Raymond Stanley is a 78 year old male who presents with insomnia. He was referred by Dr. Duvall for evaluation of insomnia.  He experiences significant insomnia, characterized by difficulty falling asleep and frequent awakenings throughout the night. He typically goes to bed between 10:30 and 11:00 PM but can take up to two hours to fall asleep. He often wakes up every two hours and struggles to return to sleep, resulting in waking up as early as 3:00 or 4:00 AM. Without medication, he wakes up five to six times a night and feels constantly tired during the day.  He has been using temazepam  (Restoril ) for approximately 15 years, which allows him to sleep for about five hours without dreaming or restlessness. Recently, he was prescribed Lunesta  and has been experimenting with the dosage. Taking two pills of Lunesta  provides him with about five hours of sleep, while one pill results in only two hours of sleep. He has tried various over-the-counter sleep aids without success.  No snoring, dry mouth, or morning headaches, but he feels constantly tired during the day. He does not take naps and remains active,  assisting friends and managing household responsibilities, including caring for his wife who has moderate dementia. He acknowledges significant stress related to his wife's condition and his responsibilities, which may contribute to his sleep difficulties.  He has not been previously tested for sleep apnea and has not had a thyroid  function test.      PRIOR TESTS and IMAGING: None.       No data to display          Allergies: Ace inhibitors, Crestor [rosuvastatin], Carac [fluorouracil], and Lipitor [atorvastatin ]  Current Outpatient Medications:    acetaminophen  (TYLENOL ) 325 MG tablet, Take 2 tablets (650 mg total) by mouth every 4 (four) hours as needed., Disp: , Rfl:    albuterol  (PROVENTIL ) (2.5 MG/3ML) 0.083% nebulizer solution, Take 3 mLs (2.5 mg total) by nebulization 2 (two) times daily., Disp: 75 mL, Rfl: 3   amLODipine  (NORVASC ) 10 MG tablet, Take 1 tablet (10 mg total) by mouth daily. (Patient taking differently: Take 5 mg by mouth daily.), Disp: 90 tablet, Rfl: 3   aspirin  81 MG tablet, Take 81 mg by mouth daily., Disp: , Rfl:    budesonide  (PULMICORT ) 0.25 MG/2ML nebulizer solution, Take 2 mLs (0.25 mg total) by nebulization daily., Disp: 60 mL, Rfl: 12   cholecalciferol (VITAMIN D3) 25 MCG (1000 UNIT) tablet, Take 1,000 Units by mouth daily., Disp: , Rfl:    Cyanocobalamin  (VITAMIN B12) 1000 MCG TBCR, Take 1 capsule by mouth daily., Disp: , Rfl:    diclofenac  sodium (VOLTAREN ) 1 % GEL,  Apply 2-4 g topically 4 (four) times daily. (Patient taking differently: Apply 2-4 g topically daily as needed (artheritis).), Disp: 15 Tube, Rfl: 4   eszopiclone  (LUNESTA ) 1 MG TABS tablet, Take 1 tablet (1 mg total) by mouth at bedtime as needed for sleep. Take immediately before bedtime, Disp: 30 tablet, Rfl: 0   ezetimibe  (ZETIA ) 10 MG tablet, TAKE 1 TABLET BY MOUTH DAILY, Disp: 90 tablet, Rfl: 2   famotidine  (PEPCID ) 20 MG tablet, TAKE 1 TABLET BY MOUTH AT  BEDTIME, Disp: 90 tablet, Rfl:  3   losartan -hydrochlorothiazide (HYZAAR) 100-12.5 MG tablet, TAKE 1 TABLET BY MOUTH IN THE  MORNING, Disp: 90 tablet, Rfl: 2   metoprolol  succinate (TOPROL -XL) 25 MG 24 hr tablet, TAKE 1 TABLET BY MOUTH DAILY, Disp: 90 tablet, Rfl: 2   nitroGLYCERIN  (NITROSTAT ) 0.4 MG SL tablet, Place 1 tablet (0.4 mg total) under the tongue every 5 (five) minutes as needed for chest pain., Disp: 25 tablet, Rfl: 1   ondansetron  (ZOFRAN ) 4 MG tablet, Take 1 tablet (4 mg total) by mouth every 8 (eight) hours as needed for nausea or vomiting., Disp: 90 tablet, Rfl: 0   oxyCODONE  (ROXICODONE ) 5 MG immediate release tablet, Take 1 tablet (5 mg total) by mouth every 4 (four) hours as needed for severe pain (pain score 7-10)., Disp: 30 tablet, Rfl: 0   simvastatin  (ZOCOR ) 40 MG tablet, TAKE 1 TABLET BY MOUTH DAILY, Disp: 90 tablet, Rfl: 3   temazepam  (RESTORIL ) 15 MG capsule, Take 15 mg by mouth at bedtime as needed for sleep., Disp: , Rfl:    terazosin (HYTRIN) 5 MG capsule, Take 5 mg by mouth at bedtime., Disp: , Rfl:    ticagrelor  (BRILINTA ) 90 MG TABS tablet, TAKE 1 TABLET BY MOUTH TWICE  DAILY, Disp: 180 tablet, Rfl: 2 Past Medical History:  Diagnosis Date   Actinic keratoses    BPH (benign prostatic hypertrophy)    Dysrhythmia    ED (erectile dysfunction)    Esophageal reflux    Hearing loss    Myocardial infarction (HCC)    Other and unspecified hyperlipidemia    Premature atrial contractions    Pulmonary emboli (HCC)    Skin cancer    Unspecified essential hypertension    Past Surgical History:  Procedure Laterality Date   CORONARY/GRAFT ACUTE MI REVASCULARIZATION N/A 02/23/2023   Procedure: Coronary/Graft Acute MI Revascularization;  Surgeon: Burnard Debby LABOR, MD;  Location: [facility];  Service: Cardiovascular;  Laterality: N/A;   ESOPHAGOGASTRODUODENOSCOPY N/A 09/05/2023   Procedure: EGD (ESOPHAGOGASTRODUODENOSCOPY);  Surgeon: Shyrl Linnie KIDD, MD;  Location: [facility] OR;  Service: Thoracic;   Laterality: N/A;   EXCISION MASS NECK Right 05/08/2021   Procedure: EXCISION RIGHT SUPERFICIAL NECK MASS UNDER LOCAL ANESTHESIA;  Surgeon: Herminio Miu, MD;  Location: [facility];  Service: ENT;  Laterality: Right;   INGUINAL HERNIA REPAIR Right    XI ROBOTIC ASSISTED PARAESOPHAGEAL HERNIA REPAIR N/A 09/05/2023   Procedure: REPAIR, HERNIA, PARAESOPHAGEAL, ROBOT-ASSISTED USING OVITEX REINFORCED TISSUE MATRIX;  Surgeon: Shyrl Linnie KIDD, MD;  Location: [facility] OR;  Service: Thoracic;  Laterality: N/A;   Family History  Problem Relation Age of Onset   Hypertension Mother    CAD Mother    Hypertension Father    CAD Father    Heart disease Brother    Hypertension Brother    Alzheimer's disease Brother    Hypertension Maternal Grandmother    CAD Maternal Grandmother    Asthma Daughter    Social History  Socioeconomic History   Marital status: Married    Spouse name: Not on file   Number of children: Not on file   Years of education: Not on file   Highest education level: Not on file  Occupational History   Not on file  Tobacco Use   Smoking status: Never    Passive exposure: Never   Smokeless tobacco: Never  Vaping Use   Vaping status: Never Used  Substance and Sexual Activity   Alcohol use: Yes    Comment: Rarely   Drug use: No   Sexual activity: Not on file  Other Topics Concern   Not on file  Social History Narrative   Wife has dementia   Social Drivers of Health   Financial Resource Strain: Not on file  Food Insecurity: No Food Insecurity (09/05/2023)   Hunger Vital Sign    Worried About Running Out of Food in the Last Year: Never true    Ran Out of Food in the Last Year: Never true  Transportation Needs: No Transportation Needs (09/05/2023)   PRAPARE - Administrator, Civil Service (Medical): No    Lack of Transportation (Non-Medical): No  Physical Activity: Not on file  Stress: Not on file  Social Connections: Moderately Integrated  (09/05/2023)   Social Connection and Isolation Panel    Frequency of Communication with Friends and Family: More than three times a week    Frequency of Social Gatherings with Friends and Family: Three times a week    Attends Religious Services: More than 4 times per year    Active Member of Clubs or Organizations: No    Attends Banker Meetings: Never    Marital Status: Married  Catering Manager Violence: Not At Risk (09/05/2023)   Humiliation, Afraid, Rape, and Kick questionnaire    Fear of Current or Ex-Partner: No    Emotionally Abused: No    Physically Abused: No    Sexually Abused: No       Objective:  BP 132/84   Pulse 91   Ht 5' 9 (1.753 m)   Wt 176 lb (79.8 kg)   SpO2 94%   BMI 25.99 kg/m  BMI Readings from Last 3 Encounters:  05/01/24 25.99 kg/m  04/19/24 25.84 kg/m  10/25/23 25.70 kg/m    Physical Exam: Physical Exam   ENT: Normal mucosa. No hypertrophy of inferior turbinates. Tonsils are normal sized. Modified Mallampati score is normal. PULMONARY: Lungs clear to auscultation bilaterally, no adventitious breath sounds. CARDIOVASCULAR: Regular rate and rhythm, S1 S2 normal, no murmurs. ABDOMEN: Abdomen soft, nontender. Bowel sounds are normal. EXTREMITIES: No peripheral edema noted.       Diagnostic Review:  Last metabolic panel Lab Results  Component Value Date   GLUCOSE 102 (H) 04/19/2024   NA 139 04/19/2024   K 3.8 04/19/2024   CL 104 04/19/2024   CO2 28 04/19/2024   BUN 14 04/19/2024   CREATININE 0.83 04/19/2024   GFR 84.14 04/19/2024   CALCIUM  9.9 04/19/2024   PROT 7.0 04/19/2024   ALBUMIN  4.2 04/19/2024   BILITOT 0.7 04/19/2024   ALKPHOS 71 04/19/2024   AST 19 04/19/2024   ALT 17 04/19/2024   ANIONGAP 8 09/07/2023         Assessment & Plan:   Assessment & Plan Other fatigue  Orders:   TSH; Future   Home sleep test; Future  Insomnia, unspecified type        Assessment and Plan    Chronic  insomnia Difficulty initiating and maintaining sleep with frequent awakenings and daytime fatigue. Stress management is crucial for improving sleep quality. There has been no change in stress in last few months. Wife has dementia. Restoril  worked better and was taking only 2 times/week.  - Continue Lunesta  2 mg as needed for sleep. - Maintain consistent sleep schedule with wake-up time at 6 AM. He believes he can only sleep for 5 hours a night. Maintain sleep zone from MN-6 am. Counselled that body need for sleep is reduced with age.  - Avoid additional Lunesta  if waking early; get out of bed and start the day. - Implement stress management techniques.  Fatigue, evaluation for secondary causes Persistent fatigue despite adequate sleep duration. Differential includes sleep apnea and thyroid  dysfunction. Normal hemoglobin levels. - Ordered thyroid  function tests. - ordered HST.    Suspected obstructive sleep apnea Frequent awakenings and daytime fatigue suggestive of sleep apnea. No snoring or observed apneas reported. - Ordered home sleep study.         Notes from 04/19/24 Dr Kara reviewed as to gather relevant information for patient care and formulating plan.   Return in about 3 months (around 07/30/2024).   I personally spent a total of 30 minutes in the care of the patient today including preparing to see the patient, getting/reviewing separately obtained history, performing a medically appropriate exam/evaluation, counseling and educating, placing orders, documenting clinical information in the EHR, independently interpreting results, and communicating results.   Avani Sensabaugh, MD

## 2024-05-01 NOTE — Addendum Note (Signed)
 Addended by: Greogry Goodwyn D on: 05/01/2024 04:08 PM   Modules accepted: Orders

## 2024-05-01 NOTE — Patient Instructions (Signed)
  VISIT SUMMARY: Today, we discussed your ongoing issues with insomnia and daytime fatigue. We reviewed your current medications and sleep habits, and we have made some adjustments to help improve your sleep quality. Additionally, we are investigating potential underlying causes of your fatigue, including sleep apnea and thyroid  dysfunction.  YOUR PLAN: -CHRONIC INSOMNIA: Chronic insomnia means having trouble falling asleep or staying asleep for a long period. To help manage this, continue taking Lunesta  2 mg as needed for sleep. Try to keep a consistent sleep schedule by waking up at 6 AM every day. If you wake up early, avoid taking more Lunesta ; instead, get out of bed and start your day. Adjust your sleep window to 11:30 PM to 6 AM if you can maintain sleep during this time. Also, practice stress management techniques to help improve your sleep quality.  -FATIGUE, EVALUATION FOR SECONDARY CAUSES: Fatigue means feeling very tired even after getting enough sleep. We are checking for other possible causes, such as thyroid  problems and sleep apnea. We have ordered thyroid  function tests and conducted a sleep study to investigate further.  -SUSPECTED OBSTRUCTIVE SLEEP APNEA: Obstructive sleep apnea is a condition where your breathing stops and starts during sleep, leading to frequent awakenings and daytime fatigue. Although you do not snore or have observed apneas, we have ordered a home sleep study to check for this condition.  INSTRUCTIONS: Please follow up with the thyroid  function tests and complete the home sleep study as ordered. Continue with the current sleep management plan and stress management techniques. If you have any questions or concerns, do not hesitate to contact our office.                      Contains text generated by Abridge.                                 Contains text generated by Abridge.

## 2024-05-02 LAB — TSH: TSH: 1.29 u[IU]/mL (ref 0.35–5.50)

## 2024-05-04 ENCOUNTER — Ambulatory Visit: Admitting: Pulmonary Disease

## 2024-05-29 ENCOUNTER — Telehealth: Payer: Self-pay | Admitting: Cardiology

## 2024-05-29 NOTE — Telephone Encounter (Signed)
 Pt c/o medication issue:  1. Name of Medication:   ticagrelor  (BRILINTA ) 90 MG TABS tablet    2. How are you currently taking this medication (dosage and times per day)? TAKE 1 TABLET BY MOUTH TWICE DAILY   3. Are you having a reaction (difficulty breathing--STAT)? No  4. What is your medication issue? Patient is calling because his insurance is no longer going to cover this medication only the generic. Patient gave a pre auth code of [id number]. Patient is scheduled for a 18yr f/u tomorrow. He would like to speak about this and potentially switching from Zocor  to Rosuvastatin.

## 2024-05-30 ENCOUNTER — Telehealth: Payer: Self-pay

## 2024-05-30 ENCOUNTER — Other Ambulatory Visit (HOSPITAL_COMMUNITY): Payer: Self-pay

## 2024-05-30 ENCOUNTER — Encounter: Payer: Self-pay | Admitting: Cardiology

## 2024-05-30 ENCOUNTER — Telehealth: Payer: Self-pay | Admitting: Pharmacy Technician

## 2024-05-30 ENCOUNTER — Encounter

## 2024-05-30 ENCOUNTER — Ambulatory Visit: Admitting: Cardiology

## 2024-05-30 VITALS — BP 172/84 | HR 63 | Resp 16 | Ht 69.0 in | Wt 178.0 lb

## 2024-05-30 DIAGNOSIS — R5383 Other fatigue: Secondary | ICD-10-CM

## 2024-05-30 DIAGNOSIS — I2510 Atherosclerotic heart disease of native coronary artery without angina pectoris: Secondary | ICD-10-CM | POA: Diagnosis not present

## 2024-05-30 DIAGNOSIS — I10 Essential (primary) hypertension: Secondary | ICD-10-CM | POA: Insufficient documentation

## 2024-05-30 DIAGNOSIS — E7800 Pure hypercholesterolemia, unspecified: Secondary | ICD-10-CM | POA: Diagnosis not present

## 2024-05-30 DIAGNOSIS — G4730 Sleep apnea, unspecified: Secondary | ICD-10-CM | POA: Diagnosis not present

## 2024-05-30 LAB — LIPID PANEL
Chol/HDL Ratio: 4.2 ratio (ref 0.0–5.0)
Cholesterol, Total: 219 mg/dL — ABNORMAL HIGH (ref 100–199)
HDL: 52 mg/dL
LDL Chol Calc (NIH): 148 mg/dL — ABNORMAL HIGH (ref 0–99)
Triglycerides: 107 mg/dL (ref 0–149)
VLDL Cholesterol Cal: 19 mg/dL (ref 5–40)

## 2024-05-30 MED ORDER — CLOPIDOGREL BISULFATE 75 MG PO TABS: 75.0000 mg | ORAL_TABLET | Freq: Every day | ORAL | 3 refills | Status: AC

## 2024-05-30 NOTE — Patient Instructions (Addendum)
 Medication Instructions:  Your physician recommends that you continue on your current medications as directed. Please refer to the Current Medication list given to you today.  *If you need a refill on your cardiac medications before your next appointment, please call your pharmacy*  Lab Work: LIPID (Today) If you have labs (blood work) drawn today and your tests are completely normal, you will receive your results only by: MyChart Message (if you have MyChart) OR A paper copy in the mail If you have any lab test that is abnormal or we need to change your treatment, we will call you to review the results.   Follow-Up: At [facility], you and your health needs are our priority.  As part of our continuing mission to provide you with exceptional heart care, our providers are all part of one team.  This team includes your primary Cardiologist (physician) and Advanced Practice Providers or APPs (Physician Assistants and Nurse Practitioners) who all work together to provide you with the care you need, when you need it.  Your next appointment:   6 month(s)  Provider:   Gordy Bergamo, MD    We recommend signing up for the patient portal called MyChart.  Sign up information is provided on this After Visit Summary.  MyChart is used to connect with patients for Virtual Visits (Telemedicine).  Patients are able to view lab/test results, encounter notes, upcoming appointments, etc.  Non-urgent messages can be sent to your provider as well.   To learn more about what you can do with MyChart, go to [email or web address].   Other Instructions   Your provider has referred you to the pharmacist to discuss cholesterol medication options.

## 2024-05-30 NOTE — Progress Notes (Signed)
 " Cardiology Office Note   Date:  05/30/2024  ID:  Raymond Stanley, Raymond Stanley 11/21/1945, MRN [id number] PCP: Charlott Dorn LABOR, MD  [facility] Providers Cardiologist:  Gordy Bergamo, MD   History of Present Illness Raymond Stanley is a 78 y.o. male with a past medical history of CAD/STEMI, GERD, hypertension, hyperlipidemia.  Presents today for 74-month follow-up appointment.  Patient previously had STEMI in 01/2023.  Emergent cardiac catheterization on 02/23/2023 showed that the culprit lesion was due to total mid-distal PDA occlusion and dominant RCA.  This was treated with successful PCI with DES.  There was also moderate disease in the LAD and mild disease in the circumflex which have been managed medically.  Echocardiogram in 02/2024 showed EF 45-50%, grade 1 DD, normal RV systolic function, aortic sclerosis without stenosis.  Today, patient presents for an overdue follow-up appointment.  Tells me that the main reason he came in was because insurance told him they were going to stop covering his Brilinta .  He denies having any cardiac symptoms.  Denies chest pain, dizziness, syncope, near syncope, lower extremity swelling.  He has episodes of shortness of breath.  Tells me that in the past he had significant shortness of breath.  He was found to have a large hiatal hernia which was repaired in April 2025.  Since having this repaired, his breathing has significantly improved.  He denies cough, orthopnea.  Is compliant with his medications.  His blood pressure is elevated today in clinic.  He tells me that his blood pressure is usually elevated when he comes to provider offices.  He checks his blood pressure at home regularly.  It is often in the 130s-80s.  Tells me that in the summer, he did not often drink as much water as he should.  In these instances, he would actually have low blood pressure.  He stays active in his everyday life by doing work outside.  He has a daughter who is a engineer, civil (consulting) and  another daughter who is a librarian, academic.  They help him manage his medical needs.  He stopped taking simvastatin  about a month ago due to shoulder pain.  Since stopping simvastatin , his shoulder pain has resolved.  Interested in starting Repatha.   Studies Reviewed Cardiac Studies & Procedures   ______________________________________________________________________________________________ CARDIAC CATHETERIZATION  CARDIAC CATHETERIZATION 02/23/2023  Conclusion   Prox LAD to Mid LAD lesion is 50% stenosed.   Mid LAD lesion is 60% stenosed.   1st Sept lesion is 50% stenosed.   Ost Cx to Prox Cx lesion is 30% stenosed.   Mid Cx lesion is 40% stenosed.   3rd Mrg lesion is 40% stenosed.   RPDA-2 lesion is 100% stenosed.   Dist RCA lesion is 30% stenosed.   RPDA-1 lesion is 30% stenosed.   Mid RCA lesion is 85% stenosed.   Prox RCA to Mid RCA lesion is 75% stenosed.   Ost RCA lesion is 20% stenosed.   A drug-eluting stent was successfully placed.   Post intervention, there is a 80% residual stenosis.   Post intervention, there is a 0% residual stenosis.   Post intervention, there is a 0% residual stenosis.   Recommend uninterrupted dual antiplatelet therapy with Aspirin  81mg  daily and Ticagrelor  90mg  twice daily for a minimum of 12 months (ACS-Class I recommendation).  Acute ST segment elevation myocardial infarction secondary to total mid distal PDA occlusion in a dominant RCA with high-grade mid stenoses.  Severe multivessel coronary calcification with 50 and 60% proximal  LAD stenoses, irregularity of the left circumflex vessel with 30 to 40% stenoses.  Dominant RCA with posterior takeoff with 20% ostial narrowing followed by diffuse 75% to mid stenoses with 85% focal eccentric stenosis before the acute margin, 30 and 40% distal stenoses with total occlusion of the mid PDA.  Successful PCI to the RCA with PTCA of the totally occluded mid distal PDA with restoration of TIMI-3 flow and diffusely  narrowed 80% small caliber vessel stenting to the apex.  Successful PCI to the segmental 75 and 85% mid RCA stenoses with ultimate insertion of a 3.5 x 38 mm Onyx frontier stent postdilated to 3.78 mm with the stenoses being reduced to 0%.  Difficult procedure secondary to aorta takeoff and posterior takeoff of the RCA.  LVEDP 23 mmHg  RECOMMENDATION: DAPT with aspirin /Brilinta  for minimum of 1 year.  Medical therapy for concomitant CAD.  Will try to rechallenge statin, if intolerant consider PCSK9 inhibition.  2D echo Doppler study.  Guideline directed medical therapy for HFrEF.  Findings Coronary Findings Diagnostic  Dominance: Right  Left Anterior Descending There is mild diffuse disease throughout the vessel. Prox LAD to Mid LAD lesion is 50% stenosed. Mid LAD lesion is 60% stenosed.  First Diagonal Branch Vessel is small in size.  First Septal Branch 1st Sept lesion is 50% stenosed.  Left Circumflex There is mild diffuse disease throughout the vessel. Ost Cx to Prox Cx lesion is 30% stenosed. Mid Cx lesion is 40% stenosed.  Third Obtuse Marginal Branch There is mild disease in the vessel. 3rd Mrg lesion is 40% stenosed.  Right Coronary Artery Ost RCA lesion is 20% stenosed. Prox RCA to Mid RCA lesion is 75% stenosed. Mid RCA lesion is 85% stenosed. Dist RCA lesion is 30% stenosed.  Right Posterior Descending Artery RPDA-1 lesion is 30% stenosed. RPDA-2 lesion is 100% stenosed.  Intervention  Prox RCA to Mid RCA lesion Stent (Also treats lesions: Mid RCA) Lesion crossed with guidewire. Pre-stent angioplasty was performed. A drug-eluting stent was successfully placed. Stent strut is well apposed. Post-Intervention Lesion Assessment The intervention was successful. Pre-interventional TIMI flow is 3. Post-intervention TIMI flow is 3. There is a 0% residual stenosis post intervention.  Mid RCA lesion Stent (Also treats lesions: Prox RCA to Mid RCA) See details in  Prox RCA to Mid RCA lesion. Post-Intervention Lesion Assessment The intervention was successful. Pre-interventional TIMI flow is 3. Post-intervention TIMI flow is 3. There is a 0% residual stenosis post intervention.  RPDA-2 lesion Angioplasty Post-Intervention Lesion Assessment The intervention was successful. Pre-interventional TIMI flow is 0. Post-intervention TIMI flow is 3. No complications occurred at this lesion. There is a 80% residual stenosis post intervention.   STRESS TESTS  EXERCISE TOLERANCE TEST (ETT) 01/31/2018  Interpretation Summary  There was no ST segment deviation noted during stress.  The patient walked for 9 minutes and 31 seconds on a standard Bruce protocol treadmill test. He achieved a peak heart rate of 164 which is 110% predicted maximal heart rate.  The blood pressure response to exercise was normal.  There were no ST or T wave changes to suggest ischemia.  This is interpreted as a negative exercise test. There is no evidence of ischemia.   ECHOCARDIOGRAM  ECHOCARDIOGRAM COMPLETE 03/25/2023  Narrative ECHOCARDIOGRAM REPORT    Patient Name:   RENN DIROCCO Date of Exam: 03/25/2023 Medical Rec #:  [id number]     Height:       69.0 in Accession #:    [id number]  Weight:       187.6 lb Date of Birth:  [birth year]    BSA:          2.011 m Patient Age:    76 years      BP:           150/90 mmHg Patient Gender: M             HR:           71 bpm. Exam Location:  [address]  Procedure: 2D Echo, 3D Echo, Cardiac Doppler, Color Doppler and Strain Analysis  Indications:    R06.00 Dyspnea  History:        Patient has no prior history of Echocardiogram examinations. STENT..; Risk Factors:Hypertension and Dyslipidemia. STEMI.  Sonographer:    Carl Rodgers-Jones RDCS Referring Phys: [id number] GORDY BERGAMO  IMPRESSIONS   1. Left ventricular ejection fraction, by estimation, is 45 to 50%. The left ventricle has mildly decreased function. The left  ventricle demonstrates regional wall motion abnormalities with inferoseptal and inferior hypokinesis. Left ventricular diastolic parameters are consistent with Grade I diastolic dysfunction (impaired relaxation). 2. Right ventricular systolic function is normal. The right ventricular size is mildly enlarged. There is normal pulmonary artery systolic pressure. The estimated right ventricular systolic pressure is 27.6 mmHg. 3. Left atrial size was mild to moderately dilated. 4. Right atrial size was mildly dilated. 5. The mitral valve is normal in structure. Trivial mitral valve regurgitation. No evidence of mitral stenosis. 6. The aortic valve is tricuspid. There is moderate calcification of the aortic valve. Aortic valve regurgitation is not visualized. Aortic valve sclerosis/calcification is present, without any evidence of aortic stenosis. 7. The inferior vena cava is normal in size with greater than 50% respiratory variability, suggesting right atrial pressure of 3 mmHg.  FINDINGS Left Ventricle: Left ventricular ejection fraction, by estimation, is 45 to 50%. The left ventricle has mildly decreased function. The left ventricle demonstrates regional wall motion abnormalities. The left ventricular internal cavity size was normal in size. There is no left ventricular hypertrophy. Left ventricular diastolic parameters are consistent with Grade I diastolic dysfunction (impaired relaxation).  Right Ventricle: The right ventricular size is mildly enlarged. No increase in right ventricular wall thickness. Right ventricular systolic function is normal. There is normal pulmonary artery systolic pressure. The tricuspid regurgitant velocity is 2.48 m/s, and with an assumed right atrial pressure of 3 mmHg, the estimated right ventricular systolic pressure is 27.6 mmHg.  Left Atrium: Left atrial size was mild to moderately dilated.  Right Atrium: Right atrial size was mildly dilated.  Pericardium: There is  no evidence of pericardial effusion.  Mitral Valve: The mitral valve is normal in structure. Mild mitral annular calcification. Trivial mitral valve regurgitation. No evidence of mitral valve stenosis.  Tricuspid Valve: The tricuspid valve is normal in structure. Tricuspid valve regurgitation is trivial.  The aortic valve is tricuspid. There is moderate calcification of the aortic valve. Aortic valve regurgitation is not visualized. Aortic valve sclerosis/calcification is present, without any evidence of aortic stenosis. Pulmonic Valve: The pulmonic valve was normal in structure. Pulmonic valve regurgitation is mild.  Aorta: The aortic root is normal in size and structure.  Venous: The inferior vena cava is normal in size with greater than 50% respiratory variability, suggesting right atrial pressure of 3 mmHg.  IAS/Shunts: No atrial level shunt detected by color flow Doppler.   LEFT VENTRICLE PLAX 2D LVIDd:         5.20 cm  Diastology LVIDs:         3.20 cm   LV e' medial:    5.82 cm/s LV PW:         0.80 cm   LV E/e' medial:  9.9 LV IVS:        0.80 cm   LV e' lateral:   8.70 cm/s LVOT diam:     2.40 cm   LV E/e' lateral: 6.6 LV SV:         74 LV SV Index:   37        2D Longitudinal Strain LVOT Area:     4.52 cm  2D Strain GLS (A2C):   -22.6 % 2D Strain GLS (A3C):   -22.8 % 2D Strain GLS (A4C):   -20.9 % 2D Strain GLS Avg:     -22.1 %  3D Volume EF: 3D EF:        52 % LV EDV:       184 ml LV ESV:       88 ml LV SV:        96 ml  RIGHT VENTRICLE RV Basal diam:  4.30 cm RV S prime:     17.45 cm/s TAPSE (M-mode): 3.2 cm  LEFT ATRIUM             Index        RIGHT ATRIUM           Index LA diam:        5.80 cm 2.88 cm/m   RA Area:     19.00 cm LA Vol (A2C):   49.8 ml 24.77 ml/m  RA Volume:   55.10 ml  27.41 ml/m LA Vol (A4C):   85.9 ml 42.72 ml/m LA Biplane Vol: 67.4 ml 33.52 ml/m AORTIC VALVE AV Area (Vmax):    1.88 cm AV Area (Vmean):   1.81 cm AV Area  (VTI):     2.06 cm AV Vmax:           176.00 cm/s AV Vmean:          114.000 cm/s AV VTI:            0.360 m AV Peak Grad:      12.4 mmHg AV Mean Grad:      6.0 mmHg LVOT Vmax:         73.20 cm/s LVOT Vmean:        45.700 cm/s LVOT VTI:          0.164 m LVOT/AV VTI ratio: 0.45  AORTA Ao Root diam: 3.50 cm Ao Asc diam:  3.70 cm  MITRAL VALVE               TRICUSPID VALVE MV Area (PHT): 3.29 cm    TR Peak grad:   24.6 mmHg MV Decel Time: 231 msec    TR Vmax:        248.00 cm/s MV E velocity: 57.70 cm/s MV A velocity: 65.80 cm/s  SHUNTS MV E/A ratio:  0.88        Systemic VTI:  0.16 m Systemic Diam: 2.40 cm  Dalton McleanMD Electronically signed by Ezra Kanner Signature Date/Time: 03/25/2023/8:36:04 AM    Final          ______________________________________________________________________________________________       Risk Assessment/Calculations   HYPERTENSION CONTROL Vitals:   05/30/24 1031 05/30/24 1033  BP: (!) 173/91 (!) 172/84    The patient's blood pressure is elevated above target today.  In  order to address the patient's elevated BP: Blood pressure will be monitored at home to determine if medication changes need to be made.; The blood pressure is usually elevated in clinic.  Blood pressures monitored at home have been optimal.          Physical Exam VS:  BP (!) 172/84 (BP Location: Left Arm, Patient Position: Sitting, Cuff Size: Normal)   Pulse 63   Resp 16   Ht 5' 9 (1.753 m)   Wt 178 lb (80.7 kg)   SpO2 98%   BMI 26.29 kg/m        Wt Readings from Last 3 Encounters:  05/30/24 178 lb (80.7 kg)  05/01/24 176 lb (79.8 kg)  04/19/24 175 lb (79.4 kg)    GEN: Well nourished, well developed in no acute distress. Sitting comfortably on the exam table  NECK: No JVD; No carotid bruits CARDIAC:  RRR. Faint systolic murmur at RUSB  RESPIRATORY:  Clear to auscultation without rales, wheezing or rhonchi. Normal WOB on room air   ABDOMEN:  Soft, non-tender, non-distended EXTREMITIES:  No edema in BLE; No deformity   ASSESSMENT AND PLAN  CAD - Patient had STEMI in 01/2023.  Underwent PCI with DES to the mid RCA-distal PDA.  There was also moderate disease in the LAD and mild disease in the circumflex which was managed medically - Patient denies chest pain or dyspnea on exertion.  No symptoms concerning for angina - Currently on DAPT with aspirin , Brilinta .  Tolerating this well.  As his STEMI was greater than 1 year ago, I will reach out to Dr. Ladona to discuss transition to monotherapy - Continue amlodipine  5 mg daily, metoprolol  succinate 25 mg daily - In the past, patient has been intolerant of Crestor and Lipitor.  He had been on simvastatin  for many years, recently developed shoulder pain and stopped it.  Pain has resolved since he stopped simvastatin  - Ordered fasting lipid panel today - Continue Zetia  10 mg daily - Refer to Pharm.D. to discuss Repatha  Hypertension - BP elevated today in clinic but has been well-controlled at home.  In the 130s/80s at home.  If he forgets to drink water or if the weather is very hot, he sometimes develops low blood pressure - Instructed patient to continue to monitor BP at home.  If he notices that his blood pressure is consistently high (over 140/90), instructed him to reach out to us  for med adjustments - Continue amlodipine  5 mg daily, losartan -hydrochlorothiazide 100-12.5 mg daily, metoprolol  supinate 25 mg daily  Hyperlipidemia - Lipid panel from 03/2023 showed LDL 65, HDL 42, triglycerides 95, total cholesterol 125 - In the past, patient was intolerant of Crestor and Lipitor..  More recently developed body aches with simvastatin  and stopped it - Ordered fasting lipid panel.  LFTs normal on 04/19/2024 - Continue Zetia  10 mg daily - Refer to Pharm.D. to discuss Repatha    Dispo: Follow up with Dr. Ladona in 6 months   Signed, Rollo FABIENE Louder, PA-C   "

## 2024-05-30 NOTE — Telephone Encounter (Signed)
 [id number]-cancelled-per test claim insurance prefers brand and Refill Payable on or after 07/01/24  Asked if brilinta  can be sent in as daw

## 2024-05-30 NOTE — Telephone Encounter (Signed)
 Vicci Rollo SAUNDERS, PA-C to [facility] Triage (Selected Message)     05/30/24  5:15 PM I talked to Dr. Ladona- stop ASA and Brilinta  and start plavix 75 mg daily    Thanks KJ   Left the information above over voicemail. Left call back number, told to call back on Friday with any questions and/or send MyChart message.   Also sent MyChart message. Medication sent to [facility]

## 2024-05-30 NOTE — Telephone Encounter (Signed)
 He was seen today by APP and referred to Pharm D to discuss Repatha. Will ask APP about RX (insurance prefers brand) as he may be transitioning to monotherapy (currently on DAPT with aspirin  and Brilinta ).

## 2024-05-30 NOTE — Telephone Encounter (Signed)
 Date of Study: 05/16/24  Interpretation: Mild OSA with AHI of 7.3 (4%), O2 desaturation <29min min. O2 nadir 87%.   Plan: Please let the patient know that he has mild sleep apnea.  We will discuss treatment plans during the next visit unless he wants to be started right on CPAP.  If he is willing to start on CPAP he can be started on 6-15 cm H2O of auto CPAP. - Maintain side sleep.  Sammi Fredericks, MD.

## 2024-06-01 ENCOUNTER — Ambulatory Visit: Payer: Self-pay | Admitting: Cardiology

## 2024-06-01 ENCOUNTER — Telehealth: Payer: Self-pay | Admitting: Cardiology

## 2024-06-01 MED ORDER — REPATHA SURECLICK 140 MG/ML ~~LOC~~ SOAJ: 140.0000 mg | SUBCUTANEOUS | 3 refills | Status: AC

## 2024-06-01 NOTE — Telephone Encounter (Signed)
 Spoke with pt regarding Dr. Godfrey explanation and suggestions. Pt was grateful for response. Pt agreeable to take Plavix and stop Brilinta  and Aspirin . Prescription for plavix has already been sent. Pt aware that Repatha has also been sent in. Pt will let us  know if he has any issues administering the medication. Pt verbalized understanding. All questions if any were answered.

## 2024-06-01 NOTE — Telephone Encounter (Signed)
 Copied from CRM #[id number]. Topic: Clinical - Lab/Test Results >> Jun 01, 2024  9:11 AM Corean SAUNDERS wrote: Reason for CRM: Patient missed a call from Ashlyn at [facility] regarding his sleep study results. CAL did not answer, please call patient back.   Called and spoke with the pt regarding sleep study results. Pt is aware and verbalized understanding. Pt stated he would not like to start CPAP and discuss at ov. Offered pt sooner appt with Dr. Pawar and the pt stated he will keep his appts as scheduled.  Pt states he is sleep good since taking Lunesta  and feels overall better. Pt states med works well for him and would like refill through [facility]. Pt is requesting 90 day supply.  Please advise Dr. Pawar

## 2024-06-01 NOTE — Telephone Encounter (Signed)
 Patient returned call for lab results.  He said he doesn't do MyChart.   Patient states his insurance will not pay for the name brand Brilinta  but they will pay for the generic.  He wants to make sure Dr. Ladona knows this, before prescribing Plavix.

## 2024-06-01 NOTE — Telephone Encounter (Signed)
 Spoke with Raymond Stanley regarding his medications. Raymond Stanley stated he does not understanding why he is switching from Brilinta  and Aspirin  to Plavix. Raymond Stanley stated his insurance will cover the generic of Brilinta  so he would be able to continue taking it if Ganji would rather him do so. Raymond Stanley also stated he does not understand why he must see a pharmacist to start Repatha. Raymond Stanley stated he is not squeamish about needles and has two healthcare professionals in his family that can help him learn how to administer the injection. Raymond Stanley is hoping the prescription can be sent and he will not have to wait for an appointment with a pharmacist. Raymond Stanley was told his questions would be sent to Dr. Ladona and Rollo Louder, PA-C. Raymond Stanley verbalized understanding.

## 2024-06-01 NOTE — Telephone Encounter (Signed)
 ATC X1. LMTCB

## 2024-06-01 NOTE — Telephone Encounter (Signed)
 Brilinta  can be continued at 60 mg BID dose, or to keep simple just Plavix without ASA is most appropriate and most Cardiologists would recommend this to reduce risk of bleeding and also cheaper and there is long term data with Plavix safety.  Yes we can Rx Repatha to his pharmacy and he can administer this himself unless he needs PA then easier through Clinical pharmacist.

## 2024-06-04 MED ORDER — ESZOPICLONE 2 MG PO TABS: 2.0000 mg | ORAL_TABLET | Freq: Every evening | ORAL | 0 refills | Status: DC | PRN

## 2024-06-04 NOTE — Addendum Note (Signed)
 Addended by: Sary Bogie D on: 06/04/2024 04:32 PM   Modules accepted: Orders

## 2024-06-04 NOTE — Telephone Encounter (Signed)
Pt aware, nothing further needed.  ?

## 2024-07-04 ENCOUNTER — Other Ambulatory Visit: Payer: Self-pay

## 2024-07-19 ENCOUNTER — Ambulatory Visit: Admitting: Pulmonary Disease

## 2024-07-30 ENCOUNTER — Ambulatory Visit
